# Patient Record
Sex: Male | Born: 1935 | Race: White | Hispanic: No | Marital: Married | Smoking: Former smoker
Health system: Southern US, Community
[De-identification: ages and names within clinical notes are randomized; demographics above are authoritative.]

## PROBLEM LIST (undated history)

## (undated) DIAGNOSIS — G894 Chronic pain syndrome: Secondary | ICD-10-CM

## (undated) DIAGNOSIS — K5790 Diverticulosis of intestine, part unspecified, without perforation or abscess without bleeding: Secondary | ICD-10-CM

## (undated) DIAGNOSIS — I679 Cerebrovascular disease, unspecified: Secondary | ICD-10-CM

## (undated) DIAGNOSIS — M47817 Spondylosis without myelopathy or radiculopathy, lumbosacral region: Secondary | ICD-10-CM

## (undated) DIAGNOSIS — N2889 Other specified disorders of kidney and ureter: Secondary | ICD-10-CM

## (undated) DIAGNOSIS — D649 Anemia, unspecified: Secondary | ICD-10-CM

## (undated) DIAGNOSIS — K219 Gastro-esophageal reflux disease without esophagitis: Secondary | ICD-10-CM

## (undated) DIAGNOSIS — N400 Enlarged prostate without lower urinary tract symptoms: Secondary | ICD-10-CM

## (undated) DIAGNOSIS — I739 Peripheral vascular disease, unspecified: Secondary | ICD-10-CM

## (undated) DIAGNOSIS — K449 Diaphragmatic hernia without obstruction or gangrene: Secondary | ICD-10-CM

## (undated) DIAGNOSIS — I8390 Asymptomatic varicose veins of unspecified lower extremity: Secondary | ICD-10-CM

## (undated) DIAGNOSIS — F329 Major depressive disorder, single episode, unspecified: Secondary | ICD-10-CM

## (undated) DIAGNOSIS — I639 Cerebral infarction, unspecified: Secondary | ICD-10-CM

## (undated) DIAGNOSIS — I10 Essential (primary) hypertension: Secondary | ICD-10-CM

## (undated) DIAGNOSIS — M545 Low back pain, unspecified: Secondary | ICD-10-CM

## (undated) DIAGNOSIS — I4510 Unspecified right bundle-branch block: Secondary | ICD-10-CM

## (undated) DIAGNOSIS — J449 Chronic obstructive pulmonary disease, unspecified: Secondary | ICD-10-CM

## (undated) DIAGNOSIS — F32A Depression, unspecified: Secondary | ICD-10-CM

## (undated) DIAGNOSIS — M1990 Unspecified osteoarthritis, unspecified site: Secondary | ICD-10-CM

## (undated) DIAGNOSIS — I4891 Unspecified atrial fibrillation: Secondary | ICD-10-CM

## (undated) DIAGNOSIS — N2581 Secondary hyperparathyroidism of renal origin: Secondary | ICD-10-CM

## (undated) DIAGNOSIS — E875 Hyperkalemia: Secondary | ICD-10-CM

## (undated) DIAGNOSIS — I5032 Chronic diastolic (congestive) heart failure: Secondary | ICD-10-CM

## (undated) DIAGNOSIS — E785 Hyperlipidemia, unspecified: Secondary | ICD-10-CM

## (undated) DIAGNOSIS — N259 Disorder resulting from impaired renal tubular function, unspecified: Secondary | ICD-10-CM

## (undated) DIAGNOSIS — N183 Chronic kidney disease, stage 3 (moderate): Secondary | ICD-10-CM

## (undated) HISTORY — DX: Asymptomatic varicose veins of unspecified lower extremity: I83.90

## (undated) HISTORY — DX: Chronic obstructive pulmonary disease, unspecified: J44.9

## (undated) HISTORY — DX: Low back pain, unspecified: M54.50

## (undated) HISTORY — DX: Low back pain: M54.5

## (undated) HISTORY — DX: Cerebrovascular disease, unspecified: I67.9

## (undated) HISTORY — DX: Unspecified osteoarthritis, unspecified site: M19.90

## (undated) HISTORY — DX: Peripheral vascular disease, unspecified: I73.9

## (undated) HISTORY — DX: Unspecified atrial fibrillation: I48.91

## (undated) HISTORY — DX: Diaphragmatic hernia without obstruction or gangrene: K44.9

## (undated) HISTORY — DX: Cerebral infarction, unspecified: I63.9

## (undated) HISTORY — DX: Spondylosis without myelopathy or radiculopathy, lumbosacral region: M47.817

## (undated) HISTORY — DX: Chronic pain syndrome: G89.4

## (undated) HISTORY — DX: Disorder resulting from impaired renal tubular function, unspecified: N25.9

## (undated) HISTORY — DX: Anemia, unspecified: D64.9

## (undated) HISTORY — DX: Essential (primary) hypertension: I10

## (undated) HISTORY — DX: Hyperkalemia: E87.5

## (undated) HISTORY — DX: Major depressive disorder, single episode, unspecified: F32.9

## (undated) HISTORY — DX: Hyperlipidemia, unspecified: E78.5

## (undated) HISTORY — DX: Depression, unspecified: F32.A

## (undated) HISTORY — DX: Gastro-esophageal reflux disease without esophagitis: K21.9

## (undated) HISTORY — DX: Secondary hyperparathyroidism of renal origin: N25.81

---

## 2001-12-05 ENCOUNTER — Encounter: Payer: Self-pay | Admitting: Gastroenterology

## 2002-08-12 ENCOUNTER — Encounter: Payer: Self-pay | Admitting: Endocrinology

## 2002-08-12 ENCOUNTER — Ambulatory Visit (HOSPITAL_COMMUNITY): Admission: RE | Admit: 2002-08-12 | Discharge: 2002-08-12 | Payer: Self-pay | Admitting: Endocrinology

## 2004-03-18 ENCOUNTER — Ambulatory Visit: Payer: Self-pay | Admitting: Endocrinology

## 2004-07-02 ENCOUNTER — Ambulatory Visit: Payer: Self-pay | Admitting: Internal Medicine

## 2004-07-19 ENCOUNTER — Ambulatory Visit: Payer: Self-pay | Admitting: Endocrinology

## 2004-07-23 ENCOUNTER — Ambulatory Visit: Payer: Self-pay | Admitting: Endocrinology

## 2004-08-09 ENCOUNTER — Ambulatory Visit: Payer: Self-pay | Admitting: Critical Care Medicine

## 2004-09-22 ENCOUNTER — Ambulatory Visit: Payer: Self-pay | Admitting: Critical Care Medicine

## 2004-09-22 LAB — PULMONARY FUNCTION TEST

## 2004-11-12 ENCOUNTER — Ambulatory Visit: Payer: Self-pay | Admitting: Endocrinology

## 2004-12-20 ENCOUNTER — Ambulatory Visit: Payer: Self-pay | Admitting: Endocrinology

## 2004-12-24 ENCOUNTER — Ambulatory Visit: Payer: Self-pay | Admitting: Endocrinology

## 2005-07-20 ENCOUNTER — Ambulatory Visit: Payer: Self-pay | Admitting: Endocrinology

## 2005-07-26 ENCOUNTER — Ambulatory Visit: Payer: Self-pay | Admitting: Endocrinology

## 2005-09-05 ENCOUNTER — Ambulatory Visit: Payer: Self-pay

## 2005-09-08 ENCOUNTER — Ambulatory Visit: Payer: Self-pay | Admitting: Cardiology

## 2005-09-18 ENCOUNTER — Inpatient Hospital Stay (HOSPITAL_COMMUNITY): Admission: EM | Admit: 2005-09-18 | Discharge: 2005-09-21 | Payer: Self-pay | Admitting: Emergency Medicine

## 2005-09-18 ENCOUNTER — Ambulatory Visit: Payer: Self-pay | Admitting: Internal Medicine

## 2005-09-19 ENCOUNTER — Encounter (INDEPENDENT_AMBULATORY_CARE_PROVIDER_SITE_OTHER): Payer: Self-pay | Admitting: Specialist

## 2005-09-19 ENCOUNTER — Encounter: Payer: Self-pay | Admitting: Internal Medicine

## 2005-09-22 ENCOUNTER — Ambulatory Visit: Payer: Self-pay | Admitting: Internal Medicine

## 2005-09-28 ENCOUNTER — Ambulatory Visit: Payer: Self-pay | Admitting: Endocrinology

## 2005-09-29 ENCOUNTER — Ambulatory Visit: Payer: Self-pay | Admitting: Cardiology

## 2005-10-10 ENCOUNTER — Ambulatory Visit: Payer: Self-pay | Admitting: Internal Medicine

## 2005-10-28 ENCOUNTER — Ambulatory Visit: Payer: Self-pay

## 2005-10-31 ENCOUNTER — Ambulatory Visit: Payer: Self-pay | Admitting: Endocrinology

## 2005-11-07 ENCOUNTER — Ambulatory Visit: Payer: Self-pay | Admitting: Cardiology

## 2005-11-14 ENCOUNTER — Ambulatory Visit: Payer: Self-pay | Admitting: Endocrinology

## 2005-12-02 ENCOUNTER — Ambulatory Visit: Payer: Self-pay | Admitting: Internal Medicine

## 2006-01-11 ENCOUNTER — Ambulatory Visit: Payer: Self-pay | Admitting: Endocrinology

## 2006-01-23 ENCOUNTER — Ambulatory Visit: Payer: Self-pay | Admitting: Endocrinology

## 2006-02-22 ENCOUNTER — Ambulatory Visit: Payer: Self-pay | Admitting: Endocrinology

## 2006-03-20 ENCOUNTER — Ambulatory Visit: Payer: Self-pay | Admitting: Internal Medicine

## 2006-03-27 ENCOUNTER — Ambulatory Visit: Payer: Self-pay | Admitting: Endocrinology

## 2006-05-08 ENCOUNTER — Ambulatory Visit: Payer: Self-pay | Admitting: Endocrinology

## 2006-07-31 ENCOUNTER — Ambulatory Visit: Payer: Self-pay | Admitting: Endocrinology

## 2006-07-31 LAB — CONVERTED CEMR LAB
Albumin: 4 g/dL (ref 3.5–5.2)
Alkaline Phosphatase: 71 units/L (ref 39–117)
BUN: 26 mg/dL — ABNORMAL HIGH (ref 6–23)
Basophils Absolute: 0 10*3/uL (ref 0.0–0.1)
Eosinophils Absolute: 0.4 10*3/uL (ref 0.0–0.6)
GFR calc non Af Amer: 57 mL/min
HCT: 36.2 % — ABNORMAL LOW (ref 39.0–52.0)
Iron: 59 ug/dL (ref 42–165)
MCHC: 35.2 g/dL (ref 30.0–36.0)
MCV: 95.9 fL (ref 78.0–100.0)
Monocytes Absolute: 0.7 10*3/uL (ref 0.2–0.7)
Monocytes Relative: 12.8 % — ABNORMAL HIGH (ref 3.0–11.0)
Neutrophils Relative %: 50.7 % (ref 43.0–77.0)
Potassium: 4.7 meq/L (ref 3.5–5.1)
RBC: 3.77 M/uL — ABNORMAL LOW (ref 4.22–5.81)
Sodium: 143 meq/L (ref 135–145)
TSH: 0.63 microintl units/mL (ref 0.35–5.50)
Vitamin B-12: 497 pg/mL (ref 211–911)

## 2006-08-10 ENCOUNTER — Ambulatory Visit: Payer: Self-pay

## 2006-08-15 ENCOUNTER — Encounter: Admission: RE | Admit: 2006-08-15 | Discharge: 2006-08-15 | Payer: Self-pay | Admitting: Orthopedic Surgery

## 2006-09-21 ENCOUNTER — Encounter: Admission: RE | Admit: 2006-09-21 | Discharge: 2006-09-21 | Payer: Self-pay | Admitting: Orthopedic Surgery

## 2006-10-16 ENCOUNTER — Ambulatory Visit: Payer: Self-pay | Admitting: Endocrinology

## 2006-11-03 ENCOUNTER — Ambulatory Visit: Payer: Self-pay | Admitting: Cardiology

## 2006-11-03 LAB — CONVERTED CEMR LAB
Basophils Absolute: 0 10*3/uL (ref 0.0–0.1)
Basophils Relative: 0.6 % (ref 0.0–1.0)
CO2: 27 meq/L (ref 19–32)
Chloride: 109 meq/L (ref 96–112)
Creatinine, Ser: 1.2 mg/dL (ref 0.4–1.5)
Eosinophils Relative: 9.9 % — ABNORMAL HIGH (ref 0.0–5.0)
Glucose, Bld: 101 mg/dL — ABNORMAL HIGH (ref 70–99)
HCT: 35.1 % — ABNORMAL LOW (ref 39.0–52.0)
Hemoglobin: 11.9 g/dL — ABNORMAL LOW (ref 13.0–17.0)
Neutrophils Relative %: 42.6 % — ABNORMAL LOW (ref 43.0–77.0)
RBC: 3.6 M/uL — ABNORMAL LOW (ref 4.22–5.81)
RDW: 12 % (ref 11.5–14.6)
Sodium: 140 meq/L (ref 135–145)
WBC: 5.1 10*3/uL (ref 4.5–10.5)

## 2006-11-20 DIAGNOSIS — I252 Old myocardial infarction: Secondary | ICD-10-CM

## 2006-11-20 DIAGNOSIS — I10 Essential (primary) hypertension: Secondary | ICD-10-CM | POA: Insufficient documentation

## 2006-11-20 DIAGNOSIS — I739 Peripheral vascular disease, unspecified: Secondary | ICD-10-CM

## 2006-11-20 DIAGNOSIS — J4489 Other specified chronic obstructive pulmonary disease: Secondary | ICD-10-CM | POA: Insufficient documentation

## 2006-11-20 DIAGNOSIS — J449 Chronic obstructive pulmonary disease, unspecified: Secondary | ICD-10-CM

## 2006-12-21 ENCOUNTER — Ambulatory Visit: Payer: Self-pay

## 2006-12-21 ENCOUNTER — Encounter: Payer: Self-pay | Admitting: Cardiology

## 2006-12-27 ENCOUNTER — Ambulatory Visit: Payer: Self-pay | Admitting: Cardiology

## 2006-12-28 ENCOUNTER — Ambulatory Visit: Payer: Self-pay | Admitting: Endocrinology

## 2006-12-29 ENCOUNTER — Ambulatory Visit: Payer: Self-pay | Admitting: Cardiology

## 2006-12-29 LAB — CONVERTED CEMR LAB
AST: 20 units/L (ref 0–37)
Albumin: 3.5 g/dL (ref 3.5–5.2)
BUN: 24 mg/dL — ABNORMAL HIGH (ref 6–23)
Chloride: 107 meq/L (ref 96–112)
Cholesterol: 185 mg/dL (ref 0–200)
Direct LDL: 78.6 mg/dL
GFR calc non Af Amer: 69 mL/min
Sodium: 139 meq/L (ref 135–145)
Total CHOL/HDL Ratio: 3.7
VLDL: 73 mg/dL — ABNORMAL HIGH (ref 0–40)

## 2007-02-05 ENCOUNTER — Ambulatory Visit: Payer: Self-pay | Admitting: Internal Medicine

## 2007-03-01 ENCOUNTER — Telehealth (INDEPENDENT_AMBULATORY_CARE_PROVIDER_SITE_OTHER): Payer: Self-pay | Admitting: *Deleted

## 2007-03-01 ENCOUNTER — Ambulatory Visit: Payer: Self-pay | Admitting: Endocrinology

## 2007-06-11 ENCOUNTER — Telehealth (INDEPENDENT_AMBULATORY_CARE_PROVIDER_SITE_OTHER): Payer: Self-pay | Admitting: *Deleted

## 2007-06-12 ENCOUNTER — Ambulatory Visit: Payer: Self-pay | Admitting: Cardiology

## 2007-06-14 ENCOUNTER — Ambulatory Visit: Payer: Self-pay | Admitting: Endocrinology

## 2007-06-14 ENCOUNTER — Ambulatory Visit: Payer: Self-pay | Admitting: Cardiology

## 2007-06-14 LAB — CONVERTED CEMR LAB
Bilirubin, Direct: 0.1 mg/dL (ref 0.0–0.3)
Cholesterol: 215 mg/dL (ref 0–200)
Direct LDL: 86.1 mg/dL
GFR calc Af Amer: 63 mL/min
GFR calc non Af Amer: 52 mL/min
Glucose, Bld: 102 mg/dL — ABNORMAL HIGH (ref 70–99)
Sodium: 141 meq/L (ref 135–145)
Total CHOL/HDL Ratio: 2.8
Triglycerides: 410 mg/dL (ref 0–149)

## 2007-06-25 ENCOUNTER — Ambulatory Visit: Payer: Self-pay | Admitting: Cardiovascular Disease

## 2007-07-06 ENCOUNTER — Telehealth: Payer: Self-pay | Admitting: Endocrinology

## 2007-08-13 ENCOUNTER — Ambulatory Visit: Payer: Self-pay | Admitting: Cardiology

## 2007-08-13 LAB — CONVERTED CEMR LAB
ALT: 11 units/L (ref 0–53)
AST: 17 units/L (ref 0–37)
Albumin: 3.4 g/dL — ABNORMAL LOW (ref 3.5–5.2)
HDL: 71.8 mg/dL (ref >39.0)
Triglycerides: 181 mg/dL — ABNORMAL HIGH (ref 0–149)

## 2007-08-20 ENCOUNTER — Ambulatory Visit: Payer: Self-pay

## 2007-09-26 ENCOUNTER — Ambulatory Visit: Payer: Self-pay | Admitting: Endocrinology

## 2007-09-26 DIAGNOSIS — R7309 Other abnormal glucose: Secondary | ICD-10-CM

## 2007-09-26 DIAGNOSIS — M25559 Pain in unspecified hip: Secondary | ICD-10-CM

## 2007-09-26 DIAGNOSIS — D649 Anemia, unspecified: Secondary | ICD-10-CM | POA: Insufficient documentation

## 2007-09-27 ENCOUNTER — Encounter (INDEPENDENT_AMBULATORY_CARE_PROVIDER_SITE_OTHER): Payer: Self-pay | Admitting: *Deleted

## 2007-10-12 ENCOUNTER — Encounter: Payer: Self-pay | Admitting: Endocrinology

## 2007-11-15 ENCOUNTER — Ambulatory Visit: Payer: Self-pay | Admitting: Cardiology

## 2007-11-15 LAB — CONVERTED CEMR LAB
Albumin: 3.7 g/dL (ref 3.5–5.2)
Alkaline Phosphatase: 68 units/L (ref 39–117)
Bilirubin, Direct: 0.1 mg/dL (ref 0.0–0.3)
HDL: 67.4 mg/dL (ref >39.0)
Total CHOL/HDL Ratio: 3.1
Total Protein: 6.9 g/dL (ref 6.0–8.3)
VLDL: 50 mg/dL — ABNORMAL HIGH (ref 0–40)

## 2007-11-22 ENCOUNTER — Ambulatory Visit: Payer: Self-pay | Admitting: Cardiovascular Disease

## 2008-01-08 HISTORY — PX: LUMBAR FUSION: SHX111

## 2008-01-18 ENCOUNTER — Ambulatory Visit: Payer: Self-pay | Admitting: Cardiology

## 2008-01-18 LAB — CONVERTED CEMR LAB
ALT: 15 units/L (ref 0–53)
AST: 20 units/L (ref 0–37)
Albumin: 3.9 g/dL (ref 3.5–5.2)
Cholesterol: 210 mg/dL (ref 0–200)
Direct LDL: 86.2 mg/dL
HDL: 72.7 mg/dL (ref >39.0)
Total Bilirubin: 0.6 mg/dL (ref 0.3–1.2)
Total CHOL/HDL Ratio: 2.9
Triglycerides: 371 mg/dL (ref 0–149)

## 2008-01-24 ENCOUNTER — Ambulatory Visit: Payer: Self-pay | Admitting: Cardiology

## 2008-02-07 DIAGNOSIS — I679 Cerebrovascular disease, unspecified: Secondary | ICD-10-CM

## 2008-02-07 HISTORY — DX: Cerebrovascular disease, unspecified: I67.9

## 2008-02-21 ENCOUNTER — Ambulatory Visit: Payer: Self-pay | Admitting: Internal Medicine

## 2008-02-21 ENCOUNTER — Ambulatory Visit: Payer: Self-pay | Admitting: Cardiology

## 2008-02-25 ENCOUNTER — Telehealth (INDEPENDENT_AMBULATORY_CARE_PROVIDER_SITE_OTHER): Payer: Self-pay | Admitting: *Deleted

## 2008-02-28 ENCOUNTER — Ambulatory Visit: Payer: Self-pay | Admitting: Endocrinology

## 2008-02-28 DIAGNOSIS — E7800 Pure hypercholesterolemia, unspecified: Secondary | ICD-10-CM | POA: Insufficient documentation

## 2008-02-28 DIAGNOSIS — M479 Spondylosis, unspecified: Secondary | ICD-10-CM | POA: Insufficient documentation

## 2008-03-06 ENCOUNTER — Ambulatory Visit: Payer: Self-pay | Admitting: Endocrinology

## 2008-03-08 LAB — CONVERTED CEMR LAB
Cholesterol: 163 mg/dL (ref 0–200)
VLDL: 35 mg/dL (ref 0–40)

## 2008-03-25 ENCOUNTER — Ambulatory Visit: Payer: Self-pay | Admitting: Cardiology

## 2008-03-25 LAB — CONVERTED CEMR LAB
BUN: 28 mg/dL — ABNORMAL HIGH (ref 6–23)
CO2: 23 meq/L (ref 19–32)
GFR calc Af Amer: 54 mL/min
Glucose, Bld: 92 mg/dL (ref 70–99)
Potassium: 4.7 meq/L (ref 3.5–5.1)
Sodium: 137 meq/L (ref 135–145)

## 2008-04-11 ENCOUNTER — Ambulatory Visit: Payer: Self-pay | Admitting: Internal Medicine

## 2008-04-11 DIAGNOSIS — N259 Disorder resulting from impaired renal tubular function, unspecified: Secondary | ICD-10-CM | POA: Insufficient documentation

## 2008-04-11 DIAGNOSIS — K219 Gastro-esophageal reflux disease without esophagitis: Secondary | ICD-10-CM

## 2008-04-14 ENCOUNTER — Telehealth (INDEPENDENT_AMBULATORY_CARE_PROVIDER_SITE_OTHER): Payer: Self-pay | Admitting: *Deleted

## 2008-04-14 LAB — CONVERTED CEMR LAB
Basophils Absolute: 0 10*3/uL (ref 0.0–0.1)
Basophils Relative: 0.9 % (ref 0.0–3.0)
CO2: 23 meq/L (ref 19–32)
Calcium: 9.6 mg/dL (ref 8.4–10.5)
Creatinine, Ser: 1.5 mg/dL (ref 0.4–1.5)
Eosinophils Absolute: 0.6 10*3/uL (ref 0.0–0.7)
GFR calc Af Amer: 58 mL/min
GFR calc non Af Amer: 48 mL/min
HCT: 32.6 % — ABNORMAL LOW (ref 39.0–52.0)
Hemoglobin: 11.5 g/dL — ABNORMAL LOW (ref 13.0–17.0)
Iron: 94 ug/dL (ref 42–165)
MCHC: 35.2 g/dL (ref 30.0–36.0)
MCV: 98.6 fL (ref 78.0–100.0)
Monocytes Absolute: 0.4 10*3/uL (ref 0.1–1.0)
Monocytes Relative: 10.4 % (ref 3.0–12.0)
Neutro Abs: 1.8 10*3/uL (ref 1.4–7.7)
Platelets: 153 10*3/uL (ref 150–400)
RBC: 3.31 M/uL — ABNORMAL LOW (ref 4.22–5.81)
RDW: 12.3 % (ref 11.5–14.6)
Sodium: 138 meq/L (ref 135–145)
Transferrin: 221.3 mg/dL (ref >=212.0)
Vitamin B-12: 464 pg/mL (ref 211–911)

## 2008-06-02 ENCOUNTER — Telehealth (INDEPENDENT_AMBULATORY_CARE_PROVIDER_SITE_OTHER): Payer: Self-pay | Admitting: *Deleted

## 2008-06-03 ENCOUNTER — Encounter: Admission: RE | Admit: 2008-06-03 | Discharge: 2008-06-03 | Payer: Self-pay | Admitting: Neurosurgery

## 2008-06-24 ENCOUNTER — Ambulatory Visit: Payer: Self-pay | Admitting: Cardiology

## 2008-06-26 ENCOUNTER — Ambulatory Visit: Payer: Self-pay | Admitting: Cardiology

## 2008-06-30 ENCOUNTER — Ambulatory Visit: Payer: Self-pay | Admitting: Endocrinology

## 2008-07-22 ENCOUNTER — Ambulatory Visit: Payer: Self-pay | Admitting: Pulmonary Disease

## 2008-07-22 ENCOUNTER — Ambulatory Visit: Payer: Self-pay | Admitting: Internal Medicine

## 2008-07-22 ENCOUNTER — Inpatient Hospital Stay (HOSPITAL_COMMUNITY): Admission: RE | Admit: 2008-07-22 | Discharge: 2008-08-08 | Payer: Self-pay | Admitting: Neurosurgery

## 2008-07-28 ENCOUNTER — Encounter: Payer: Self-pay | Admitting: Internal Medicine

## 2008-08-20 ENCOUNTER — Telehealth: Payer: Self-pay | Admitting: Internal Medicine

## 2008-08-21 ENCOUNTER — Encounter: Payer: Self-pay | Admitting: Physician Assistant

## 2008-08-21 ENCOUNTER — Ambulatory Visit: Payer: Self-pay | Admitting: Internal Medicine

## 2008-08-21 DIAGNOSIS — K560 Paralytic ileus: Secondary | ICD-10-CM

## 2008-08-21 DIAGNOSIS — R197 Diarrhea, unspecified: Secondary | ICD-10-CM | POA: Insufficient documentation

## 2008-08-21 DIAGNOSIS — I4891 Unspecified atrial fibrillation: Secondary | ICD-10-CM | POA: Insufficient documentation

## 2008-08-21 DIAGNOSIS — D638 Anemia in other chronic diseases classified elsewhere: Secondary | ICD-10-CM | POA: Insufficient documentation

## 2008-08-22 LAB — CONVERTED CEMR LAB
BUN: 10 mg/dL (ref 6–23)
CO2: 29 meq/L (ref 19–32)
Chloride: 106 meq/L (ref 96–112)
Magnesium: 1.8 mg/dL (ref 1.5–2.5)
Phosphorus: 2.6 mg/dL (ref 2.3–4.6)
Potassium: 3.5 meq/L (ref 3.5–5.1)

## 2008-08-25 ENCOUNTER — Telehealth: Payer: Self-pay | Admitting: Physician Assistant

## 2008-08-27 ENCOUNTER — Ambulatory Visit: Payer: Self-pay | Admitting: Internal Medicine

## 2008-08-28 ENCOUNTER — Ambulatory Visit: Payer: Self-pay | Admitting: Internal Medicine

## 2008-08-28 ENCOUNTER — Ambulatory Visit: Payer: Self-pay | Admitting: Endocrinology

## 2008-08-28 ENCOUNTER — Telehealth (INDEPENDENT_AMBULATORY_CARE_PROVIDER_SITE_OTHER): Payer: Self-pay

## 2008-08-28 ENCOUNTER — Encounter: Payer: Self-pay | Admitting: Internal Medicine

## 2008-08-28 DIAGNOSIS — R609 Edema, unspecified: Secondary | ICD-10-CM

## 2008-08-28 DIAGNOSIS — D509 Iron deficiency anemia, unspecified: Secondary | ICD-10-CM

## 2008-08-28 LAB — CONVERTED CEMR LAB
BUN: 12 mg/dL (ref 6–23)
CO2: 28 meq/L (ref 19–32)
Chloride: 106 meq/L (ref 96–112)
Eosinophils Relative: 5.7 % — ABNORMAL HIGH (ref 0.0–5.0)
HCT: 28.2 % — ABNORMAL LOW (ref 39.0–52.0)
Hemoglobin: 9.5 g/dL — ABNORMAL LOW (ref 13.0–17.0)
Lymphs Abs: 2.2 10*3/uL (ref 0.7–4.0)
MCV: 94.5 fL (ref 78.0–100.0)
Monocytes Relative: 9.4 % (ref 3.0–12.0)
Neutro Abs: 4.5 10*3/uL (ref 1.4–7.7)
Potassium: 3.6 meq/L (ref 3.5–5.1)
Pro B Natriuretic peptide (BNP): 229 pg/mL — ABNORMAL HIGH (ref 0.0–100.0)
TSH: 1.59 microintl units/mL (ref 0.35–5.50)
WBC: 8.1 10*3/uL (ref 4.5–10.5)

## 2008-08-28 LAB — HM SIGMOIDOSCOPY

## 2008-09-02 ENCOUNTER — Telehealth: Payer: Self-pay | Admitting: Cardiology

## 2008-09-03 DIAGNOSIS — M810 Age-related osteoporosis without current pathological fracture: Secondary | ICD-10-CM | POA: Insufficient documentation

## 2008-09-03 DIAGNOSIS — E785 Hyperlipidemia, unspecified: Secondary | ICD-10-CM

## 2008-09-03 DIAGNOSIS — K449 Diaphragmatic hernia without obstruction or gangrene: Secondary | ICD-10-CM | POA: Insufficient documentation

## 2008-09-03 DIAGNOSIS — I679 Cerebrovascular disease, unspecified: Secondary | ICD-10-CM

## 2008-09-05 ENCOUNTER — Ambulatory Visit: Payer: Self-pay | Admitting: Cardiology

## 2008-09-05 ENCOUNTER — Ambulatory Visit: Payer: Self-pay

## 2008-09-11 ENCOUNTER — Ambulatory Visit: Payer: Self-pay | Admitting: Endocrinology

## 2008-09-11 DIAGNOSIS — N2581 Secondary hyperparathyroidism of renal origin: Secondary | ICD-10-CM | POA: Insufficient documentation

## 2008-09-11 LAB — CONVERTED CEMR LAB
CO2: 28 meq/L (ref 19–32)
Calcium, Total (PTH): 9.1 mg/dL (ref 8.4–10.5)
Eosinophils Relative: 8.6 % — ABNORMAL HIGH (ref 0.0–5.0)
GFR calc non Af Amer: 51.71 mL/min (ref >60)
Glucose, Bld: 85 mg/dL (ref 70–99)
Iron: 42 ug/dL (ref 42–165)
Lymphocytes Relative: 37.8 % (ref 12.0–46.0)
Monocytes Absolute: 0.5 10*3/uL (ref 0.1–1.0)
Monocytes Relative: 9.2 % (ref 3.0–12.0)
Neutrophils Relative %: 43.7 % (ref 43.0–77.0)
Platelets: 250 10*3/uL (ref 150.0–400.0)
Potassium: 5.4 meq/L — ABNORMAL HIGH (ref 3.5–5.1)
Saturation Ratios: 20.5 % (ref 20.0–50.0)
Sodium: 138 meq/L (ref 135–145)
Transferrin: 146.5 mg/dL — ABNORMAL LOW (ref 212.0–360.0)
Vit D, 25-Hydroxy: 30 ng/mL (ref 30–89)
WBC: 5.5 10*3/uL (ref 4.5–10.5)

## 2008-09-18 ENCOUNTER — Ambulatory Visit: Payer: Self-pay | Admitting: Internal Medicine

## 2008-09-25 ENCOUNTER — Ambulatory Visit: Payer: Self-pay | Admitting: Endocrinology

## 2008-09-26 ENCOUNTER — Telehealth (INDEPENDENT_AMBULATORY_CARE_PROVIDER_SITE_OTHER): Payer: Self-pay | Admitting: *Deleted

## 2008-10-01 ENCOUNTER — Encounter: Payer: Self-pay | Admitting: Cardiology

## 2008-10-15 ENCOUNTER — Telehealth: Payer: Self-pay | Admitting: Endocrinology

## 2008-10-16 ENCOUNTER — Telehealth (INDEPENDENT_AMBULATORY_CARE_PROVIDER_SITE_OTHER): Payer: Self-pay | Admitting: *Deleted

## 2008-10-16 ENCOUNTER — Telehealth: Payer: Self-pay | Admitting: Endocrinology

## 2008-10-17 ENCOUNTER — Encounter (INDEPENDENT_AMBULATORY_CARE_PROVIDER_SITE_OTHER): Payer: Self-pay | Admitting: *Deleted

## 2008-10-21 ENCOUNTER — Telehealth: Payer: Self-pay | Admitting: Internal Medicine

## 2008-11-05 ENCOUNTER — Ambulatory Visit: Payer: Self-pay | Admitting: Endocrinology

## 2008-11-05 LAB — CONVERTED CEMR LAB
Basophils Relative: 0.7 % (ref 0.0–3.0)
Calcium: 9.1 mg/dL (ref 8.4–10.5)
Chloride: 111 meq/L (ref 96–112)
Creatinine, Ser: 1.7 mg/dL — ABNORMAL HIGH (ref 0.4–1.5)
Eosinophils Relative: 11.7 % — ABNORMAL HIGH (ref 0.0–5.0)
Iron: 65 ug/dL (ref 42–165)
Lymphocytes Relative: 45.5 % (ref 12.0–46.0)
MCV: 92.5 fL (ref 78.0–100.0)
Monocytes Relative: 11.2 % (ref 3.0–12.0)
Neutrophils Relative %: 30.9 % — ABNORMAL LOW (ref 43.0–77.0)
RBC: 3.31 M/uL — ABNORMAL LOW (ref 4.22–5.81)
Saturation Ratios: 24.6 % (ref 20.0–50.0)
Transferrin: 188.5 mg/dL — ABNORMAL LOW (ref 212.0–360.0)
WBC: 4.7 10*3/uL (ref 4.5–10.5)

## 2008-11-12 ENCOUNTER — Ambulatory Visit: Payer: Self-pay | Admitting: Endocrinology

## 2008-11-18 ENCOUNTER — Ambulatory Visit: Payer: Self-pay | Admitting: Endocrinology

## 2008-11-18 ENCOUNTER — Telehealth (INDEPENDENT_AMBULATORY_CARE_PROVIDER_SITE_OTHER): Payer: Self-pay | Admitting: *Deleted

## 2008-11-18 ENCOUNTER — Telehealth: Payer: Self-pay | Admitting: Internal Medicine

## 2008-11-28 ENCOUNTER — Telehealth (INDEPENDENT_AMBULATORY_CARE_PROVIDER_SITE_OTHER): Payer: Self-pay | Admitting: *Deleted

## 2008-12-01 ENCOUNTER — Encounter: Payer: Self-pay | Admitting: Cardiology

## 2008-12-08 ENCOUNTER — Telehealth (INDEPENDENT_AMBULATORY_CARE_PROVIDER_SITE_OTHER): Payer: Self-pay | Admitting: *Deleted

## 2008-12-08 ENCOUNTER — Encounter: Payer: Self-pay | Admitting: Endocrinology

## 2008-12-15 ENCOUNTER — Ambulatory Visit: Payer: Self-pay | Admitting: Internal Medicine

## 2008-12-23 ENCOUNTER — Encounter: Payer: Self-pay | Admitting: Endocrinology

## 2008-12-23 ENCOUNTER — Encounter: Payer: Self-pay | Admitting: Internal Medicine

## 2008-12-23 LAB — CBC & DIFF AND RETIC
Basophils Absolute: 0 10*3/uL (ref 0.0–0.1)
EOS%: 2.7 % (ref 0.0–7.0)
Eosinophils Absolute: 0.2 10*3/uL (ref 0.0–0.5)
HCT: 32 % — ABNORMAL LOW (ref 38.4–49.9)
HGB: 10.8 g/dL — ABNORMAL LOW (ref 13.0–17.1)
LYMPH%: 35.4 % (ref 14.0–49.0)
MCH: 30.9 pg (ref 27.2–33.4)
MCV: 91.4 fL (ref 79.3–98.0)
MONO%: 7.3 % (ref 0.0–14.0)
NEUT#: 3 10*3/uL (ref 1.5–6.5)
NEUT%: 54.4 % (ref 39.0–75.0)
Platelets: 159 10*3/uL (ref 140–400)
Retic Ct Abs: 47.6 10*3/uL (ref 24.10–77.50)

## 2008-12-25 LAB — COMPREHENSIVE METABOLIC PANEL
ALT: 10 U/L (ref 0–53)
AST: 16 U/L (ref 0–37)
Albumin: 4.1 g/dL (ref 3.5–5.2)
BUN: 35 mg/dL — ABNORMAL HIGH (ref 6–23)
CO2: 19 mEq/L (ref 19–32)
Calcium: 9.5 mg/dL (ref 8.4–10.5)
Chloride: 108 mEq/L (ref 96–112)
Creatinine, Ser: 1.73 mg/dL — ABNORMAL HIGH (ref 0.40–1.50)
Potassium: 4.9 mEq/L (ref 3.5–5.3)

## 2008-12-25 LAB — PROTEIN ELECTROPHORESIS, SERUM
Alpha-1-Globulin: 5.4 % — ABNORMAL HIGH (ref 2.9–4.9)
Alpha-2-Globulin: 13.5 % — ABNORMAL HIGH (ref 7.1–11.8)
Beta 2: 5.2 % (ref 3.2–6.5)
Gamma Globulin: 10.4 % — ABNORMAL LOW (ref 11.1–18.8)

## 2008-12-25 LAB — LACTATE DEHYDROGENASE: LDH: 124 U/L (ref 94–250)

## 2008-12-25 LAB — ERYTHROPOIETIN: Erythropoietin: 15.7 m[IU]/mL (ref 2.6–34.0)

## 2008-12-25 LAB — VITAMIN B12: Vitamin B-12: 425 pg/mL (ref 211–911)

## 2008-12-25 LAB — IRON AND TIBC
%SAT: 25 % (ref 20–55)
TIBC: 272 ug/dL (ref 215–435)

## 2008-12-25 LAB — FERRITIN: Ferritin: 378 ng/mL — ABNORMAL HIGH (ref 22–322)

## 2009-01-06 ENCOUNTER — Encounter: Payer: Self-pay | Admitting: Endocrinology

## 2009-01-06 LAB — CBC WITH DIFFERENTIAL/PLATELET
BASO%: 0.4 % (ref 0.0–2.0)
Basophils Absolute: 0 10*3/uL (ref 0.0–0.1)
EOS%: 7.5 % — ABNORMAL HIGH (ref 0.0–7.0)
HCT: 30.3 % — ABNORMAL LOW (ref 38.4–49.9)
HGB: 10.3 g/dL — ABNORMAL LOW (ref 13.0–17.1)
MCH: 32.4 pg (ref 27.2–33.4)
MCHC: 34 g/dL (ref 32.0–36.0)
MCV: 95.3 fL (ref 79.3–98.0)
MONO%: 9.4 % (ref 0.0–14.0)
NEUT%: 50.3 % (ref 39.0–75.0)

## 2009-01-22 ENCOUNTER — Ambulatory Visit: Payer: Self-pay | Admitting: Cardiology

## 2009-02-27 ENCOUNTER — Ambulatory Visit: Payer: Self-pay | Admitting: Internal Medicine

## 2009-03-03 ENCOUNTER — Encounter: Payer: Self-pay | Admitting: Internal Medicine

## 2009-03-03 LAB — CBC WITH DIFFERENTIAL/PLATELET
BASO%: 0.5 % (ref 0.0–2.0)
Basophils Absolute: 0 10*3/uL (ref 0.0–0.1)
Eosinophils Absolute: 0.5 10*3/uL (ref 0.0–0.5)
HCT: 32 % — ABNORMAL LOW (ref 38.4–49.9)
HGB: 10.9 g/dL — ABNORMAL LOW (ref 13.0–17.1)
LYMPH%: 34.7 % (ref 14.0–49.0)
MCHC: 34.1 g/dL (ref 32.0–36.0)
MONO#: 0.5 10*3/uL (ref 0.1–0.9)
NEUT%: 47.5 % (ref 39.0–75.0)
Platelets: 190 10*3/uL (ref 140–400)
WBC: 5.8 10*3/uL (ref 4.0–10.3)
lymph#: 2 10*3/uL (ref 0.9–3.3)

## 2009-03-09 ENCOUNTER — Encounter: Payer: Self-pay | Admitting: Cardiology

## 2009-03-10 ENCOUNTER — Ambulatory Visit: Payer: Self-pay

## 2009-03-10 ENCOUNTER — Ambulatory Visit: Payer: Self-pay | Admitting: Cardiology

## 2009-03-11 ENCOUNTER — Encounter: Payer: Self-pay | Admitting: Cardiology

## 2009-03-18 ENCOUNTER — Encounter: Payer: Self-pay | Admitting: Endocrinology

## 2009-04-28 ENCOUNTER — Encounter: Payer: Self-pay | Admitting: Cardiology

## 2009-06-04 ENCOUNTER — Ambulatory Visit: Payer: Self-pay | Admitting: Internal Medicine

## 2009-06-08 ENCOUNTER — Encounter: Payer: Self-pay | Admitting: Endocrinology

## 2009-06-08 LAB — CBC WITH DIFFERENTIAL/PLATELET
Eosinophils Absolute: 0 10*3/uL (ref 0.0–0.5)
HCT: 32 % — ABNORMAL LOW (ref 38.4–49.9)
LYMPH%: 13.9 % — ABNORMAL LOW (ref 14.0–49.0)
MONO#: 1 10*3/uL — ABNORMAL HIGH (ref 0.1–0.9)
NEUT#: 7.4 10*3/uL — ABNORMAL HIGH (ref 1.5–6.5)
NEUT%: 74.5 % (ref 39.0–75.0)
Platelets: 233 10*3/uL (ref 140–400)
WBC: 9.9 10*3/uL (ref 4.0–10.3)

## 2009-06-12 ENCOUNTER — Other Ambulatory Visit: Admission: RE | Admit: 2009-06-12 | Discharge: 2009-06-12 | Payer: Self-pay | Admitting: Internal Medicine

## 2009-06-12 LAB — CBC WITH DIFFERENTIAL/PLATELET
Basophils Absolute: 0 10*3/uL (ref 0.0–0.1)
Eosinophils Absolute: 0 10*3/uL (ref 0.0–0.5)
HGB: 11 g/dL — ABNORMAL LOW (ref 13.0–17.1)
LYMPH%: 24.4 % (ref 14.0–49.0)
MCV: 101.6 fL — ABNORMAL HIGH (ref 79.3–98.0)
MONO#: 0.3 10*3/uL (ref 0.1–0.9)
MONO%: 4.7 % (ref 0.0–14.0)
NEUT#: 4.8 10*3/uL (ref 1.5–6.5)
Platelets: 297 10*3/uL (ref 140–400)
RBC: 3.21 10*6/uL — ABNORMAL LOW (ref 4.20–5.82)
RDW: 13.9 % (ref 11.0–14.6)
WBC: 6.8 10*3/uL (ref 4.0–10.3)

## 2009-06-22 ENCOUNTER — Encounter: Payer: Self-pay | Admitting: Endocrinology

## 2009-06-22 ENCOUNTER — Telehealth: Payer: Self-pay | Admitting: Endocrinology

## 2009-06-23 ENCOUNTER — Ambulatory Visit: Payer: Self-pay | Admitting: Endocrinology

## 2009-06-23 DIAGNOSIS — L408 Other psoriasis: Secondary | ICD-10-CM

## 2009-06-23 DIAGNOSIS — E875 Hyperkalemia: Secondary | ICD-10-CM

## 2009-06-23 DIAGNOSIS — R630 Anorexia: Secondary | ICD-10-CM

## 2009-06-23 LAB — CONVERTED CEMR LAB
Amylase: 77 units/L (ref 27–131)
Bilirubin Urine: NEGATIVE
Bilirubin, Direct: 0.2 mg/dL (ref 0.0–0.3)
CO2: 23 meq/L (ref 19–32)
Calcium: 9.2 mg/dL (ref 8.4–10.5)
Creatinine, Ser: 2.5 mg/dL — ABNORMAL HIGH (ref 0.4–1.5)
Direct LDL: 57.1 mg/dL
GFR calc non Af Amer: 26.43 mL/min (ref >60)
Hgb A1c MFr Bld: 6.3 % (ref 4.6–6.5)
Ketones, ur: NEGATIVE mg/dL
Leukocytes, UA: NEGATIVE
Nitrite: NEGATIVE
PTH: 79.2 pg/mL — ABNORMAL HIGH (ref 14.0–72.0)
Sodium: 136 meq/L (ref 135–145)
Specific Gravity, Urine: >=1.03 (ref 1.000–1.030)
Total CHOL/HDL Ratio: 2
Total Protein: 6.6 g/dL (ref 6.0–8.3)
Urobilinogen, UA: 0.2 (ref 0.0–1.0)
VLDL: 40.8 mg/dL — ABNORMAL HIGH (ref 0.0–40.0)

## 2009-07-01 ENCOUNTER — Encounter: Payer: Self-pay | Admitting: Endocrinology

## 2009-07-02 ENCOUNTER — Ambulatory Visit: Payer: Self-pay | Admitting: Endocrinology

## 2009-07-02 ENCOUNTER — Telehealth (INDEPENDENT_AMBULATORY_CARE_PROVIDER_SITE_OTHER): Payer: Self-pay | Admitting: *Deleted

## 2009-07-02 DIAGNOSIS — E079 Disorder of thyroid, unspecified: Secondary | ICD-10-CM | POA: Insufficient documentation

## 2009-07-09 ENCOUNTER — Encounter: Admission: RE | Admit: 2009-07-09 | Discharge: 2009-07-09 | Payer: Self-pay | Admitting: Endocrinology

## 2009-07-23 ENCOUNTER — Encounter: Payer: Self-pay | Admitting: Cardiovascular Disease

## 2009-07-24 ENCOUNTER — Encounter: Payer: Self-pay | Admitting: Cardiovascular Disease

## 2009-07-24 ENCOUNTER — Ambulatory Visit: Payer: Self-pay

## 2009-07-27 ENCOUNTER — Telehealth: Payer: Self-pay | Admitting: Endocrinology

## 2009-07-28 ENCOUNTER — Encounter: Payer: Self-pay | Admitting: Endocrinology

## 2009-07-28 ENCOUNTER — Telehealth: Payer: Self-pay | Admitting: Cardiology

## 2009-07-28 LAB — CONVERTED CEMR LAB
BUN: 18 mg/dL
GFR calc Af Amer: 59 mL/min
GFR calc non Af Amer: 49 mL/min
Glucose, Bld: 90 mg/dL

## 2009-07-30 ENCOUNTER — Encounter: Payer: Self-pay | Admitting: Endocrinology

## 2009-07-31 ENCOUNTER — Encounter: Payer: Self-pay | Admitting: Cardiovascular Disease

## 2009-08-03 ENCOUNTER — Telehealth: Payer: Self-pay | Admitting: Endocrinology

## 2009-08-04 ENCOUNTER — Ambulatory Visit: Payer: Self-pay | Admitting: Endocrinology

## 2009-09-02 ENCOUNTER — Telehealth: Payer: Self-pay | Admitting: Endocrinology

## 2009-09-11 ENCOUNTER — Ambulatory Visit: Payer: Self-pay | Admitting: Internal Medicine

## 2009-09-14 LAB — CBC WITH DIFFERENTIAL/PLATELET
BASO%: 0.3 % (ref 0.0–2.0)
EOS%: 5.5 % (ref 0.0–7.0)
HCT: 33.9 % — ABNORMAL LOW (ref 38.4–49.9)
LYMPH%: 23.7 % (ref 14.0–49.0)
MCH: 34.5 pg — ABNORMAL HIGH (ref 27.2–33.4)
MCHC: 33.8 g/dL (ref 32.0–36.0)
MONO#: 0.7 10*3/uL (ref 0.1–0.9)
NEUT%: 59.4 % (ref 39.0–75.0)
Platelets: 213 10*3/uL (ref 140–400)
RBC: 3.33 10*6/uL — ABNORMAL LOW (ref 4.20–5.82)
WBC: 6.4 10*3/uL (ref 4.0–10.3)

## 2009-09-14 LAB — COMPREHENSIVE METABOLIC PANEL
ALT: 11 U/L (ref 0–53)
AST: 19 U/L (ref 0–37)
Alkaline Phosphatase: 85 U/L (ref 39–117)
Creatinine, Ser: 1.52 mg/dL — ABNORMAL HIGH (ref 0.40–1.50)
Sodium: 138 mEq/L (ref 135–145)
Total Bilirubin: 0.5 mg/dL (ref 0.3–1.2)
Total Protein: 6.4 g/dL (ref 6.0–8.3)

## 2009-09-14 LAB — IRON AND TIBC: %SAT: 28 % (ref 20–55)

## 2009-09-14 LAB — FERRITIN: Ferritin: 590 ng/mL — ABNORMAL HIGH (ref 22–322)

## 2009-09-21 ENCOUNTER — Encounter: Payer: Self-pay | Admitting: Endocrinology

## 2009-10-01 ENCOUNTER — Ambulatory Visit: Payer: Self-pay | Admitting: Endocrinology

## 2009-10-12 ENCOUNTER — Telehealth: Payer: Self-pay | Admitting: Endocrinology

## 2009-10-12 ENCOUNTER — Ambulatory Visit: Payer: Self-pay | Admitting: Endocrinology

## 2009-10-12 DIAGNOSIS — F329 Major depressive disorder, single episode, unspecified: Secondary | ICD-10-CM

## 2009-10-12 DIAGNOSIS — N320 Bladder-neck obstruction: Secondary | ICD-10-CM

## 2009-10-12 LAB — CONVERTED CEMR LAB
Leukocytes, UA: NEGATIVE
Nitrite: NEGATIVE
Specific Gravity, Urine: 1.025 (ref 1.000–1.030)
Urobilinogen, UA: 0.2 (ref 0.0–1.0)

## 2009-10-21 ENCOUNTER — Telehealth: Payer: Self-pay | Admitting: Internal Medicine

## 2009-11-02 ENCOUNTER — Ambulatory Visit: Payer: Self-pay | Admitting: Cardiology

## 2009-11-06 ENCOUNTER — Encounter: Payer: Self-pay | Admitting: Cardiology

## 2009-11-06 ENCOUNTER — Ambulatory Visit: Payer: Self-pay

## 2009-12-10 ENCOUNTER — Telehealth: Payer: Self-pay | Admitting: Internal Medicine

## 2010-01-18 ENCOUNTER — Ambulatory Visit: Payer: Self-pay | Admitting: Internal Medicine

## 2010-01-18 LAB — CONVERTED CEMR LAB
ALT: 13 units/L (ref 0–53)
AST: 23 units/L (ref 0–37)
Albumin: 3.8 g/dL (ref 3.5–5.2)
Basophils Absolute: 0 10*3/uL (ref 0.0–0.1)
Basophils Relative: 0.5 % (ref 0.0–3.0)
Calcium: 9.4 mg/dL (ref 8.4–10.5)
Chloride: 107 meq/L (ref 96–112)
Eosinophils Absolute: 0.5 10*3/uL (ref 0.0–0.7)
Lymphocytes Relative: 36.4 % (ref 12.0–46.0)
MCHC: 34.8 g/dL (ref 30.0–36.0)
Neutrophils Relative %: 40.8 % — ABNORMAL LOW (ref 43.0–77.0)
Potassium: 5.3 meq/L — ABNORMAL HIGH (ref 3.5–5.1)
RBC: 3.21 M/uL — ABNORMAL LOW (ref 4.22–5.81)
RDW: 13.8 % (ref 11.5–14.6)
Total Protein: 6.6 g/dL (ref 6.0–8.3)

## 2010-01-19 LAB — CONVERTED CEMR LAB
Saturation Ratios: 28 % (ref 20.0–50.0)
Transferrin: 219.3 mg/dL (ref 212.0–360.0)

## 2010-01-21 ENCOUNTER — Telehealth: Payer: Self-pay | Admitting: Internal Medicine

## 2010-02-01 ENCOUNTER — Telehealth: Payer: Self-pay | Admitting: Internal Medicine

## 2010-02-22 ENCOUNTER — Encounter: Payer: Self-pay | Admitting: Cardiology

## 2010-03-03 ENCOUNTER — Ambulatory Visit: Payer: Self-pay | Admitting: Internal Medicine

## 2010-03-03 DIAGNOSIS — R634 Abnormal weight loss: Secondary | ICD-10-CM | POA: Insufficient documentation

## 2010-03-11 ENCOUNTER — Telehealth: Payer: Self-pay | Admitting: Internal Medicine

## 2010-03-16 ENCOUNTER — Other Ambulatory Visit: Payer: Self-pay | Admitting: Internal Medicine

## 2010-03-16 LAB — IRON AND TIBC: TIBC: 258 ug/dL (ref 215–435)

## 2010-03-16 LAB — COMPREHENSIVE METABOLIC PANEL
AST: 17 U/L (ref 0–37)
BUN: 18 mg/dL (ref 6–23)
Calcium: 8.8 mg/dL (ref 8.4–10.5)
Chloride: 107 mEq/L (ref 96–112)
Creatinine, Ser: 1.46 mg/dL (ref 0.40–1.50)
Total Bilirubin: 0.3 mg/dL (ref 0.3–1.2)

## 2010-03-16 LAB — CBC WITH DIFFERENTIAL/PLATELET
BASO%: 0.3 % (ref 0.0–2.0)
Basophils Absolute: 0 10*3/uL (ref 0.0–0.1)
EOS%: 12.3 % — ABNORMAL HIGH (ref 0.0–7.0)
HCT: 29.4 % — ABNORMAL LOW (ref 38.4–49.9)
HGB: 10.2 g/dL — ABNORMAL LOW (ref 13.0–17.1)
LYMPH%: 37.3 % (ref 14.0–49.0)
MCH: 35.1 pg — ABNORMAL HIGH (ref 27.2–33.4)
MCHC: 34.5 g/dL (ref 32.0–36.0)
MCV: 101.6 fL — ABNORMAL HIGH (ref 79.3–98.0)
MONO%: 14.1 % — ABNORMAL HIGH (ref 0.0–14.0)
NEUT%: 36 % — ABNORMAL LOW (ref 39.0–75.0)
Platelets: 153 10*3/uL (ref 140–400)
lymph#: 1.4 10*3/uL (ref 0.9–3.3)

## 2010-03-23 ENCOUNTER — Encounter: Payer: Self-pay | Admitting: Endocrinology

## 2010-03-24 ENCOUNTER — Telehealth: Payer: Self-pay | Admitting: Endocrinology

## 2010-04-13 ENCOUNTER — Other Ambulatory Visit: Payer: Self-pay | Admitting: Internal Medicine

## 2010-04-13 LAB — CBC WITH DIFFERENTIAL/PLATELET
Basophils Absolute: 0 10*3/uL (ref 0.0–0.1)
Eosinophils Absolute: 0.7 10*3/uL — ABNORMAL HIGH (ref 0.0–0.5)
HCT: 32.8 % — ABNORMAL LOW (ref 38.4–49.9)
HGB: 10.8 g/dL — ABNORMAL LOW (ref 13.0–17.1)
LYMPH%: 35.8 % (ref 14.0–49.0)
MCV: 102.3 fL — ABNORMAL HIGH (ref 79.3–98.0)
MONO#: 0.8 10*3/uL (ref 0.1–0.9)
MONO%: 14.6 % — ABNORMAL HIGH (ref 0.0–14.0)
NEUT#: 1.9 10*3/uL (ref 1.5–6.5)
NEUT%: 36.3 % — ABNORMAL LOW (ref 39.0–75.0)
Platelets: 163 10*3/uL (ref 140–400)
RBC: 3.21 10*6/uL — ABNORMAL LOW (ref 4.20–5.82)
WBC: 5.3 10*3/uL (ref 4.0–10.3)

## 2010-04-21 ENCOUNTER — Encounter: Payer: Self-pay | Admitting: Cardiology

## 2010-04-29 ENCOUNTER — Ambulatory Visit: Payer: Self-pay | Admitting: Internal Medicine

## 2010-05-05 ENCOUNTER — Telehealth: Payer: Self-pay | Admitting: Internal Medicine

## 2010-05-05 LAB — CBC WITH DIFFERENTIAL/PLATELET
Eosinophils Absolute: 0.6 10*3/uL — ABNORMAL HIGH (ref 0.0–0.5)
HCT: 31.3 % — ABNORMAL LOW (ref 38.4–49.9)
LYMPH%: 31.5 % (ref 14.0–49.0)
MCV: 99.7 fL — ABNORMAL HIGH (ref 79.3–98.0)
MONO#: 1.4 10*3/uL — ABNORMAL HIGH (ref 0.1–0.9)
MONO%: 21.2 % — ABNORMAL HIGH (ref 0.0–14.0)
NEUT#: 2.5 10*3/uL (ref 1.5–6.5)
NEUT%: 37.7 % — ABNORMAL LOW (ref 39.0–75.0)
Platelets: 114 10*3/uL — ABNORMAL LOW (ref 140–400)
RBC: 3.14 10*6/uL — ABNORMAL LOW (ref 4.20–5.82)
WBC: 6.6 10*3/uL (ref 4.0–10.3)

## 2010-05-25 LAB — CBC WITH DIFFERENTIAL/PLATELET
BASO%: 0.2 % (ref 0.0–2.0)
Basophils Absolute: 0 10*3/uL (ref 0.0–0.1)
EOS%: 13.7 % — ABNORMAL HIGH (ref 0.0–7.0)
Eosinophils Absolute: 0.7 10*3/uL — ABNORMAL HIGH (ref 0.0–0.5)
HCT: 33.5 % — ABNORMAL LOW (ref 38.4–49.9)
HGB: 11.3 g/dL — ABNORMAL LOW (ref 13.0–17.1)
LYMPH%: 40.8 % (ref 14.0–49.0)
MCH: 33.9 pg — ABNORMAL HIGH (ref 27.2–33.4)
MCHC: 33.8 g/dL (ref 32.0–36.0)
MCV: 100.4 fL — ABNORMAL HIGH (ref 79.3–98.0)
MONO#: 0.8 10*3/uL (ref 0.1–0.9)
MONO%: 15.5 % — ABNORMAL HIGH (ref 0.0–14.0)
NEUT#: 1.5 10*3/uL (ref 1.5–6.5)
NEUT%: 29.8 % — ABNORMAL LOW (ref 39.0–75.0)
Platelets: 120 10*3/uL — ABNORMAL LOW (ref 140–400)
RBC: 3.34 10*6/uL — ABNORMAL LOW (ref 4.20–5.82)
RDW: 14.6 % (ref 11.0–14.6)
WBC: 4.9 10*3/uL (ref 4.0–10.3)
lymph#: 2 10*3/uL (ref 0.9–3.3)

## 2010-05-27 ENCOUNTER — Ambulatory Visit
Admission: RE | Admit: 2010-05-27 | Discharge: 2010-05-27 | Payer: Self-pay | Source: Home / Self Care | Attending: Endocrinology | Admitting: Endocrinology

## 2010-06-06 LAB — CONVERTED CEMR LAB
ALT: 14 units/L (ref 0–53)
AST: 22 units/L (ref 0–37)
BUN: 27 mg/dL — ABNORMAL HIGH (ref 6–23)
Bacteria, UA: NEGATIVE
Basophils Relative: 0.5 % (ref 0.0–1.0)
Bilirubin, Direct: 0 mg/dL (ref 0.0–0.3)
CO2: 23 meq/L (ref 19–32)
Calcium: 9.2 mg/dL (ref 8.4–10.5)
Chloride: 109 meq/L (ref 96–112)
Cholesterol: 191 mg/dL (ref 0–200)
Creatinine, Ser: 1.5 mg/dL (ref 0.4–1.5)
Direct LDL: 71.5 mg/dL
Direct LDL: 87.7 mg/dL
Glucose, Bld: 130 mg/dL — ABNORMAL HIGH (ref 70–99)
HCT: 33 % — ABNORMAL LOW (ref 39.0–52.0)
HDL: 72.6 mg/dL (ref >39.00)
Hemoglobin, Urine: NEGATIVE
Hemoglobin: 11 g/dL — ABNORMAL LOW (ref 13.0–17.0)
Hgb A1c MFr Bld: 5.7 % (ref 4.6–6.0)
MCHC: 33.3 g/dL (ref 30.0–36.0)
Monocytes Absolute: 0.5 10*3/uL (ref 0.1–1.0)
Monocytes Relative: 7.4 % (ref 3.0–12.0)
Neutro Abs: 4.5 10*3/uL (ref 1.4–7.7)
Nitrite: NEGATIVE
RDW: 12.3 % (ref 11.5–14.6)
Total Bilirubin: 0.6 mg/dL (ref 0.3–1.2)
Total CHOL/HDL Ratio: 2.9
Total CHOL/HDL Ratio: 3
Total Protein, Urine: 30 mg/dL — AB
Total Protein: 7.2 g/dL (ref 6.0–8.3)
Triglycerides: 316 mg/dL — ABNORMAL HIGH (ref 0.0–149.0)

## 2010-06-08 NOTE — Progress Notes (Signed)
Summary: renal results  Phone Note From Other Clinic Call back at [phone]    Caller: Debra/[facility] Summary of Call: request results of renal, please call back today, pt in the ofc Initial call taken by: Migdalia Dk,  July 28, 2009 4:06 PM  Follow-up for Phone Call        results not available yet, office closed at this time Deliah Goody, RN  July 28, 2009 5:52 PM  spoke with debra at [facility]. they need a prelimary on his renals. spoke with vascular lab and will get results faxed to [phone] Deliah Goody, RN  July 29, 2009 8:46 AM

## 2010-06-08 NOTE — Progress Notes (Signed)
Summary: RX alternative  Phone Note Call from Patient Call back at Home Phone [phone]   Summary of Call: Patient is requesting alternative for depression stating that current med is not working. Please advise. Initial call taken by: Lucious Groves,  July 27, 2009 10:52 AM  Follow-up for Phone Call        the dosage is very low.  i am happy to change, but you might find it best to increase the dosage of the citalopram Follow-up by: Minus Breeding MD,  July 27, 2009 12:09 PM  Additional Follow-up for Phone Call Additional follow up Details #1::        Patient prefers to change the medicine. Please advise. Additional Follow-up by: Lucious Groves,  July 27, 2009 12:29 PM    Additional Follow-up for Phone Call Additional follow up Details #2::    rx sent Follow-up by: Minus Breeding MD,  July 27, 2009 12:32 PM  Additional Follow-up for Phone Call Additional follow up Details #3:: Details for Additional Follow-up Action Taken: thanks. Additional Follow-up by: Lucious Groves,  July 27, 2009 12:32 PM  New/Updated Medications: PAROXETINE HCL 20 MG TABS (PAROXETINE HCL) 1 once daily Prescriptions: PAROXETINE HCL 20 MG TABS (PAROXETINE HCL) 1 once daily  #30 x 11   Entered and Authorized by:   Minus Breeding MD   Signed by:   Minus Breeding MD on 07/27/2009   Method used:   [facility] to        [facility]  [phone]* (retail)       [address] [place], [place]  [address]       Ph: [phone] or [phone]       Fax: [phone]   RxID:   [id number]

## 2010-06-08 NOTE — Assessment & Plan Note (Signed)
Summary: loss of appetite/sheri   History of Present Illness Visit Type: Follow-up Visit Primary GI MD: Stan Head MD [facility] Primary Provider: Romero Belling, MD Requesting Provider: n/a Chief Complaint: patient c/o loss of appetite x 1 + months. History of Present Illness:   77 yo Brandon [name] has noticed a change in appetite. This started months ago and is worsening. Main issue is anorexia though he does have some early satiety.  He has always had gagging with pills at times. Occasionally he will have that same feeling when he gets ready to eat.  Wife is asking if medication side effects may be playing a role.  Minimal weight loss. Forcing himself to eat.   GI Review of Systems    Reports loss of appetite.      Denies abdominal pain, acid reflux, belching, bloating, chest pain, dysphagia with liquids, dysphagia with solids, heartburn, nausea, vomiting, vomiting blood, weight loss, and  weight gain.        Denies anal fissure, black tarry stools, change in bowel habit, constipation, diarrhea, diverticulosis, fecal incontinence, heme positive stool, hemorrhoids, irritable bowel syndrome, jaundice, light color stool, liver problems, rectal bleeding, and  rectal pain. Clinical Reports Reviewed:  EGD:  12/02/2005:  Findings: Duodenitis Findings: Hiatal Hernia Location: [facility]   Comments: 1) MILD DUODENITIS 2) ESOPHAGITIS AND ULCER HEALED 3) SMALL HIATAL HERNIA  09/19/2005:  Findings: Esophagitis/GERD  Findings: Ulcer-Duodenal  Findings: Hiatal Hernia Location: [facility]  Comments: 1) Severe ulcerative esophagitis which could be due to reflux or Fosamax or both 2) Duodenal ulcers, ASA and/or Fosamax potential causes, H. pylori bx (CLO) taken from antrum 3) Small hiatal hernia  ***MICROSCOPIC EXAMINATION AND DIAGNOSIS***    ESOPHAGEAL BIOPSIES: ULCERATED ESOPHAGEAL MUCOSA. NO DYSPLASIA OR MALIGNANCY IDENTIFIED.  CLO TEST=NEGATIVE     Current  Medications (verified): 1)  Multivitamins   Tabs (Multiple Vitamin) .... Take 1 By Mouth Qd 2)  Adult Aspirin Low Strength 81 Mg  Tbdp (Aspirin) .... Take 1 By Mouth Qd 3)  Prilosec 20 Mg Cpdr (Omeprazole) .Marland Kitchen.. 1 Tab By Mouth Two Times A Day 4)  Calcitriol 0.25 Mcg Caps (Calcitriol) .Marland Kitchen.. 1 Tab Once Daily 5)  Crestor 20 Mg Tabs (Rosuvastatin Calcium) .... Take One Tablet By Mouth Daily. 6)  Losartan Potassium-Hctz 100-12.5 Mg Tabs (Losartan Potassium-Hctz) .Marland Kitchen.. 1 Once Daily 7)  Tamsulosin Hcl 0.4 Mg Caps (Tamsulosin Hcl) .Marland Kitchen.. 1 By Mouth Once Daily 8)  Amlodipine Besylate 2.5 Mg Tabs (Amlodipine Besylate) .Marland Kitchen.. 1 Once Daily 9)  Fluoxetine Hcl 10 Mg Caps (Fluoxetine Hcl) .Marland Kitchen.. 1 By Mouth Once Daily 10)  Ferrous Sulfate 325 (65 Fe) Mg Tbec (Ferrous Sulfate) .... Take 1 Tablet By Mouth Once A Day  Allergies (verified): No Known Drug Allergies  Past History:  Past Medical History: FHF/CVA LE Varicosities Hyperkalemia due to Ace Inhibitors SECONDARY HYPERPARATHYROIDISM (ICD-588.81) CEREBROVASCULAR DISEASE (ICD-437.9) DYSLIPIDEMIA (ICD-272.4) PERIPHERAL VASCULAR DISEASE (ICD-443.9) HYPERTENSION (ICD-401.9) ATRIAL FIBRILLATION (ICD-427.31); post operative ANEMIA OF CHRONIC DISEASE (ICD-285.29) GERD (ICD-530.81) RENAL INSUFFICIENCY (ICD-588.9) ARTHRITIS, SPINE (ICD-721.90) COPD (ICD-496) OSTEOPOROSIS (ICD-733.00) HIATAL HERNIA (ICD-553.3) DEPRESSION  Past Surgical History: EGD (12/02/2005) Stress Cardiolite (10/28/2005) EKG (07/04/2000) DEXA (11/2003) Back Surgery-Lumbar Fusion 9/09  In October 2009, carotid ultrasound, right internal carotid  artery 60-79%, left internal carotid artery 40-59%.   Family History: Reviewed history from 09/18/2008 and no changes required.  Mother died at 75 of unknown causes.  Father died at 74   of CVA.  He also had a history of coronary disease.  Sister  died at 6 in a drowning accident.  No FH of Colon Cancer:  Social  History: retired married Patient is a former smoker. -stopped 2003 Alcohol Use - no Daily Caffeine Use-2.5 cups daily Illicit Drug Use - no Patient does not get regular exercise.   Review of Systems       still struggling wth back pain and difficulty performing normal activities has been on fluoxetine without benefit to anhedonia, anorexia had been on venlaxafine but that was not helpful  Vital Signs:  Patient profile:   77 year old male Height:      70 inches Weight:      147.13 pounds BMI:     21.19 BSA:     1.83 Pulse rate:   64 / minute Pulse rhythm:   irregular BP sitting:   134 / 60  (right arm)  Vitals Entered By: Lamona Curl CMA Duncan Dull) (January 18, 2010 9:39 AM)  Physical Exam  General:  Well developed, well nourished, no acute distress. Lungs:  Clear throughout to auscultation. Heart:  Regular rate and rhythm; no murmurs, rubs,  or bruits. Abdomen:  Soft, nontender and nondistended. No masses, hepatosplenomegaly or hernias noted. Normal bowel sounds. Cervical Nodes:  No significant cervical or supraclavicular adenopathy.  Psych:  mildly flat affect appropriate not suicidal    Impression & Recommendations:  Problem # 1:  LOSS OF APPETITE (ICD-783.0) Assessment Comment Only  NEWto GI Seems to be due to depression no real weight loss here will change fluoxetine to Cymbalta as it may also help his back pain he had inquired about mirtazipine which may be good but if he can get back pain help with the Cymbalta it ay be best check labs  Orders: TLB-CBC Platelet - w/Differential (85025-CBCD) TLB-CMP (Comprehensive Metabolic Pnl) (80053-COMP) TLB-TSH (Thyroid Stimulating Hormone) (84443-TSH)  Problem # 2:  DEPRESSION (ICD-311) Assessment: Comment Only New issue to me  start Cymbalta at 30 mg daily rev 2 onths  Patient Instructions: 1)  Please pick up your medications at your pharmacy. 2)  Fluoxetine was stopped and Cymbalta started. 3)   Cymbalta dose can be increased so if you do not feel any better after 3 weeks on this dose call us and will increase the dose. 4)  Please go to the basement to have your lab tests drawn today. 5)  Please schedule a follow-up appointment in 6 to 8 weeks.  6)  Copy sent to : Romero Belling, MD 7)  The medication list was reviewed and reconciled.  All changed / newly prescribed medications were explained.  A complete medication list was provided to the patient / caregiver. Prescriptions: CYMBALTA 30 MG CPEP (DULOXETINE HCL) 1 by mouth once daily  #30 x 2   Entered and Authorized by:   Iva Boop MD, [facility]   Signed by:   Iva Boop MD, FACG on 01/18/2010   Method used:   [facility]        [facility]  [phone]* (retail)       [address] [place], [place]  [address]       Ph: [phone] or [phone]       Fax: [phone]   RxID:   [id number]

## 2010-06-08 NOTE — Progress Notes (Signed)
Summary: Paperchart  ---- Converted from flag ---- ---- 07/02/2009 10:10 AM, Minus Breeding MD wrote: paper chart please please load pneumovax ------------------------------  Phone Note Outgoing Call   Summary of Call: Paperchart ordered Initial call taken by: Josph Macho RMA,  July 02, 2009 10:14 AM      Immunization History:  Pneumovax Immunization History:    Pneumovax:  historical (05/09/2002)

## 2010-06-08 NOTE — Assessment & Plan Note (Signed)
Summary: F/U PER PT/#/CD   Vital Signs:  Patient profile:   77 year old male Height:      70 inches (177.80 cm) Weight:      148.25 pounds (67.39 kg) O2 Sat:      95 % on Room air Temp:     98.3 degrees F (36.83 degrees C) oral Pulse rate:   78 / minute BP sitting:   122 / 62  (right arm) Cuff size:   regular  Vitals Entered By: Josph Macho RMA (Oct 01, 2009 10:52 AM)  O2 Flow:  Room air CC: Follow-up visit/ CF   Referring Provider:  n/a Primary Provider:  Lorna Few  CC:  Follow-up visit/ CF.  History of Present Illness: pt takes hyzaar each am, as rx'ed.  he says bp at home is 150 systolic. he has depression despite paxil.  Current Medications (verified): 1)  Multivitamins   Tabs (Multiple Vitamin) .... Take 1 By Mouth Qd 2)  Adult Aspirin Low Strength 81 Mg  Tbdp (Aspirin) .... Take 1 By Mouth Qd 3)  Prilosec 20 Mg Cpdr (Omeprazole) .Marland Kitchen.. 1 Tab By Mouth Two Times A Day 4)  Calcitriol 0.25 Mcg Caps (Calcitriol) .Marland Kitchen.. 1 Qd 5)  Crestor 40 Mg Tabs (Rosuvastatin Calcium) .... 1/2 Tab Once Daily 6)  Integra F 125-1 Mg Caps (Fe Fum-Fepoly-Fa-Vit C-Vit B3) .Marland Kitchen.. 1 Tab By Mouth Once Daily 7)  Fish Oil .Marland Kitchen.. 1000 Mg Daily 8)  Paroxetine Hcl 20 Mg Tabs (Paroxetine Hcl) .Marland Kitchen.. 1 Once Daily 9)  Losartan Potassium-Hctz 100-12.5 Mg Tabs (Losartan Potassium-Hctz) .Marland Kitchen.. 1 Once Daily  Allergies (verified): No Known Drug Allergies  Past History:  Past Medical History: Last updated: 06/23/2009 FHF/CVA LE Varicosities Hyperkalemia due to Ace Inhibitors Aspiration Pneumonia 3/10    (08/27/2008) ENCOUNTER FOR LONG-TERM USE OF OTHER MEDICATIONS (ICD-V58.69) SECONDARY HYPERPARATHYROIDISM (ICD-588.81) CEREBROVASCULAR DISEASE (ICD-437.9) DYSLIPIDEMIA (ICD-272.4) PERIPHERAL VASCULAR DISEASE (ICD-443.9) MYOCARDIAL INFARCTION, HX OF (ICD-412) HYPERTENSION (ICD-401.9) EDEMA (ICD-782.3) ATRIAL FIBRILLATION (ICD-427.31) ANEMIA OF CHRONIC DISEASE (ICD-285.29) COLONIC ILEUS  (ICD-560.1) DIARRHEA-PRESUMED INFECTIOUS (ICD-009.3) ILEUS (ICD-560.1) DIARRHEA (ICD-787.91) SPECIAL SCREENING FOR MALIGNANT NEOPLASMS COLON (ICD-V76.51) GERD (ICD-530.81) RENAL INSUFFICIENCY (ICD-588.9) ARTHRITIS, SPINE (ICD-721.90) HYPERCHOLESTEROLEMIA (ICD-272.0) UNSPECIFIED ANEMIA (ICD-285.9) HIP PAIN, BILATERAL (ICD-719.45) HYPERGLYCEMIA (ICD-790.29) SPECIAL SCREENING MALIGNANT NEOPLASM OF PROSTATE (ICD-V76.44) ROUTINE GENERAL MEDICAL EXAM@HEALTH  CARE FACL (ICD-V70.0) COPD (ICD-496) OSTEOPOROSIS (ICD-733.00) HIATAL HERNIA (ICD-553.3) ANEMIA, IRON DEFICIENCY (ICD-280.9)  Review of Systems       he states weight gain despite poor appetite.  no insomnia  Physical Exam  General:  normal appearance.   Psych:  depressed affect.     Impression & Recommendations:  Problem # 1:  HYPERTENSION (ICD-401.9) with some situational component  Problem # 2:  depression needs increased rx  Medications Added to Medication List This Visit: 1)  Venlafaxine Hcl 37.5 Mg Tabs (Venlafaxine hcl) .... 2 tabs once daily  Other Orders: Est. Patient Level III (04540)  Patient Instructions: 1)  try taking losartan-hct at night. 2)  change paroxitene to venlafaxene 2x37.5 mg once daily.  for the first week, take only 1 pill once daily 3)  Please schedule a follow-up appointment in 4 months. Prescriptions: VENLAFAXINE HCL 37.5 MG TABS (VENLAFAXINE HCL) 2 tabs once daily  #60 x 11   Entered and Authorized by:   Minus Breeding MD   Signed by:   Minus Breeding MD on 10/01/2009   Method used:   [facility]        [facility]  [address]  [phone]* (retail)  [address] [place], [place]  [address]       Ph: [phone] or [phone]       Fax: [phone]   RxID:   [id number]

## 2010-06-08 NOTE — Consult Note (Signed)
Summary: [facility]   Imported By: Sherian Rein 07/16/2009 07:20:35  _____________________________________________________________________  External Attachment:    Type:   Image     Comment:   External Document

## 2010-06-08 NOTE — Assessment & Plan Note (Signed)
Summary: Brandon [name] was told to sched--stc   Vital Signs:  Patient profile:   77 year old male Height:      71 inches (180.34 cm) Weight:      144.13 pounds (65.51 kg) Temp:     96.7 degrees F (35.94 degrees C) oral Pulse rate:   78 / minute BP sitting:   102 / 50  (left arm) Cuff size:   regular  Vitals Entered By: Josph Macho RMA (June 23, 2009 9:36 AM) CC: Follow-up visit/ pt states he quit taking his BP meds yesterday (06/22/09) per MD/ CF   Referring Provider:  n/a Primary Provider:  Lorna Few  CC:  Follow-up visit/ pt states he quit taking his BP meds yesterday (06/22/09) per MD/ CF.  History of Present Illness: pt was advised to stop his bp meds yesterday, due to hypotension.  no dizziness since he stopped the meds. pt states 2 weeks of moderately poor appetite, but no associated pain in the abdomen. no dysphagia or odynophagia.  he says "i do not enjoy eating."   Current Medications (verified): 1)  Multivitamins   Tabs (Multiple Vitamin) .... Take 1 By Mouth Qd 2)  Adult Aspirin Low Strength 81 Mg  Tbdp (Aspirin) .... Take 1 By Mouth Qd 3)  Prilosec 20 Mg Cpdr (Omeprazole) .Marland Kitchen.. 1 Tab By Mouth Two Times A Day 4)  Calcitriol 0.25 Mcg Caps (Calcitriol) .Marland Kitchen.. 1 Qd 5)  Hyzaar 100-25 Mg Tabs (Losartan Potassium-Hctz) .Marland Kitchen.. 1 Qd 6)  Amlodipine Besylate 5 Mg Tabs (Amlodipine Besylate) .Marland Kitchen.. 1 Qd 7)  Crestor 40 Mg Tabs (Rosuvastatin Calcium) .Marland Kitchen.. 1 Qd 8)  Integra F 125-1 Mg Caps (Fe Fum-Fepoly-Fa-Vit C-Vit B3) .Marland Kitchen.. 1 Tab By Mouth Once Daily  Allergies (verified): No Known Drug Allergies  Past History:  Past Medical History: FHF/CVA LE Varicosities Hyperkalemia due to Ace Inhibitors Aspiration Pneumonia 3/10    (08/27/2008) ENCOUNTER FOR LONG-TERM USE OF OTHER MEDICATIONS (ICD-V58.69) SECONDARY HYPERPARATHYROIDISM (ICD-588.81) CEREBROVASCULAR DISEASE (ICD-437.9) DYSLIPIDEMIA (ICD-272.4) PERIPHERAL VASCULAR DISEASE (ICD-443.9) MYOCARDIAL INFARCTION, HX OF  (ICD-412) HYPERTENSION (ICD-401.9) EDEMA (ICD-782.3) ATRIAL FIBRILLATION (ICD-427.31) ANEMIA OF CHRONIC DISEASE (ICD-285.29) COLONIC ILEUS (ICD-560.1) DIARRHEA-PRESUMED INFECTIOUS (ICD-009.3) ILEUS (ICD-560.1) DIARRHEA (ICD-787.91) SPECIAL SCREENING FOR MALIGNANT NEOPLASMS COLON (ICD-V76.51) GERD (ICD-530.81) RENAL INSUFFICIENCY (ICD-588.9) ARTHRITIS, SPINE (ICD-721.90) HYPERCHOLESTEROLEMIA (ICD-272.0) UNSPECIFIED ANEMIA (ICD-285.9) HIP PAIN, BILATERAL (ICD-719.45) HYPERGLYCEMIA (ICD-790.29) SPECIAL SCREENING MALIGNANT NEOPLASM OF PROSTATE (ICD-V76.44) ROUTINE GENERAL MEDICAL EXAM@HEALTH  CARE FACL (ICD-V70.0) COPD (ICD-496) OSTEOPOROSIS (ICD-733.00) HIATAL HERNIA (ICD-553.3) ANEMIA, IRON DEFICIENCY (ICD-280.9)  Review of Systems  The patient denies syncope, hematochezia, hematuria, and depression.    Physical Exam  General:  elderly, frail, no distress  Abdomen:  abdomen is soft, nontender.  no hepatosplenomegaly.   not distended.  no hernia  Additional Exam:  BUN                  [H]  49 mg/dL                    1-61   Creatinine           [H]  2.5 mg/dL     Impression & Recommendations:  Problem # 1:  HYPERTENSION (ICD-401.9) overcontrolled.  Problem # 2:  poor appetite uncertain etiology.  ? related to #3  Problem # 3:  RENAL INSUFFICIENCY (ICD-588.9) Assessment: Deteriorated  Other Orders: T-Parathyroid Hormone, Intact w/ Calcium (09604-54098) TLB-Lipid Panel (80061-LIPID) TLB-BMP (Basic Metabolic Panel-BMET) (80048-METABOL) TLB-Hepatic/Liver Function Pnl (80076-HEPATIC) TLB-TSH (Thyroid Stimulating Hormone) (84443-TSH) TLB-Amylase (82150-AMYL) TLB-A1C / Hgb  A1C (Glycohemoglobin) (83036-A1C) TLB-Udip w/ Micro (81001-URINE) TLB-PSA (Prostate Specific Antigen) (84153-PSA) Nephrology Referral (Nephro) Est. Patient Level IV (16109)  Patient Instructions: 1)  stay-off the amlodipine 2)  in 2 days, resume the hyzaar, if bp is over 110/-. 3)  physical  next week. 4)  (update: i left message on phone-tree:  ref nephrol.  come in for physical soon, and we'll address thyroid then).

## 2010-06-08 NOTE — Assessment & Plan Note (Signed)
Summary: PROSTATE PROBLEM/ PROBLEMS URINATING/ PAIN /NWS   Vital Signs:  Patient profile:   77 year old male Height:      70 inches (177.80 cm) Weight:      148 pounds (67.27 kg) O2 Sat:      98 % on Room air Temp:     97.2 degrees F (36.22 degrees C) oral Pulse rate:   85 / minute BP sitting:   136 / 60  (right arm) Cuff size:   regular  Vitals Entered By: Orlan Leavens (October 12, 2009 8:02 AM)  O2 Flow:  Room air CC: Problem urinating Is Patient Diabetic? No Pain Assessment Patient in pain? no        Referring Provider:  n/a Primary Provider:  Lorna Few  CC:  Problem urinating.  History of Present Illness: pt states last week had 2 days of slight pain at the bladder area, and assoc urinary hesitancy.  it resolved without rx. he says effexor causes insufficient improvement in his depression. pt says his bp is consistently elevated at home.    Current Medications (verified): 1)  Multivitamins   Tabs (Multiple Vitamin) .... Take 1 By Mouth Qd 2)  Adult Aspirin Low Strength 81 Mg  Tbdp (Aspirin) .... Take 1 By Mouth Qd 3)  Prilosec 20 Mg Cpdr (Omeprazole) .Marland Kitchen.. 1 Tab By Mouth Two Times A Day 4)  Calcitriol 0.25 Mcg Caps (Calcitriol) .Marland Kitchen.. 1 Qd 5)  Crestor 40 Mg Tabs (Rosuvastatin Calcium) .... 1/2 Tab Once Daily 6)  Integra F 125-1 Mg Caps (Fe Fum-Fepoly-Fa-Vit C-Vit B3) .Marland Kitchen.. 1 Tab By Mouth Once Daily 7)  Fish Oil .Marland Kitchen.. 1000 Mg Daily 8)  Losartan Potassium-Hctz 100-12.5 Mg Tabs (Losartan Potassium-Hctz) .Marland Kitchen.. 1 Once Daily 9)  Venlafaxine Hcl 37.5 Mg Tabs (Venlafaxine Hcl) .... 2 Tabs Once Daily  Allergies (verified): No Known Drug Allergies  Past History:  Past Medical History: Last updated: 06/23/2009 FHF/CVA LE Varicosities Hyperkalemia due to Ace Inhibitors Aspiration Pneumonia 3/10    (08/27/2008) ENCOUNTER FOR LONG-TERM USE OF OTHER MEDICATIONS (ICD-V58.69) SECONDARY HYPERPARATHYROIDISM (ICD-588.81) CEREBROVASCULAR DISEASE (ICD-437.9) DYSLIPIDEMIA  (ICD-272.4) PERIPHERAL VASCULAR DISEASE (ICD-443.9) MYOCARDIAL INFARCTION, HX OF (ICD-412) HYPERTENSION (ICD-401.9) EDEMA (ICD-782.3) ATRIAL FIBRILLATION (ICD-427.31) ANEMIA OF CHRONIC DISEASE (ICD-285.29) COLONIC ILEUS (ICD-560.1) DIARRHEA-PRESUMED INFECTIOUS (ICD-009.3) ILEUS (ICD-560.1) DIARRHEA (ICD-787.91) SPECIAL SCREENING FOR MALIGNANT NEOPLASMS COLON (ICD-V76.51) GERD (ICD-530.81) RENAL INSUFFICIENCY (ICD-588.9) ARTHRITIS, SPINE (ICD-721.90) HYPERCHOLESTEROLEMIA (ICD-272.0) UNSPECIFIED ANEMIA (ICD-285.9) HIP PAIN, BILATERAL (ICD-719.45) HYPERGLYCEMIA (ICD-790.29) SPECIAL SCREENING MALIGNANT NEOPLASM OF PROSTATE (ICD-V76.44) ROUTINE GENERAL MEDICAL EXAM@HEALTH  CARE FACL (ICD-V70.0) COPD (ICD-496) OSTEOPOROSIS (ICD-733.00) HIATAL HERNIA (ICD-553.3) ANEMIA, IRON DEFICIENCY (ICD-280.9)  Review of Systems  The patient denies fever and hematuria.    Physical Exam  General:  normal appearance.   Abdomen:  no suprapubic tenderness. Extremities:  no edema   Impression & Recommendations:  Problem # 1:  BLADDER NECK OBSTRUCTION (ICD-596.0) needs increased rx  Problem # 2:  DEPRESSION (ICD-311) needs increased rx  Problem # 3:  HYPERTENSION (ICD-401.9) with situational component needs increased rx  Medications Added to Medication List This Visit: 1)  Calcitriol 0.25 Mcg Caps (Calcitriol) .Marland Kitchen.. 1 tab once daily 2)  Tamsulosin Hcl 0.4 Mg Caps (Tamsulosin hcl) .Marland Kitchen.. 1 at bedtime 3)  Venlafaxine Hcl 150 Mg Xr24h-cap (Venlafaxine hcl) .Marland Kitchen.. 1 once daily 4)  Amlodipine Besylate 2.5 Mg Tabs (Amlodipine besylate) .Marland Kitchen.. 1 once daily  Other Orders: TLB-Udip w/ Micro (81001-URINE) Est. Patient Level IV (21308)  Patient Instructions: 1)  add tamsulosin 0.4 mg once daily 2)  check urine test today.  please call [phone] to hear your test results. 3)  increase venlafaxine to 150 mg once daily 4)  return as scheduled. 5)  add amlodipine 2.5 mg once daily. 6)  (update: i left  message on phone-tree:  rx as we discussed) Prescriptions: AMLODIPINE BESYLATE 2.5 MG TABS (AMLODIPINE BESYLATE) 1 once daily  #30 x 11   Entered and Authorized by:   Minus Breeding MD   Signed by:   Minus Breeding MD on 10/12/2009   Method used:   [facility]        [facility]  [phone]* (retail)       [address] [place], [place]  [address]       Ph: [phone] or [phone]       Fax: [phone]   RxID:   [id number] VENLAFAXINE HCL 150 MG XR24H-CAP (VENLAFAXINE HCL) 1 once daily  #30 x 11   Entered and Authorized by:   Minus Breeding MD   Signed by:   Minus Breeding MD on 10/12/2009   Method used:   [facility] to        [facility]  [phone]* (retail)       [address] [place], [place]  [address]       Ph: [phone] or [phone]       Fax: [phone]   RxID:   [id number] TAMSULOSIN HCL 0.4 MG CAPS (TAMSULOSIN HCL) 1 at bedtime  #30 x 11   Entered and Authorized by:   Minus Breeding MD   Signed by:   Minus Breeding MD on 10/12/2009   Method used:   [facility] to        [facility]  [phone]* (retail)       [address] [place], [place]  [address]       Ph: [phone] or [phone]       Fax: [phone]   RxID:   [id number]

## 2010-06-08 NOTE — Letter (Signed)
[facility]   Imported By: Sherian Rein 06/25/2009 11:32:55  _____________________________________________________________________  External Attachment:    Type:   Image     Comment:   External Document

## 2010-06-08 NOTE — Progress Notes (Signed)
Summary: BP MED  Phone Note Call from Patient   Summary of Call: Pt called to tell the Dr that he resumed the Hyzaar 100-25. Says his bp continued to be elevated. Pt also is req generic alternative to crestor. Please advise.  Initial call taken by: Lamar Sprinkles, CMA,  August 03, 2009 1:26 PM  Follow-up for Phone Call        please come in for appt.  we'll check the bp, and discuss options for cholesterol. Follow-up by: Minus Breeding MD,  August 03, 2009 1:44 PM  Additional Follow-up for Phone Call Additional follow up Details #1::        Notified and patient coming in tomorrow. Additional Follow-up by: Lucious Groves,  August 03, 2009 3:38 PM

## 2010-06-08 NOTE — Letter (Signed)
[facility]   Imported By: Sherian Rein 10/14/2009 11:04:59  _____________________________________________________________________  External Attachment:    Type:   Image     Comment:   External Document

## 2010-06-08 NOTE — Letter (Signed)
[facility]   Imported By: Lennie Odor 07/08/2009 16:46:57  _____________________________________________________________________  External Attachment:    Type:   Image     Comment:   External Document

## 2010-06-08 NOTE — Letter (Signed)
Summary: [facility]  [facility]   Imported By: Lennie Odor 04/02/2010 14:45:26  _____________________________________________________________________  External Attachment:    Type:   Image     Comment:   External Document

## 2010-06-08 NOTE — Progress Notes (Signed)
Summary: Stomach problems  Phone Note Call from Patient Call back at Home Phone [phone]   Caller: spouse June Call For: Dr Leone Payor Summary of Call: Is having really bad abd pain and would like to see Dr before next available on 02-01-10 Initial call taken by: Leanor Kail [facility],  December 10, 2009 9:10 AM  Follow-up for Phone Call        wife states that patient has lack of appetite.  She says no abdominal pain at all.  Patient  has lack of appetite for several months.  Only other symptom is diarrhea that occurs about once every 2 weeks.  Patient  is rescheduled to 01/18/10 9:30 Follow-up by: Darcey Nora RN, CGRN,  December 10, 2009 9:36 AM

## 2010-06-08 NOTE — Progress Notes (Signed)
Summary: Rx?  Phone Note Call from Patient   Summary of Call: Pt called stating that Paroxetine is not helping with his symptoms. Pt is requesting increase dose or brand name product. please advise Initial call taken by: Margaret Pyle, CMA,  September 02, 2009 11:35 AM  Follow-up for Phone Call        ov 08/03/09 Follow-up by: Minus Breeding MD,  September 02, 2009 2:22 PM  Additional Follow-up for Phone Call Additional follow up Details #1::        pt informed and will decide if he wants to make ROV  Additional Follow-up by: Margaret Pyle, CMA,  September 02, 2009 2:37 PM

## 2010-06-08 NOTE — Assessment & Plan Note (Signed)
Summary: RTN OV--DISCUSS BP AND CHOLESTEROL./[place]   Vital Signs:  Patient profile:   77 year old male Height:      70 inches (177.80 cm) Weight:      146.50 pounds (66.59 kg) O2 Sat:      98 % on Room air Temp:     98.1 degrees F (36.72 degrees C) oral Pulse rate:   84 / minute BP sitting:   122 / 62  (right arm) Cuff size:   regular  Vitals Entered By: Josph Macho RMA (August 04, 2009 1:50 PM) Debby Freiberg Christopher,SMA  O2 Flow:  Room air CC: OV  to  discuss BP and Cholesterol./[place]   Referring Provider:  n/a Primary Provider:  Lorna Few  CC:  OV  to  discuss BP and Cholesterol./[place].  History of Present Illness: because of bp 150/, he increased hyzaar back to the higher dosage.   he takes crestor only 1/2 of 40 mg once daily.    -  Date:  07/28/2009    BG Random: 90    BUN: 18    Creatinine: 1.39    Sodium: 138    Potassium: 4.8    Chloride: 105    CO2 Total: 26    Calcium: 9.2    GFR(Non African American): 49    GFR(African American): 59  Current Medications (verified): 1)  Multivitamins   Tabs (Multiple Vitamin) .... Take 1 By Mouth Qd 2)  Adult Aspirin Low Strength 81 Mg  Tbdp (Aspirin) .... Take 1 By Mouth Qd 3)  Prilosec 20 Mg Cpdr (Omeprazole) .Marland Kitchen.. 1 Tab By Mouth Two Times A Day 4)  Calcitriol 0.25 Mcg Caps (Calcitriol) .Marland Kitchen.. 1 Qd 5)  Crestor 40 Mg Tabs (Rosuvastatin Calcium) .Marland Kitchen.. 1 Qd 6)  Integra F 125-1 Mg Caps (Fe Fum-Fepoly-Fa-Vit C-Vit B3) .Marland Kitchen.. 1 Tab By Mouth Once Daily 7)  Fish Oil .Marland Kitchen.. 1000 Mg Daily 8)  Losartan Potassium-Hctz 50-12.5 Mg Tabs (Losartan Potassium-Hctz) .Marland Kitchen.. 1 Once Daily 9)  Paroxetine Hcl 20 Mg Tabs (Paroxetine Hcl) .Marland Kitchen.. 1 Once Daily  Allergies (verified): No Known Drug Allergies  Past History:  Past Medical History: Last updated: 06/23/2009 FHF/CVA LE Varicosities Hyperkalemia due to Ace Inhibitors Aspiration Pneumonia 3/10    (08/27/2008) ENCOUNTER FOR LONG-TERM USE OF OTHER MEDICATIONS (ICD-V58.69) SECONDARY  HYPERPARATHYROIDISM (ICD-588.81) CEREBROVASCULAR DISEASE (ICD-437.9) DYSLIPIDEMIA (ICD-272.4) PERIPHERAL VASCULAR DISEASE (ICD-443.9) MYOCARDIAL INFARCTION, HX OF (ICD-412) HYPERTENSION (ICD-401.9) EDEMA (ICD-782.3) ATRIAL FIBRILLATION (ICD-427.31) ANEMIA OF CHRONIC DISEASE (ICD-285.29) COLONIC ILEUS (ICD-560.1) DIARRHEA-PRESUMED INFECTIOUS (ICD-009.3) ILEUS (ICD-560.1) DIARRHEA (ICD-787.91) SPECIAL SCREENING FOR MALIGNANT NEOPLASMS COLON (ICD-V76.51) GERD (ICD-530.81) RENAL INSUFFICIENCY (ICD-588.9) ARTHRITIS, SPINE (ICD-721.90) HYPERCHOLESTEROLEMIA (ICD-272.0) UNSPECIFIED ANEMIA (ICD-285.9) HIP PAIN, BILATERAL (ICD-719.45) HYPERGLYCEMIA (ICD-790.29) SPECIAL SCREENING MALIGNANT NEOPLASM OF PROSTATE (ICD-V76.44) ROUTINE GENERAL MEDICAL EXAM@HEALTH  CARE FACL (ICD-V70.0) COPD (ICD-496) OSTEOPOROSIS (ICD-733.00) HIATAL HERNIA (ICD-553.3) ANEMIA, IRON DEFICIENCY (ICD-280.9)  Review of Systems  The patient denies dyspnea on exertion.    Physical Exam  General:  normal appearance.   Msk:  gait is normal and steady Extremities:  no edema   Impression & Recommendations:  Problem # 1:  HYPERTENSION (ICD-401.9) overcontrolled  Problem # 2:  HYPERCHOLESTEROLEMIA (ICD-272.0) CHOL: 159 (06/23/2009)   LDL: 69 (03/06/2008)   HDL: 78.30 (06/23/2009)   TG: 204.0 (06/23/2009) well-controlled  Medications Added to Medication List This Visit: 1)  Crestor 40 Mg Tabs (Rosuvastatin calcium) .... 1/2 tab once daily 2)  Losartan Potassium-hctz 100-12.5 Mg Tabs (Losartan potassium-hctz) .Marland Kitchen.. 1 once daily  Other Orders: Est. Patient Level III (29562)  Patient  Instructions: 1)  take losartan-hct to 100/12.5, 1/day. 2)  there is no alternative to the crestor that is as effective.   3)  Please schedule a follow-up appointment in 3 months. Prescriptions: LOSARTAN POTASSIUM-HCTZ 100-12.5 MG TABS (LOSARTAN POTASSIUM-HCTZ) 1 once daily  #30 x 11   Entered and Authorized by:   Minus Breeding MD   Signed by:   Minus Breeding MD on 08/04/2009   Method used:   [facility]ron[facility] to        [facility]  [phone]* (retail)       [address] [place], [place]  [address]       Ph: [phone] or [phone]       Fax: [phone]   RxID:   [id number]

## 2010-06-08 NOTE — Assessment & Plan Note (Signed)
Summary: 8 WEEK FU /JMS   History of Present Illness Visit Type: Follow-up Visit Primary GI MD: Stan Head MD [facility] Primary Provider: Romero Belling, MD Requesting Provider: n/a Chief Complaint: follow-up loss of appetite  does c/o sleeping alot and fatigue History of Present Illness:   77 yo wm seen last month for weight loss, anorexia and was though to be depressed. Because of back pain, Cymbalta was tried but he did not tolerate it. Mirtazipine was atsrted. he has a significantly better appetite with gain in weight of 11# Still with anhedonia. Also still sleepy during the day. he thinks he is sleeping more with the mirtazipine but still takes about 3-4 hours to fall asleep after mirtazipine. Was given oxycodone for back pain but not sure it helps the pain. He had some incoherence possibly with oxycodone or a hydrocodone.    GI Review of Systems      Denies abdominal pain, acid reflux, belching, bloating, chest pain, dysphagia with liquids, dysphagia with solids, heartburn, loss of appetite, nausea, vomiting, vomiting blood, weight loss, and  weight gain.        Denies anal fissure, black tarry stools, change in bowel habit, constipation, diarrhea, diverticulosis, fecal incontinence, heme positive stool, hemorrhoids, irritable bowel syndrome, jaundice, light color stool, liver problems, rectal bleeding, and  rectal pain.    Current Medications (verified): 1)  Multivitamins   Tabs (Multiple Vitamin) .... Take 1 By Mouth Qd 2)  Adult Aspirin Low Strength 81 Mg  Tbdp (Aspirin) .... Take 1 By Mouth Qd 3)  Prilosec 20 Mg Cpdr (Omeprazole) .Marland Kitchen.. 1 Tab By Mouth Two Times A Day 4)  Calcitriol 0.25 Mcg Caps (Calcitriol) .Marland Kitchen.. 1 Tab Once Daily 5)  Crestor 20 Mg Tabs (Rosuvastatin Calcium) .... Take One Tablet By Mouth Daily. 6)  Losartan Potassium-Hctz 100-12.5 Mg Tabs (Losartan Potassium-Hctz) .Marland Kitchen.. 1 Once Daily 7)  Tamsulosin Hcl 0.4 Mg Caps (Tamsulosin Hcl) .Marland Kitchen.. 1 By Mouth Once Daily As  Needed 8)  Amlodipine Besylate 2.5 Mg Tabs (Amlodipine Besylate) .Marland Kitchen.. 1 Once Daily 9)  Mirtazapine 7.5 Mg Tabs (Mirtazapine) .Marland Kitchen.. 1 By Mouth At Bedtime  Allergies (verified): No Known Drug Allergies  Past History:  Past Medical History: FHF/CVA LE Varicosities Hyperkalemia due to Ace Inhibitors SECONDARY HYPERPARATHYROIDISM (ICD-588.81) CEREBROVASCULAR DISEASE (ICD-437.9) DYSLIPIDEMIA (ICD-272.4) PERIPHERAL VASCULAR DISEASE (ICD-443.9) HYPERTENSION (ICD-401.9) ATRIAL FIBRILLATION (ICD-427.31); post operative ANEMIA OF CHRONIC DISEASE (ICD-285.29) GERD (ICD-530.81) RENAL INSUFFICIENCY (ICD-588.9) ARTHRITIS, SPINE (ICD-721.90) DJD with Chronic low back pain COPD (ICD-496) OSTEOPOROSIS (ICD-733.00) HIATAL HERNIA (ICD-553.3) DEPRESSION  Past Surgical History: Reviewed history from 01/18/2010 and no changes required. EGD (12/02/2005) Stress Cardiolite (10/28/2005) EKG (07/04/2000) DEXA (11/2003) Back Surgery-Lumbar Fusion 9/09  In October 2009, carotid ultrasound, right internal carotid  artery 60-79%, left internal carotid artery 40-59%.   Family History: Reviewed history from 01/18/2010 and no changes required.  Mother died at 99 of unknown causes.  Father died at 15   of CVA.  He also had a history of coronary disease.  Sister died at 97 in a drowning accident.  No FH of Colon Cancer:  Social History: Reviewed history from 01/18/2010 and no changes required. retired married Patient is a former smoker. -stopped 2003 Alcohol Use - no Daily Caffeine Use-2.5 cups daily Illicit Drug Use - no Patient does not get regular exercise.   Vital Signs:  Patient profile:   77 year old male Height:      70 inches Weight:      158 pounds BMI:  22.75 Pulse rate:   80 / minute Pulse rhythm:   irregular BP sitting:   122 / 48  (right arm)  Vitals Entered By: Milford Cage NCMA (March 03, 2010 1:40 PM)  Physical Exam  General:  Well developed, well nourished, no acute  distress. Psych:  affect appropriate and more animated and energetic     Impression & Recommendations:  Problem # 1:  DEPRESSION (ICD-311) Assessment Improved he is responding to mirtazipine I told them he needs to wait another month and call me still with delay in initiating sleep he will take the mirtazipine earlier at night he may actually need a higher dose pending phone-call follow-up  wife seems to recognize improvement but had many concerns about some persisting but better prolems like fatigue/sleepiness and anhedonia chronic back pain is still part of the background and an issue  Problem # 2:  LOSS OF APPETITE (ICD-783.0) Assessment: Improved he is eating and gaining weight since mirtazipine started  Problem # 3:  WEIGHT LOSS (ICD-783.21) Assessment: Improved  Patient Instructions: 1)  Continue curent medications as you are but try taking the mirtazipine earlier as we discussed. 2)  Call Dr. Leone Payor back with a report on appetite, weight and sleepiness in about 1 month. 3)  Further GI follow-up will be recommended then. 4)  Schedule follow-up with Dr. Everardo All for geneal medical isues. 5)  ask Dr. Venetia Maxon about a pain specialist. 6)  Copy sent to : Romero Belling, MD 7)  The medication list was reviewed and reconciled.  All changed / newly prescribed medications were explained.  A complete medication list was provided to the patient / caregiver.   Influenza Vaccine    Vaccine Type: Fluvirin    Site: right deltoid    Mfr: Novartis    Dose: 0.5 ml    Route: IM    Given by: Harlow Mares CMA (AAMA)    Exp. Date: 10/2010    Lot #: [id number]    VIS given: 12/01/09 version given March 03, 2010.  Flu Vaccine Consent Questions    Do you have a history of severe allergic reactions to this vaccine? no    Any prior history of allergic reactions to egg and/or gelatin? no    Do you have a sensitivity to the preservative Thimersol? no    Do you have a past history of  Guillan-Barre Syndrome? no    Do you currently have an acute febrile illness? no    Have you ever had a severe reaction to latex? no    Vaccine information given and explained to patient? yes

## 2010-06-08 NOTE — Letter (Signed)
Summary: [facility] Office Note   [facility] Office Note   Imported By: Roderic Ovens 03/30/2010 14:21:49  _____________________________________________________________________  External Attachment:    Type:   Image     Comment:   External Document

## 2010-06-08 NOTE — Miscellaneous (Signed)
Summary: Orders Update  Clinical Lists Changes  Orders: Added new Test order of Renal Artery Duplex (Renal Artery Duplex) - Signed 

## 2010-06-08 NOTE — Progress Notes (Signed)
Summary: Rx change request  Phone Note Call from Patient Call back at Home Phone [phone]   Caller: Patient Call For: Minus Breeding MD Reason for Call: Talk to Doctor Summary of Call: Patient came into the office after just leaving Dr. Rebecca Eaton office. He wanted the patient to see if he could come off of Hyzaar and Norvasc. He's in the 80's and 90's for the systolic on his blood pressure. Initial call taken by: Irma Newness,  June 22, 2009 2:39 PM  Follow-up for Phone Call        pt advised to make appt with Dr. Everardo All as he has not been seen since July 2010. Follow-up by: Margaret Pyle, CMA,  June 22, 2009 3:01 PM  Additional Follow-up for Phone Call Additional follow up Details #1::        stop both meds ov tomorrow Additional Follow-up by: Minus Breeding MD,  June 22, 2009 3:18 PM    Additional Follow-up for Phone Call Additional follow up Details #2::    pt informed, stated that he will call back to schedule f/u appt. Follow-up by: Margaret Pyle, CMA,  June 22, 2009 3:47 PM

## 2010-06-08 NOTE — Progress Notes (Signed)
Summary: Rx refill/CPX  Phone Note Refill Request Message from:  Patient on March 24, 2010 2:57 PM  Refills Requested: Medication #1:  CRESTOR 20 MG TABS Take one tablet by mouth daily.   Dosage confirmed as above?Dosage Confirmed   Supply Requested: 3 months Pt was transferred to schedled CPX. Advised no further refills until appt.   Method Requested: Electronic Initial call taken by: Margaret Pyle, CMA,  March 24, 2010 2:57 PM    Prescriptions: CRESTOR 20 MG TABS (ROSUVASTATIN CALCIUM) Take one tablet by mouth daily.  #30 x 2   Entered by:   Margaret Pyle, CMA   Authorized by:   Minus Breeding MD   Signed by:   Margaret Pyle, CMA on 03/24/2010   Method used:   [facility] to        [facility]  [phone]* (retail)       [address] [place], [place]  [address]       Ph: [phone] or [phone]       Fax: [phone]   RxID:   [id number]

## 2010-06-08 NOTE — Assessment & Plan Note (Signed)
Summary: F11M/DM   Referring Provider:  n/a Primary Provider:  Lorna Few   History of Present Illness:  [name] is a pleasant gentleman who has a history of peripheral  vascular disease.  He has cerebrovascular disease.  Last carotid Dopplers were performed in Nov 2010. This revealed  60-79% right stenosis and 40-59% left stenosis. Left subclavian steal. Follow up was recommended in 6 months. He had a Myoview performed on October 28, 2005, that showed an ejection fraction of 62%.  There was diaphragmatic attenuation without ischemia or infarction.  He also had a  dobutamine echo performed on December 21, 2006.  It was felt to be a  normal study. Last echocardiogram in March of 2010 was difficult but LV function felt to be preserved. Renal Dopplers in March of 2011 showed no aneurysm and normal renal arteries.  Note he also has a history of postoperative atrial fibrillation. Isaw him in September of 2009. Since then the patient has dyspnea with more extreme activities but not with routine activities. It is relieved with rest. It is not associated with chest pain. There is no orthopnea, PND or pedal edema. There is no syncope or palpitations. There is no exertional chest pain. He also has bilateral lower extremity claudication after walking approximately 50 yards. This is unchanged by his report.   Review of Systems       no fevers or chills, productive cough, hemoptysis, dysphasia, odynophagia, melena, hematochezia, dysuria, hematuria, rash, seizure activity, orthopnea, PND, pedal edema. Remaining systems are negative.    Current Medications (verified): 1)  Multivitamins   Tabs (Multiple Vitamin) .... Take 1 By Mouth Qd 2)  Adult Aspirin Low Strength 81 Mg  Tbdp (Aspirin) .... Take 1 By Mouth Qd 3)  Prilosec 20 Mg Cpdr (Omeprazole) .Marland Kitchen.. 1 Tab By Mouth Two Times A Day 4)  Calcitriol 0.25 Mcg Caps (Calcitriol) .Marland Kitchen.. 1 Tab Once Daily 5)  Crestor 20 Mg Tabs (Rosuvastatin Calcium) .... Take One  Tablet By Mouth Daily. 6)  Fish Oil .Marland Kitchen.. 1000 Mg Daily 7)  Losartan Potassium-Hctz 100-12.5 Mg Tabs (Losartan Potassium-Hctz) .Marland Kitchen.. 1 Once Daily 8)  Tamsulosin Hcl 0.4 Mg Caps (Tamsulosin Hcl) .Marland Kitchen.. 1 By Mouth Once Daily 9)  Amlodipine Besylate 2.5 Mg Tabs (Amlodipine Besylate) .Marland Kitchen.. 1 Once Daily 10)  Fluoxetine Hcl 10 Mg Caps (Fluoxetine Hcl) .Marland Kitchen.. 1 By Mouth Once Daily  Allergies: No Known Drug Allergies  Past History:  Past Medical History: FHF/CVA LE Varicosities Hyperkalemia due to Ace Inhibitors SECONDARY HYPERPARATHYROIDISM (ICD-588.81) CEREBROVASCULAR DISEASE (ICD-437.9) DYSLIPIDEMIA (ICD-272.4) PERIPHERAL VASCULAR DISEASE (ICD-443.9) HYPERTENSION (ICD-401.9) ATRIAL FIBRILLATION (ICD-427.31); post operative ANEMIA OF CHRONIC DISEASE (ICD-285.29) GERD (ICD-530.81) RENAL INSUFFICIENCY (ICD-588.9) ARTHRITIS, SPINE (ICD-721.90) COPD (ICD-496) OSTEOPOROSIS (ICD-733.00) HIATAL HERNIA (ICD-553.3)  Social History: Reviewed history from 04/11/2008 and no changes required. retired married Patient is a former smoker.  Alcohol Use - no Daily Caffeine Use Illicit Drug Use - no Patient does not get regular exercise.   Review of Systems       no fevers or chills, productive cough, hemoptysis, dysphasia, odynophagia, melena, hematochezia, dysuria, hematuria, rash, seizure activity, orthopnea, PND, pedal edema. Remaining systems are negative.   Vital Signs:  Patient profile:   77 year old male Height:      70 inches Weight:      149 pounds BMI:     21.46 Pulse rate:   84 / minute Resp:     12 per minute BP sitting:   128 / 67  (left arm)  Vitals Entered By: Kem Parkinson (November 02, 2009 11:23 AM)  Physical Exam  General:  Well-developed well-nourished in no acute distress.  Skin is warm and dry.  HEENT is normal.  Neck is supple. No thyromegaly. bilateral bruits Chest is clear to auscultation with normal expansion.  Cardiovascular exam is regular rate and rhythm.    Abdominal exam nontender or distended. No masses palpated. Extremities show no edema. diminished pulse left upper extremity neuro grossly intact    EKG  Procedure date:  11/02/2009  Findings:      Sinus rhythm with occasional PACs and PVCs. Normal axis. RV conduction delay. No ST changes.  Impression & Recommendations:  Problem # 1:  CEREBROVASCULAR DISEASE (ICD-437.9) Continue aspirin and statin. Schedule followup carotids. Orders: Carotid Duplex (Carotid Duplex)  Problem # 2:  DYSLIPIDEMIA (ICD-272.4) Continue statin. Lipids and liver monitored by primary care. His updated medication list for this problem includes:    Crestor 20 Mg Tabs (Rosuvastatin calcium) .Marland Kitchen... Take one tablet by mouth daily.  Problem # 3:  PERIPHERAL VASCULAR DISEASE (ICD-443.9) Continue aspirin and statin. May need PV evaluation in the future if symptoms worsen.  Problem # 4:  HYPERTENSION (ICD-401.9) Continue present medications. Renal function and potassium monitored by primary care. His updated medication list for this problem includes:    Adult Aspirin Low Strength 81 Mg Tbdp (Aspirin) .Marland Kitchen... Take 1 by mouth qd    Losartan Potassium-hctz 100-12.5 Mg Tabs (Losartan potassium-hctz) .Marland Kitchen... 1 once daily    Amlodipine Besylate 2.5 Mg Tabs (Amlodipine besylate) .Marland Kitchen... 1 once daily  Problem # 5:  GERD (ICD-530.81)  His updated medication list for this problem includes:    Prilosec 20 Mg Cpdr (Omeprazole) .Marland Kitchen... 1 tab by mouth two times a day  Problem # 6:  COPD (ICD-496)  Patient Instructions: 1)  Your physician recommends that you schedule a follow-up appointment in: ONE YEAR 2)  Your physician has requested that you have a carotid duplex. This test is an ultrasound of the carotid arteries in your neck. It looks at blood flow through these arteries that supply the brain with blood. Allow one hour for this exam. There are no restrictions or special instructions.  Physical Exam  General:   Well-developed well-nourished in no acute distress.  Skin is warm and dry.  HEENT is normal.  Neck is supple. No thyromegaly. bilateral bruits Chest is clear to auscultation with normal expansion.  Cardiovascular exam is regular rate and rhythm.  Abdominal exam nontender or distended. No masses palpated. Extremities show no edema. diminished pulse left upper extremity neuro grossly intact    Impression & Recommendations:  Problem # 1:  CEREBROVASCULAR DISEASE (ICD-437.9) Continue aspirin and statin. Schedule followup carotids. Orders: Carotid Duplex (Carotid Duplex)  Problem # 2:  DYSLIPIDEMIA (ICD-272.4) Continue statin. Lipids and liver monitored by primary care. His updated medication list for this problem includes:    Crestor 20 Mg Tabs (Rosuvastatin calcium) .Marland Kitchen... Take one tablet by mouth daily.  Problem # 3:  PERIPHERAL VASCULAR DISEASE (ICD-443.9) Continue aspirin and statin. May need PV evaluation in the future if symptoms worsen.  Problem # 4:  HYPERTENSION (ICD-401.9) Continue present medications. Renal function and potassium monitored by primary care. His updated medication list for this problem includes:    Adult Aspirin Low Strength 81 Mg Tbdp (Aspirin) .Marland Kitchen... Take 1 by mouth qd    Losartan Potassium-hctz 100-12.5 Mg Tabs (Losartan potassium-hctz) .Marland Kitchen... 1 once daily    Amlodipine Besylate 2.5 Mg Tabs (Amlodipine besylate) .Marland KitchenMarland KitchenMarland KitchenMarland Kitchen  1 once daily  Problem # 5:  GERD (ICD-530.81)  His updated medication list for this problem includes:    Prilosec 20 Mg Cpdr (Omeprazole) .Marland Kitchen... 1 tab by mouth two times a day  Problem # 6:  COPD (ICD-496)

## 2010-06-08 NOTE — Progress Notes (Signed)
Summary: Triage  Phone Note Call from Patient Call back at Home Phone [phone]   Caller: spouse  June Call For: Dr. Leone Payor Summary of Call: pt. has been on CYMBALTA since Mon. and is acting like a "zombie", no energy and drowsy Initial call taken by: Karna Christmas,  January 21, 2010 10:58 AM  Follow-up for Phone Call        Pt. was seen 01-18-10, began Cymbalta that evening. Pt. c/o drowsiness throughout the morning, better in the afternoon.  I asked him to start taking it at bedtime and see how he does. I will call pt., if new orders, after MD reviews.  Follow-up by: Laureen Ochs LPN,  January 21, 2010 11:13 AM  Additional Follow-up for Phone Call Additional follow up Details #1::        I think taking at bedtime makes sense, sometimes it takes awhile to adjust so he will not necessarily stay this way.... We did start at low dose,  have him let us know if still having same problem by Monday. Additional Follow-up by: Iva Boop MD, Clementeen Graham,  January 21, 2010 1:38 PM    Additional Follow-up for Phone Call Additional follow up Details #2::    Above MD orders reviewed with patient. Pt. instructed to call back as needed.  Follow-up by: Laureen Ochs LPN,  January 21, 2010 1:49 PM

## 2010-06-08 NOTE — Assessment & Plan Note (Signed)
Summary: CPX-LB   Vital Signs:  Patient profile:   77 year old male Height:      71 inches (180.34 cm) Weight:      144.75 pounds (65.80 kg) O2 Sat:      99 % on Room air Temp:     96.6 degrees F (35.89 degrees C) oral Pulse rate:   90 / minute BP sitting:   108 / 66  (left arm) Cuff size:   regular  Vitals Entered By: Josph Macho RMA (July 02, 2009 9:34 AM)  O2 Flow:  Room air CC: Physical/ CF   Referring Provider:  n/a Primary Provider:  Lorna Few  CC:  Physical/ CF.  History of Present Illness: here for regular wellness examination.  He does not drink or smoke.  Current Medications (verified): 1)  Multivitamins   Tabs (Multiple Vitamin) .... Take 1 By Mouth Qd 2)  Adult Aspirin Low Strength 81 Mg  Tbdp (Aspirin) .... Take 1 By Mouth Qd 3)  Prilosec 20 Mg Cpdr (Omeprazole) .Marland Kitchen.. 1 Tab By Mouth Two Times A Day 4)  Calcitriol 0.25 Mcg Caps (Calcitriol) .Marland Kitchen.. 1 Qd 5)  Hyzaar 100-25 Mg Tabs (Losartan Potassium-Hctz) .Marland Kitchen.. 1 Qd 6)  Crestor 40 Mg Tabs (Rosuvastatin Calcium) .Marland Kitchen.. 1 Qd 7)  Integra F 125-1 Mg Caps (Fe Fum-Fepoly-Fa-Vit C-Vit B3) .Marland Kitchen.. 1 Tab By Mouth Once Daily  Allergies (verified): No Known Drug Allergies  Family History: Reviewed history from 09/18/2008 and no changes required.  Mother died at 80 of unknown causes.  Father died at 39   of CVA.  He also had a history of coronary disease.  Sister died at 16   in a drowning accident.  No FH of Colon Cancer:  Social History: Reviewed history from 04/11/2008 and no changes required. retired married Patient is a former smoker.  Alcohol Use - no Daily Caffeine Use Illicit Drug Use - no Patient does not get regular exercise.   Review of Systems  The patient denies fever, weight loss, weight gain, vision loss, decreased hearing, chest pain, dyspnea on exertion, prolonged cough, headaches, abdominal pain, melena, hematochezia, severe indigestion/heartburn, hematuria, and suspicious skin lesions.          he has slight depression--wife says is health-related.  Physical Exam  General:  elderly, frail, no distress  Head:  head: no deformity eyes: no periorbital swelling, no proptosis external nose and ears are normal mouth: no lesion seen Lungs:  Clear to auscultation bilaterally. Normal respiratory effort.  Heart:  irreg rhythm, normal rate, no murmur Abdomen:  abdomen is soft, nontender.  no hepatosplenomegaly.   not distended.  no hernia  Rectal:  normal external and internal exam.  heme neg  Prostate:  Normal size prostate without masses or tenderness.  Msk:  muscle bulk and strength are grossly normal.  no obvious joint swelling.  gait is steady with a cane  Pulses:  dorsalis pedis are absent bilat.  no carotid bruit  Extremities:  no deformity.  no ulcer on the feet.  feet are of normal color and temp.  no edema there is bilateral onychomycosis. there are bilateral varicosities Neurologic:  cn 2-12 grossly intact.   readily moves all 4's.   sensation is intact to touch on the feet  Skin:  normal texture and temp.  no rash.  not diaphoretic  Cervical Nodes:  No significant adenopathy.  Additional Exam:  SEPARATE EVALUATION FOLLOWS--EACH PROBLEM HERE IS NEW, NOT RESPONDING TO TREATMENT, OR POSES SIGNIFICANT RISK TO  THE PATIENT'S HEALTH: HISTORY OF THE PRESENT ILLNESS: pt states he has depression related to his health problems. abnormal tsh was recently noted.  pt states he feels well in general. pt says he takes the hyzaar only every few days, due to bp being low-normal.  dizziness is much better PAST MEDICAL HISTORY reviewed and up to date today REVIEW OF SYSTEMS: denies syncope and anxiety PHYSICAL EXAMINATION: see vs page i cannot appreciate a goiter he appears only slightly depressed LAB/XRAY RESULTS: FastTSH              [L]  0.15 uIU/mL   IMPRESSION: suppressed tsh, uncertain etiology depression htn is stil overcontrolled PLAN: see instruction  sheet    Impression & Recommendations:  Problem # 1:  ROUTINE GENERAL MEDICAL EXAM@HEALTH  CARE FACL (ICD-V70.0)  Medications Added to Medication List This Visit: 1)  Fish Oil  .Marland Kitchen.. 1000 mg daily 2)  Losartan Potassium-hctz 50-12.5 Mg Tabs (Losartan potassium-hctz) .Marland Kitchen.. 1 once daily 3)  Citalopram Hydrobromide 10 Mg Tabs (Citalopram hydrobromide) .Marland Kitchen.. 1 qd  Other Orders: Radiology Referral (Radiology) Est. Patient Level V ([address]) Est. Patient 65& > (60454)   Patient Instructions: 1)  reduce hyzaar to 50/12.5, 1/day. 2)  thyroid ultrasound. 3)  return 3 months. 4)  citalopram 10 mg once daily 5)  we discussed the recommendations of the preventive services task force Prescriptions: CITALOPRAM HYDROBROMIDE 10 MG TABS (CITALOPRAM HYDROBROMIDE) 1 qd  #30 x 11   Entered and Authorized by:   Minus Breeding MD   Signed by:   Minus Breeding MD on 07/02/2009   Method used:   Electronically to        [facility]  [phone]* (retail)       [address] [place], [place]  [address]       Ph: [phone] or [phone]       Fax: [phone]   RxID:   [id number] LOSARTAN POTASSIUM-HCTZ 50-12.5 MG TABS (LOSARTAN POTASSIUM-HCTZ) 1 once daily  #30 x 11   Entered and Authorized by:   Minus Breeding MD   Signed by:   Minus Breeding MD on 07/02/2009   Method used:   Electronically to        [facility]  [phone]* (retail)       [address] [place], [place]  [address]       Ph: [phone] or [phone]       Fax: [phone]   RxID:   [id number]   Appended Document: Orders Update    Clinical Lists Changes  Orders: Added new Service order of Prescription Created Electronically [id number]) - Signed

## 2010-06-08 NOTE — Consult Note (Signed)
Summary: [facility]  [facility]   Imported By: Sherian Rein 08/10/2009 09:50:02  _____________________________________________________________________  External Attachment:    Type:   Image     Comment:   External Document

## 2010-06-08 NOTE — Progress Notes (Signed)
Summary: Med change/SAE pt  Phone Note Call from Patient Call back at Home Phone [phone]   Caller: Patient Summary of Call: Pt called requesting to have Venlafaxine changed to Zoloft, Prozac or Remeron because "it is not helping and I want a better known drug". Pt is also requesting to take Tamulosin as needed instead of at bedtime because per pt, " It works to well and I have had a couple of accidents". Please advise, pt insists he is taking both medication as prescribed. Initial call taken by: Margaret Pyle, CMA,  October 21, 2009 11:18 AM  Follow-up for Phone Call        ok to take the tamsulosin in the AM or PM  ok to stop the venlafaxine;  will change to prozac 20 mg per day  please f/u Dr Everardo All as previously planned Follow-up by: Corwin Levins MD,  October 21, 2009 12:13 PM  Additional Follow-up for Phone Call Additional follow up Details #1::        pt informed Additional Follow-up by: Margaret Pyle, CMA,  October 21, 2009 3:34 PM    New/Updated Medications: TAMSULOSIN HCL 0.4 MG CAPS (TAMSULOSIN HCL) 1 by mouth once daily FLUOXETINE HCL 10 MG CAPS (FLUOXETINE HCL) 1 by mouth once daily Prescriptions: FLUOXETINE HCL 10 MG CAPS (FLUOXETINE HCL) 1 by mouth once daily  #30 x 11   Entered and Authorized by:   Corwin Levins MD   Signed by:   Corwin Levins MD on 10/21/2009   Method used:   Electronically to        [facility]  [phone]* (retail)       [address] [place], [place]  [address]       Ph: [phone] or [phone]       Fax: [phone]   RxID:   [id number]

## 2010-06-08 NOTE — Progress Notes (Signed)
Summary: Condition Update  Phone Note Call from Patient Call back at Home Phone [phone]   Caller: Patient Call For: Dr. Leone Payor Reason for Call: Talk to Nurse Summary of Call: ?'s regarding Cymbalta... not helping arthritis or depression... is making pt dizzy  Initial call taken by: Vallarie Mare,  February 01, 2010 10:25 AM  Follow-up for Phone Call        F/U from triage 01-21-10.  Taking Cymbalta at bedtime since 01-21-10, continues w/dizziness throughout the day.  Doesn't feel depression has improved at all.  States he still has arthritis pain,too.  DR.GESSNER PLEASE ADVISE  Follow-up by: Laureen Ochs LPN,  February 01, 2010 12:14 PM  Additional Follow-up for Phone Call Additional follow up Details #1::        depression and pain unlikely to be improved yet but sounds like he is not tolerating the drug stop Cymbalta start mirtazipine 7.5 mg at bedtime - will help him sleep and improve appetitie rx written start it tomorrow night have him call us back in 1-2 weeks with update Additional Follow-up by: Iva Boop MD, Clementeen Graham,  February 01, 2010 1:48 PM    Additional Follow-up for Phone Call Additional follow up Details #2::    Above MD orders reviewed with patient.   Follow-up by: Laureen Ochs LPN,  February 01, 2010 1:56 PM  New/Updated Medications: MIRTAZAPINE 7.5 MG TABS (MIRTAZAPINE) 1 by mouth at bedtime Prescriptions: MIRTAZAPINE 7.5 MG TABS (MIRTAZAPINE) 1 by mouth at bedtime  #30 x 2   Entered and Authorized by:   Iva Boop MD, [facility]   Signed by:   Iva Boop MD, FACG on 02/01/2010   Method used:   Electronically to        [facility]  [phone]* (retail)       [address] [place], [place]  [address]       Ph: [phone] or [phone]       Fax: [phone]   RxID:   [id number]

## 2010-06-08 NOTE — Progress Notes (Signed)
Summary: Triage  Phone Note Call from Patient Call back at Home Phone [phone]   Caller: Spouse June Call For: Dr. Leone Payor Reason for Call: Talk to Nurse Complaint: Urinary/GYN Problems Summary of Call: Wants to know if he can take Celebrex with his other meds.....Dr. Leone Payor also mentioned a program at [facility] for his back condition Initial call taken by: Karna Christmas,  March 11, 2010 10:29 AM  Follow-up for Phone Call        Dr Leone Payor please advise Darcey Nora RN, [facility]  March 11, 2010 10:46 AM  Additional Follow-up for Phone Call Additional follow up Details #1::        I t looks ok to me as far as meds but he has some kidney dysfunction so would not take unless oked by PCP I believe I had mentioned a referral to rehab - Dr. Riley Kill and [facility] Banker but he should discuss with back surgeon or PCP Additional Follow-up by: Iva Boop MD, Clementeen Graham,  March 11, 2010 1:17 PM    Additional Follow-up for Phone Call Additional follow up Details #2::    patient and wife advised of Dr Marvell Fuller recommendations Follow-up by: Darcey Nora RN, CGRN,  March 11, 2010 2:19 PM

## 2010-06-08 NOTE — Progress Notes (Signed)
Summary: Rx change req  Phone Note Call from Patient Call back at Home Phone [phone]   Caller: Patient Summary of Call: Pt called stating that Rx recieved today for Venlafaxine HCL ER is too expensive. Pt is requesting to have Rx changed back to Venlafaxine HCL. Initial call taken by: Margaret Pyle, CMA,  October 12, 2009 4:23 PM  Follow-up for Phone Call        i changed to 75 mg two times a day (not the -xr).  [facility] Follow-up by: Minus Breeding MD,  October 13, 2009 6:50 AM  Additional Follow-up for Phone Call Additional follow up Details #1::        pt informed Additional Follow-up by: Margaret Pyle, CMA,  October 13, 2009 9:01 AM    New/Updated Medications: VENLAFAXINE HCL 75 MG TABS (VENLAFAXINE HCL) 1 tab two times a day Prescriptions: VENLAFAXINE HCL 75 MG TABS (VENLAFAXINE HCL) 1 tab two times a day  #60 x 11   Entered and Authorized by:   Minus Breeding MD   Signed by:   Minus Breeding MD on 10/13/2009   Method used:   Electronically to        [facility]  [phone]* (retail)       [address] [place], [place]  [address]       Ph: [phone] or [phone]       Fax: [phone]   RxID:   [id number]

## 2010-06-10 NOTE — Assessment & Plan Note (Signed)
Summary: congestion/#/cd   Vital Signs:  Patient profile:   77 year old male Height:      70 inches (177.80 cm) Weight:      168.50 pounds (76.59 kg) BMI:     24.26 O2 Sat:      96 % on Room air Temp:     97.9 degrees F (36.61 degrees C) oral Pulse rate:   72 / minute BP sitting:   148 / 72  (right arm) Cuff size:   regular  Vitals Entered By: Brenton Grills CMA Duncan Dull) (May 27, 2010 10:57 AM)  O2 Flow:  Room air CC: Pt c/o wheezing and congestion x 3 weeks, SOB with exertion/aj Is Patient Diabetic? No   Referring Provider:  n/a Primary Provider:  Romero Belling, MD  CC:  Pt c/o wheezing and congestion x 3 weeks and SOB with exertion/aj.  History of Present Illness: 1 week of slight wheezing in the chest, and assoc congestion.  he has little or no cough. pt has many years of low-back pain.  he had surgery by dr Venetia Maxon.  he says vicodin "was like taking an aspirin."  he feels the pain causes depression.    Current Medications (verified): 1)  Multivitamins   Tabs (Multiple Vitamin) .... Take 1 By Mouth Qd 2)  Adult Aspirin Low Strength 81 Mg  Tbdp (Aspirin) .... Take 1 By Mouth Qd 3)  Prilosec 20 Mg Cpdr (Omeprazole) .Marland Kitchen.. 1 Tab By Mouth Two Times A Day 4)  Calcitriol 0.25 Mcg Caps (Calcitriol) .Marland Kitchen.. 1 Tab Once Daily 5)  Crestor 20 Mg Tabs (Rosuvastatin Calcium) .... Take One Tablet By Mouth Daily. 6)  Losartan Potassium-Hctz 100-12.5 Mg Tabs (Losartan Potassium-Hctz) .Marland Kitchen.. 1 Once Daily 7)  Tamsulosin Hcl 0.4 Mg Caps (Tamsulosin Hcl) .Marland Kitchen.. 1 By Mouth Once Daily As Needed 8)  Norvasc 10 Mg Tabs (Amlodipine Besylate) .... 2 Tablets Once Daily (20mg ) 9)  Mirtazapine 7.5 Mg Tabs (Mirtazapine) .Marland Kitchen.. 1 By Mouth At Bedtime 10)  Spiriva Handihaler 18 Mcg Caps (Tiotropium Bromide Monohydrate) .Marland Kitchen.. 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         no has sllight doe.  no sore throat or earache.    Physical Exam  General:  Well developed, well nourished, in no acute distress.  Head:  head: no deformity eyes: no periorbital swelling, no proptosis external nose and ears are normal mouth: no lesion seen Lungs:  Clear to auscultation bilaterally. Normal respiratory effort.   i do not appreciate wheezing   Impression & Recommendations:  Problem # 1:  COPD (ICD-496) mild exac due to #3  Problem # 2:  ARTHRITIS, SPINE (ICD-721.90) pain needs increased rx.  Problem # 3:  acute bronchitis new  Medications Added to Medication List This Visit: 1)  Norvasc 10 Mg Tabs (Amlodipine besylate) .... 2 tablets once daily (20mg ) 2)  Oxycodone Hcl 5 Mg Tabs (Oxycodone hcl) .Marland Kitchen.. 1-2 every 4 hrs as needed for pain  Other Orders: T-2 View CXR (71020TC) Est. Patient Level IV (78295)  Patient Instructions: 1)  check chest x-ray.  please call [phone] to hear your test results. 2)  take symbicort-160, 1 puff two times a day.  rinse mouth after using.   3)  also continue spireva 4)  physical is due in approx 6 weeks.   5)  oxycodone 5 mg.  1-2 every 4 hrs as needed for pain. Prescriptions: OXYCODONE HCL 5 MG TABS (OXYCODONE HCL) 1-2 every 4 hrs as needed for pain  #50 x 0   Entered and Authorized by:   Minus Breeding MD   Signed by:   Minus Breeding MD on 05/27/2010   Method used:   Print then Give to Patient   RxID:    [id number]    Orders Added: 1)  T-2 View CXR [71020TC] 2)  Est. Patient Level IV [62130]

## 2010-06-10 NOTE — Progress Notes (Signed)
Summary: Med refill req/SAE pt  Phone Note Call from Patient Call back at Home Phone [phone]   Caller: Spouse Summary of Call: Pt's spouse called requesting refill of Spiriva. Per med list medication was removed from list by Dr Ludwig Clarks office without a reason and per spouse pt has COPD. Okay to add med and refill?  Initial call taken by: Margaret Pyle, CMA,  May 05, 2010 4:49 PM  Follow-up for Phone Call        should  be ok - done per emr Follow-up by: Corwin Levins MD,  May 05, 2010 4:52 PM  Additional Follow-up for Phone Call Additional follow up Details #1::        Pt's spouse advised Additional Follow-up by: Margaret Pyle, CMA,  May 06, 2010 8:31 AM    New/Updated Medications: SPIRIVA HANDIHALER 18 MCG CAPS (TIOTROPIUM BROMIDE MONOHYDRATE) 1 puff once daily Prescriptions: SPIRIVA HANDIHALER 18 MCG CAPS (TIOTROPIUM BROMIDE MONOHYDRATE) 1 puff once daily  #30 x 11   Entered and Authorized by:   Corwin Levins MD   Signed by:   Corwin Levins MD on 05/05/2010   Method used:   [facility] to        [facility]  [phone]* (retail)       [address] [place], [place]  [address]       Ph: [phone] or [phone]       Fax: [phone]   RxID:   [id number]

## 2010-06-10 NOTE — Letter (Signed)
Summary: [facility]   [facility]   Imported By: Roderic Ovens 05/20/2010 16:32:11  _____________________________________________________________________  External Attachment:    Type:   Image     Comment:   External Document

## 2010-06-11 NOTE — Letter (Signed)
Summary: [facility] Note  [facility] Note   Imported By: Roderic Ovens 06/01/2009 13:49:54  _____________________________________________________________________  External Attachment:    Type:   Image     Comment:   External Document

## 2010-06-15 ENCOUNTER — Encounter (HOSPITAL_BASED_OUTPATIENT_CLINIC_OR_DEPARTMENT_OTHER): Payer: MEDICARE | Admitting: Internal Medicine

## 2010-06-15 ENCOUNTER — Other Ambulatory Visit: Payer: Self-pay | Admitting: Internal Medicine

## 2010-06-15 DIAGNOSIS — D638 Anemia in other chronic diseases classified elsewhere: Secondary | ICD-10-CM

## 2010-06-15 DIAGNOSIS — N189 Chronic kidney disease, unspecified: Secondary | ICD-10-CM

## 2010-06-15 LAB — CBC WITH DIFFERENTIAL/PLATELET
Basophils Absolute: 0 10*3/uL (ref 0.0–0.1)
Eosinophils Absolute: 0.4 10*3/uL (ref 0.0–0.5)
HCT: 31.4 % — ABNORMAL LOW (ref 38.4–49.9)
HGB: 10.7 g/dL — ABNORMAL LOW (ref 13.0–17.1)
LYMPH%: 25.9 % (ref 14.0–49.0)
MCV: 98.9 fL — ABNORMAL HIGH (ref 79.3–98.0)
MONO#: 0.9 10*3/uL (ref 0.1–0.9)
MONO%: 13.6 % (ref 0.0–14.0)
NEUT#: 3.7 10*3/uL (ref 1.5–6.5)
Platelets: 104 10*3/uL — ABNORMAL LOW (ref 140–400)
WBC: 6.9 10*3/uL (ref 4.0–10.3)

## 2010-06-22 ENCOUNTER — Telehealth: Payer: Self-pay | Admitting: Endocrinology

## 2010-06-23 ENCOUNTER — Encounter: Payer: Self-pay | Admitting: Endocrinology

## 2010-06-23 ENCOUNTER — Other Ambulatory Visit: Payer: Self-pay | Admitting: Internal Medicine

## 2010-06-23 ENCOUNTER — Encounter (HOSPITAL_BASED_OUTPATIENT_CLINIC_OR_DEPARTMENT_OTHER): Payer: MEDICARE | Admitting: Internal Medicine

## 2010-06-23 DIAGNOSIS — N189 Chronic kidney disease, unspecified: Secondary | ICD-10-CM

## 2010-06-23 DIAGNOSIS — D638 Anemia in other chronic diseases classified elsewhere: Secondary | ICD-10-CM

## 2010-06-23 DIAGNOSIS — D509 Iron deficiency anemia, unspecified: Secondary | ICD-10-CM

## 2010-06-23 DIAGNOSIS — I10 Essential (primary) hypertension: Secondary | ICD-10-CM

## 2010-06-23 LAB — CBC WITH DIFFERENTIAL/PLATELET
BASO%: 0.2 % (ref 0.0–2.0)
Eosinophils Absolute: 0.6 10*3/uL — ABNORMAL HIGH (ref 0.0–0.5)
HCT: 30.8 % — ABNORMAL LOW (ref 38.4–49.9)
LYMPH%: 25.8 % (ref 14.0–49.0)
MCHC: 34.2 g/dL (ref 32.0–36.0)
MCV: 99.4 fL — ABNORMAL HIGH (ref 79.3–98.0)
MONO#: 1 10*3/uL — ABNORMAL HIGH (ref 0.1–0.9)
MONO%: 16.7 % — ABNORMAL HIGH (ref 0.0–14.0)
NEUT%: 47 % (ref 39.0–75.0)
Platelets: 104 10*3/uL — ABNORMAL LOW (ref 140–400)
RBC: 3.1 10*6/uL — ABNORMAL LOW (ref 4.20–5.82)

## 2010-06-30 NOTE — Progress Notes (Signed)
Summary: Oxycodone  Phone Note Call from Patient Call back at Home Phone [phone]   Caller: Spouse Summary of Call: Pt's wife called for pt requesting refill of Oxycocone Initial call taken by: Brenton Grills CMA Duncan Dull),  June 22, 2010 4:43 PM  Follow-up for Phone Call        i printed Follow-up by: Minus Breeding MD,  June 23, 2010 7:55 AM  Additional Follow-up for Phone Call Additional follow up Details #1::        pt's spouse informed. Rx placed upfront in cabinet ready for pickup. Additional Follow-up by: Brenton Grills CMA Duncan Dull),  June 23, 2010 8:29 AM    Prescriptions: OXYCODONE HCL 5 MG TABS (OXYCODONE HCL) 1-2 every 4 hrs as needed for pain  #50 x 0   Entered and Authorized by:   Minus Breeding MD   Signed by:   Minus Breeding MD on 06/23/2010   Method used:   Print then Give to Patient   RxID:   [id number]

## 2010-07-12 ENCOUNTER — Encounter (INDEPENDENT_AMBULATORY_CARE_PROVIDER_SITE_OTHER): Payer: MEDICARE | Admitting: Endocrinology

## 2010-07-12 ENCOUNTER — Other Ambulatory Visit: Payer: Self-pay | Admitting: Endocrinology

## 2010-07-12 ENCOUNTER — Encounter: Payer: Self-pay | Admitting: Endocrinology

## 2010-07-12 ENCOUNTER — Other Ambulatory Visit: Payer: MEDICARE

## 2010-07-12 DIAGNOSIS — Z0000 Encounter for general adult medical examination without abnormal findings: Secondary | ICD-10-CM

## 2010-07-12 DIAGNOSIS — Z79899 Other long term (current) drug therapy: Secondary | ICD-10-CM

## 2010-07-12 DIAGNOSIS — D509 Iron deficiency anemia, unspecified: Secondary | ICD-10-CM

## 2010-07-12 DIAGNOSIS — Z125 Encounter for screening for malignant neoplasm of prostate: Secondary | ICD-10-CM

## 2010-07-12 DIAGNOSIS — E785 Hyperlipidemia, unspecified: Secondary | ICD-10-CM

## 2010-07-12 DIAGNOSIS — R609 Edema, unspecified: Secondary | ICD-10-CM

## 2010-07-12 DIAGNOSIS — R7309 Other abnormal glucose: Secondary | ICD-10-CM

## 2010-07-12 DIAGNOSIS — E875 Hyperkalemia: Secondary | ICD-10-CM

## 2010-07-12 LAB — HEPATIC FUNCTION PANEL
ALT: 14 U/L (ref 0–53)
Albumin: 3.9 g/dL (ref 3.5–5.2)
Alkaline Phosphatase: 77 U/L (ref 39–117)
Bilirubin, Direct: 0.2 mg/dL (ref 0.0–0.3)
Total Protein: 6.9 g/dL (ref 6.0–8.3)

## 2010-07-12 LAB — CBC WITH DIFFERENTIAL/PLATELET
Basophils Relative: 0.2 % (ref 0.0–3.0)
Eosinophils Absolute: 0.7 10*3/uL (ref 0.0–0.7)
Eosinophils Relative: 14.7 % — ABNORMAL HIGH (ref 0.0–5.0)
Hemoglobin: 12 g/dL — ABNORMAL LOW (ref 13.0–17.0)
Lymphocytes Relative: 34.5 % (ref 12.0–46.0)
MCHC: 34.7 g/dL (ref 30.0–36.0)
MCV: 98.3 fl (ref 78.0–100.0)
Monocytes Absolute: 0.8 10*3/uL (ref 0.1–1.0)
Neutro Abs: 1.7 10*3/uL (ref 1.4–7.7)
Neutrophils Relative %: 33.7 % — ABNORMAL LOW (ref 43.0–77.0)
RBC: 3.51 Mil/uL — ABNORMAL LOW (ref 4.22–5.81)
WBC: 5 10*3/uL (ref 4.5–10.5)

## 2010-07-12 LAB — LIPID PANEL
Total CHOL/HDL Ratio: 3
VLDL: 116.2 mg/dL — ABNORMAL HIGH (ref 0.0–40.0)

## 2010-07-12 LAB — CONVERTED CEMR LAB
Calcium, Total (PTH): 9.2 mg/dL (ref 8.4–10.5)
PTH: 149.3 pg/mL — ABNORMAL HIGH (ref 14.0–72.0)

## 2010-07-12 LAB — LDL CHOLESTEROL, DIRECT: Direct LDL: 86.4 mg/dL

## 2010-07-12 LAB — BASIC METABOLIC PANEL
CO2: 22 mEq/L (ref 19–32)
Chloride: 107 mEq/L (ref 96–112)
Creatinine, Ser: 1.9 mg/dL — ABNORMAL HIGH (ref 0.4–1.5)
Potassium: 4.8 mEq/L (ref 3.5–5.1)
Sodium: 138 mEq/L (ref 135–145)

## 2010-07-12 LAB — IBC PANEL
Iron: 122 ug/dL (ref 42–165)
Saturation Ratios: 37.9 % (ref 20.0–50.0)
Transferrin: 230.2 mg/dL (ref 212.0–360.0)

## 2010-07-12 LAB — PSA: PSA: 0.91 ng/mL (ref 0.10–4.00)

## 2010-07-12 LAB — URINALYSIS, ROUTINE W REFLEX MICROSCOPIC
Bilirubin Urine: NEGATIVE
Ketones, ur: NEGATIVE
Total Protein, Urine: 30
pH: 5.5 (ref 5.0–8.0)

## 2010-07-15 NOTE — Letter (Addendum)
Summary: [facility]  Si Gaul MD   Imported By: Lester Northmoor 07/06/2010 08:00:49  _____________________________________________________________________  External Attachment:    Type:   Image     Comment:   External Document

## 2010-07-26 ENCOUNTER — Encounter: Payer: Self-pay | Admitting: Endocrinology

## 2010-07-26 ENCOUNTER — Telehealth: Payer: Self-pay | Admitting: Endocrinology

## 2010-07-26 ENCOUNTER — Other Ambulatory Visit: Payer: Self-pay | Admitting: Endocrinology

## 2010-07-26 ENCOUNTER — Ambulatory Visit (INDEPENDENT_AMBULATORY_CARE_PROVIDER_SITE_OTHER): Payer: MEDICARE | Admitting: Endocrinology

## 2010-07-26 ENCOUNTER — Ambulatory Visit (INDEPENDENT_AMBULATORY_CARE_PROVIDER_SITE_OTHER)
Admission: RE | Admit: 2010-07-26 | Discharge: 2010-07-26 | Disposition: A | Discharge status: Home / Self Care | Payer: MEDICARE | Source: Ambulatory Visit | Attending: Endocrinology | Admitting: Endocrinology

## 2010-07-26 DIAGNOSIS — R05 Cough: Secondary | ICD-10-CM

## 2010-07-26 DIAGNOSIS — R059 Cough, unspecified: Secondary | ICD-10-CM | POA: Insufficient documentation

## 2010-07-26 DIAGNOSIS — J209 Acute bronchitis, unspecified: Secondary | ICD-10-CM

## 2010-07-27 NOTE — Assessment & Plan Note (Signed)
Summary: 6 wk yearly -- d/t--stc   Vital Signs:  Patient profile:   77 year old male Height:      70 inches (177.80 cm) Weight:      168.13 pounds (76.42 kg) BMI:     24.21 O2 Sat:      89 % on Room air Temp:     97.5 degrees F (36.39 degrees C) oral Pulse rate:   96 / minute Pulse rhythm:   irregular BP sitting:   106 / 68  (left arm) Cuff size:   regular  Vitals Entered By: Brenton Grills CMA Duncan Dull) (July 12, 2010 9:38 AM)  O2 Flow:  Room air CC: Yearly Medicare Wellness/aj Is Patient Diabetic? No   Referring Provider:  n/a Primary Provider:  Romero Belling, MD  CC:  Yearly Medicare Wellness/aj.  History of Present Illness: here for regular wellness examination.  He's feeling pretty well in general, except for chronic low-back pain.  procrit did not help anemia.     Current Medications (verified): 1)  Multivitamins   Tabs (Multiple Vitamin) .... Take 1 By Mouth Qd 2)  Adult Aspirin Low Strength 81 Mg  Tbdp (Aspirin) .... Take 1 By Mouth Qd 3)  Prilosec 20 Mg Cpdr (Omeprazole) .Marland Kitchen.. 1 Tab By Mouth Two Times A Day 4)  Calcitriol 0.25 Mcg Caps (Calcitriol) .Marland Kitchen.. 1 Tab Once Daily 5)  Crestor 20 Mg Tabs (Rosuvastatin Calcium) .... Take One Tablet By Mouth Daily. 6)  Losartan Potassium-Hctz 100-12.5 Mg Tabs (Losartan Potassium-Hctz) .Marland Kitchen.. 1 Once Daily 7)  Tamsulosin Hcl 0.4 Mg Caps (Tamsulosin Hcl) .Marland Kitchen.. 1 By Mouth Once Daily As Needed 8)  Norvasc 10 Mg Tabs (Amlodipine Besylate) .... 2 Tablets Once Daily (20mg ) 9)  Spiriva Handihaler 18 Mcg Caps (Tiotropium Bromide Monohydrate) .Marland Kitchen.. 1 Puff Once Daily 10)  Oxycodone Hcl 5 Mg Tabs (Oxycodone Hcl) .Marland Kitchen.. 1-2 Every 4 Hrs As Needed For Pain  Allergies (verified): No Known Drug Allergies  Past History:  Past Medical History: Last updated: 03/03/2010 FHF/CVA LE Varicosities Hyperkalemia due to Ace Inhibitors SECONDARY HYPERPARATHYROIDISM (ICD-588.81) CEREBROVASCULAR DISEASE (ICD-437.9) DYSLIPIDEMIA (ICD-272.4) PERIPHERAL  VASCULAR DISEASE (ICD-443.9) HYPERTENSION (ICD-401.9) ATRIAL FIBRILLATION (ICD-427.31); post operative ANEMIA OF CHRONIC DISEASE (ICD-285.29) GERD (ICD-530.81) RENAL INSUFFICIENCY (ICD-588.9) ARTHRITIS, SPINE (ICD-721.90) DJD with Chronic low back pain COPD (ICD-496) OSTEOPOROSIS (ICD-733.00) HIATAL HERNIA (ICD-553.3) DEPRESSION  Family History: Reviewed history from 01/18/2010 and no changes required.  Mother died at 57 of unknown causes.  Father died at 18   of CVA.  He also had a history of coronary disease.  Sister died at 21 in a drowning accident.  No FH of Colon Cancer:  Social History: Reviewed history from 01/18/2010 and no changes required. retired married Patient is a former smoker. -stopped 2003 Alcohol Use - no Daily Caffeine Use-2.5 cups daily Illicit Drug Use - no Patient does not get regular exercise.   Review of Systems  The patient denies fever, weight loss, weight gain, vision loss, decreased hearing, chest pain, syncope, dyspnea on exertion, prolonged cough, headaches, abdominal pain, melena, hematochezia, severe indigestion/heartburn, and suspicious skin lesions.         no change in chronic doe (he does not wish further eval).  he did not tolerate cymbalta or effexor. celexa did not help.  no insomnia.    Physical Exam  General:  Well developed, well nourished, in no acute distress.  Head:  head: no deformity eyes: no periorbital swelling, no proptosis external nose and ears are normal mouth: no lesion  seen Neck:  Supple without thyroid enlargement or tenderness.  Chest Wall:  increased a-p diameter Lungs:  Clear to auscultation bilaterally. Normal respiratory effort.  Heart:  irreg rhythm, normal rate, no murmur Abdomen:  abdomen is soft, nontender.  no hepatosplenomegaly.   not distended.  no hernia  Rectal:  normal external and internal exam.  heme neg  Prostate:  Normal size prostate without masses or tenderness.  Msk:  muscle bulk and  strength are grossly normal.  no obvious joint swelling.  gait is normal and steady  Pulses:  dorsalis pedis are absent bilat.  no carotid bruit  Extremities:  no deformity.  no ulcer on the feet.  feet are of normal color and temp.   trace right pedal edema and trace left pedal edema.  mycotic adn overgrown toenails.   there are bilat varicosities Neurologic:  cn 2-12 grossly intact.   readily moves all 4's.   sensation is intact to touch on the feet  Skin:  normal texture and temp.  no rash.  not diaphoretic.  there are psoriatic plaques on the knees and buttocks.    Cervical Nodes:  No significant adenopathy.  Psych:  Alert and cooperative; normal mood and affect; normal attention span and concentration.     Impression & Recommendations:  Problem # 1:  ROUTINE GENERAL MEDICAL EXAM@HEALTH  CARE FACL (ICD-V70.0)  Other Orders: T-Parathyroid Hormone, Intact w/ Calcium (47829-56213) TLB-Lipid Panel (80061-LIPID) TLB-BMP (Basic Metabolic Panel-BMET) (80048-METABOL) TLB-CBC Platelet - w/Differential (85025-CBCD) TLB-Hepatic/Liver Function Pnl (80076-HEPATIC) TLB-TSH (Thyroid Stimulating Hormone) (84443-TSH) TLB-A1C / Hgb A1C (Glycohemoglobin) (83036-A1C) TLB-Udip w/ Micro (81001-URINE) TLB-IBC Pnl (Iron/FE;Transferrin) (83550-IBC) TLB-PSA (Prostate Specific Antigen) (84153-PSA) Est. Patient 65& > (08657)  Patient Instructions: 1)  please consider these measures for your health:  minimize alcohol.  do not use tobacco products.  have a colonoscopy at least every 10 years from age 54.  keep firearms safely stored.  always use seat belts.  have working smoke alarms in your home.  see an eye doctor and dentist regularly.  never drive under the influence of alcohol or drugs (including prescription drugs).  those with fair skin should take precautions against the sun. 2)  please let me know what your wishes would be, if artificial life support measures should become necessary.  it is  critically important to prevent falling down (keep floor areas well-lit, dry, and free of loose objects) 3)  blood tests are being ordered for you today.  please call [phone] to hear your test results. 4)  Please schedule a follow-up appointment in 3 months. 5)  here is a refill of your pain medication.   Prescriptions: OXYCODONE HCL 5 MG TABS (OXYCODONE HCL) 1-2 every 4 hrs as needed for pain  #100 x 0   Entered and Authorized by:   Minus Breeding MD   Signed by:   Minus Breeding MD on 07/12/2010   Method used:   Print then Give to Patient   RxID:   [id number]    Orders Added: 1)  T-Parathyroid Hormone, Intact w/ Calcium [36644-03474] 2)  TLB-Lipid Panel [80061-LIPID] 3)  TLB-BMP (Basic Metabolic Panel-BMET) [80048-METABOL] 4)  TLB-CBC Platelet - w/Differential [85025-CBCD] 5)  TLB-Hepatic/Liver Function Pnl [80076-HEPATIC] 6)  TLB-TSH (Thyroid Stimulating Hormone) [84443-TSH] 7)  TLB-A1C / Hgb A1C (Glycohemoglobin) [83036-A1C] 8)  TLB-Udip w/ Micro [81001-URINE] 9)  TLB-IBC Pnl (Iron/FE;Transferrin) [83550-IBC] 10)  TLB-PSA (Prostate Specific Antigen) [84153-PSA] 11)  Est. Patient 65& > [25956]

## 2010-07-28 LAB — BONE MARROW EXAM

## 2010-08-02 ENCOUNTER — Telehealth: Payer: Self-pay | Admitting: Internal Medicine

## 2010-08-02 NOTE — Telephone Encounter (Signed)
Pt with hx of GERD, Anemia, Abd. Pain and Peptic Ulcer. Last FLEX SIG 08/08/08 showing Sigmoid Diverticulosis; last OV 03/03/10. Pt's wife called to report he is having pain in his back and his hip. Since pt has had an ulcer, 1) can he take [facility]?  2) if he can take it, will you prescribe it?   Thanks.

## 2010-08-02 NOTE — Telephone Encounter (Signed)
1) he may use Mobic if needed as long as he is on a PPI - last I knew was on Prilosec (omeprazole bod) 2) I will not be there prescriber Would need to see PCP or if another MD recommended needs to go to them

## 2010-08-02 NOTE — Telephone Encounter (Signed)
Notified pt that he may go on [facility] if he's on a PPI. ( Pt is on Prilosec). Dr Leone Payor prefers his PCP or another MD order the med. Pt stated understanding.

## 2010-08-05 NOTE — Progress Notes (Signed)
Summary: Edison Simon  Phone Note Call from Patient Call back at Home Phone [phone]   Caller: Patient Summary of Call: Pt called stating he has 1 week of chest congestion, cough, nasal drainage and mild ST. No fever or SOB. Pt is requesting ABX and says he recently has appt with [facility]. Please advise. Initial call taken by: Margaret Pyle, CMA,  July 26, 2010 9:26 AM  Follow-up for Phone Call        he had appt, but this problem was not addressed.  i can see as add-on today. Follow-up by: Minus Breeding MD,  July 26, 2010 9:39 AM  Additional Follow-up for Phone Call Additional follow up Details #1::        Spouse advised and will call back to schedule appt Additional Follow-up by: Margaret Pyle, CMA,  July 26, 2010 10:27 AM

## 2010-08-05 NOTE — Assessment & Plan Note (Signed)
Summary: CONGESTION /NWS   Vital Signs:  Patient profile:   77 year old male Height:      70 inches (177.80 cm) Weight:      165.25 pounds (75.11 kg) BMI:     23.80 O2 Sat:      90 % on Room air Temp:     98.8 degrees F (37.11 degrees C) oral Pulse rate:   87 / minute BP sitting:   110 / 60  (right arm) Cuff size:   regular  Vitals Entered By: Brenton Grills CMA Duncan Dull) (July 26, 2010 2:21 PM)  O2 Flow:  Room air CC: PT c/o Chest Congestion, cough, SOB/aj Is Patient Diabetic? No   Referring Provider:  n/a Primary Provider:  Romero Belling, MD  CC:  PT c/o Chest Congestion, cough, and SOB/aj.  History of Present Illness: pt states few weeks of prod cough in the chest, and assoc sore throat.  his chronic sob is worse recently.    Current Medications (verified): 1)  Multivitamins   Tabs (Multiple Vitamin) .... Take 1 By Mouth Qd 2)  Adult Aspirin Low Strength 81 Mg  Tbdp (Aspirin) .... Take 1 By Mouth Qd 3)  Prilosec 20 Mg Cpdr (Omeprazole) .Marland Kitchen.. 1 Tab By Mouth Two Times A Day 4)  Calcitriol 0.25 Mcg Caps (Calcitriol) .Marland Kitchen.. 1 Tab Once Daily 5)  Crestor 20 Mg Tabs (Rosuvastatin Calcium) .... Take One Tablet By Mouth Daily. 6)  Losartan Potassium-Hctz 100-12.5 Mg Tabs (Losartan Potassium-Hctz) .Marland Kitchen.. 1 Once Daily 7)  Tamsulosin Hcl 0.4 Mg Caps (Tamsulosin Hcl) .Marland Kitchen.. 1 By Mouth Once Daily As Needed 8)  Norvasc 10 Mg Tabs (Amlodipine Besylate) .... 2 Tablets Once Daily (20mg ) 9)  Spiriva Handihaler 18 Mcg Caps (Tiotropium Bromide Monohydrate) .Marland Kitchen.. 1 Puff Once Daily 10)  Oxycodone Hcl 5 Mg Tabs (Oxycodone Hcl) .Marland Kitchen.. 1-2 Every 4 Hrs As Needed For Pain  Allergies (verified): No Known Drug Allergies  Past History:  Past Medical History: Last updated: 03/03/2010 FHF/CVA LE Varicosities Hyperkalemia due to Ace Inhibitors SECONDARY HYPERPARATHYROIDISM (ICD-588.81) CEREBROVASCULAR DISEASE (ICD-437.9) DYSLIPIDEMIA (ICD-272.4) PERIPHERAL VASCULAR DISEASE (ICD-443.9) HYPERTENSION  (ICD-401.9) ATRIAL FIBRILLATION (ICD-427.31); post operative ANEMIA OF CHRONIC DISEASE (ICD-285.29) GERD (ICD-530.81) RENAL INSUFFICIENCY (ICD-588.9) ARTHRITIS, SPINE (ICD-721.90) DJD with Chronic low back pain COPD (ICD-496) OSTEOPOROSIS (ICD-733.00) HIATAL HERNIA (ICD-553.3) DEPRESSION  Review of Systems  The patient denies fever.    Physical Exam  General:  normal appearance.   Head:  head: no deformity eyes: no periorbital swelling, no proptosis external nose and ears are normal mouth: no lesion seen Lungs:  Clear to auscultation bilaterally. Normal respiratory effort.    Impression & Recommendations:  Problem # 1:  ACUTE BRONCHITIS (ICD-466.0) Assessment New  Medications Added to Medication List This Visit: 1)  Azithromycin 500 Mg Tabs (Azithromycin) .Marland Kitchen.. 1 tab once daily 2)  Symbicort 160-4.5 Mcg/act Aero (Budesonide-formoterol fumarate) .Marland Kitchen.. 1 puff two times a day  Other Orders: T-2 View CXR (71020TC) Est. Patient Level III (11914)  Patient Instructions: 1)  let me know if you want to see a lung specialist.  i think you should. 2)  recheck chest x-ray today.  3)  i sent a prescription for an antibiotic to your pharmacy 4)  you should use your "symbicort" inhaler, 1 puff two times a day.  rinse mouth after using.   5)  (update: i left message on phone-tree:  rx as we discussed) Prescriptions: AZITHROMYCIN 500 MG TABS (AZITHROMYCIN) 1 tab once daily  #6 x 0   Entered  and Authorized by:   Minus Breeding MD   Signed by:   Minus Breeding MD on 07/26/2010   Method used:   Electronically to        [facility]  [phone]* (retail)       [address] [place], [place]  [address]       Ph: [phone] or [phone]       Fax: [phone]   RxID:   [id number]    Orders Added: 1)  T-2 View CXR [71020TC] 2)  Est. Patient Level III [95188]

## 2010-08-06 ENCOUNTER — Telehealth: Payer: Self-pay

## 2010-08-06 NOTE — Telephone Encounter (Signed)
Pt called requesting Rx for Mobic for Arthritis inflammation. Please advise.

## 2010-08-09 ENCOUNTER — Other Ambulatory Visit: Payer: Self-pay | Admitting: Internal Medicine

## 2010-08-09 MED ORDER — MIRTAZAPINE 7.5 MG PO TABS: 7.5000 mg | ORAL_TABLET | Freq: Every day | ORAL | Status: DC

## 2010-08-09 NOTE — Telephone Encounter (Addendum)
Patient is requesting a refill on Mirtazipine 7.5 mg this was prescribed to him during a phone note on 02/01/10.  He says helped with his appetite and he is requesting a refill.  Dr Leone Payor please advise.  Refilled. See orders

## 2010-08-09 NOTE — Telephone Encounter (Signed)
Patient is advised that RX is at the pharmacy

## 2010-08-18 LAB — BASIC METABOLIC PANEL
CO2: 18 mEq/L — ABNORMAL LOW (ref 19–32)
Calcium: 8.7 mg/dL (ref 8.4–10.5)
Chloride: 124 mEq/L — ABNORMAL HIGH (ref 96–112)
GFR calc Af Amer: >60 mL/min (ref >60)
GFR calc Af Amer: >60 mL/min (ref >60)
GFR calc non Af Amer: >60 mL/min (ref >60)
Potassium: 3.4 mEq/L — ABNORMAL LOW (ref 3.5–5.1)
Sodium: 144 mEq/L (ref 135–145)
Sodium: 147 mEq/L — ABNORMAL HIGH (ref 135–145)

## 2010-08-18 LAB — CBC
Hemoglobin: 8.9 g/dL — ABNORMAL LOW (ref 13.0–17.0)
RBC: 2.64 MIL/uL — ABNORMAL LOW (ref 4.22–5.81)

## 2010-08-18 NOTE — Telephone Encounter (Signed)
Please abstract meds for review prior to rx this med - has the patient been on this med before? thanks

## 2010-08-19 ENCOUNTER — Encounter: Payer: Self-pay | Admitting: Internal Medicine

## 2010-08-19 LAB — CROSSMATCH
ABO/RH(D): A POS
Antibody Screen: NEGATIVE

## 2010-08-19 LAB — BASIC METABOLIC PANEL WITH GFR
BUN: 10 mg/dL (ref 6–23)
BUN: 10 mg/dL (ref 6–23)
BUN: 16 mg/dL (ref 6–23)
BUN: 32 mg/dL — ABNORMAL HIGH (ref 6–23)
BUN: 38 mg/dL — ABNORMAL HIGH (ref 6–23)
BUN: 41 mg/dL — ABNORMAL HIGH (ref 6–23)
BUN: 49 mg/dL — ABNORMAL HIGH (ref 6–23)
BUN: 51 mg/dL — ABNORMAL HIGH (ref 6–23)
BUN: 8 mg/dL (ref 6–23)
CO2: 14 meq/L — ABNORMAL LOW (ref 19–32)
CO2: 15 meq/L — ABNORMAL LOW (ref 19–32)
CO2: 16 meq/L — ABNORMAL LOW (ref 19–32)
CO2: 16 meq/L — ABNORMAL LOW (ref 19–32)
CO2: 16 meq/L — ABNORMAL LOW (ref 19–32)
CO2: 16 meq/L — ABNORMAL LOW (ref 19–32)
CO2: 16 meq/L — ABNORMAL LOW (ref 19–32)
CO2: 17 meq/L — ABNORMAL LOW (ref 19–32)
CO2: 17 meq/L — ABNORMAL LOW (ref 19–32)
Calcium: 8.1 mg/dL — ABNORMAL LOW (ref 8.4–10.5)
Calcium: 8.2 mg/dL — ABNORMAL LOW (ref 8.4–10.5)
Calcium: 8.4 mg/dL (ref 8.4–10.5)
Calcium: 8.4 mg/dL (ref 8.4–10.5)
Calcium: 8.5 mg/dL (ref 8.4–10.5)
Calcium: 8.5 mg/dL (ref 8.4–10.5)
Calcium: 8.5 mg/dL (ref 8.4–10.5)
Calcium: 8.8 mg/dL (ref 8.4–10.5)
Calcium: 8.8 mg/dL (ref 8.4–10.5)
Chloride: 112 meq/L (ref 96–112)
Chloride: 118 meq/L — ABNORMAL HIGH (ref 96–112)
Chloride: 119 meq/L — ABNORMAL HIGH (ref 96–112)
Chloride: 119 meq/L — ABNORMAL HIGH (ref 96–112)
Chloride: 122 meq/L — ABNORMAL HIGH (ref 96–112)
Chloride: 123 meq/L — ABNORMAL HIGH (ref 96–112)
Chloride: 123 meq/L — ABNORMAL HIGH (ref 96–112)
Chloride: 124 meq/L — ABNORMAL HIGH (ref 96–112)
Chloride: 124 meq/L — ABNORMAL HIGH (ref 96–112)
Creatinine, Ser: 1.09 mg/dL (ref 0.4–1.5)
Creatinine, Ser: 1.1 mg/dL (ref 0.4–1.5)
Creatinine, Ser: 1.17 mg/dL (ref 0.4–1.5)
Creatinine, Ser: 1.27 mg/dL (ref 0.4–1.5)
Creatinine, Ser: 1.47 mg/dL (ref 0.4–1.5)
Creatinine, Ser: 1.65 mg/dL — ABNORMAL HIGH (ref 0.4–1.5)
Creatinine, Ser: 1.66 mg/dL — ABNORMAL HIGH (ref 0.4–1.5)
Creatinine, Ser: 2.08 mg/dL — ABNORMAL HIGH (ref 0.4–1.5)
Creatinine, Ser: 2.46 mg/dL — ABNORMAL HIGH (ref 0.4–1.5)
GFR calc non Af Amer: 25 mL/min — ABNORMAL LOW
GFR calc non Af Amer: 31 mL/min — ABNORMAL LOW
GFR calc non Af Amer: 40 mL/min — ABNORMAL LOW
GFR calc non Af Amer: 40 mL/min — ABNORMAL LOW
GFR calc non Af Amer: 46 mL/min — ABNORMAL LOW
GFR calc non Af Amer: 55 mL/min — ABNORMAL LOW
GFR calc non Af Amer: 60 mL/min — ABNORMAL LOW
GFR calc non Af Amer: >60 mL/min
GFR calc non Af Amer: >60 mL/min
Glucose, Bld: 106 mg/dL — ABNORMAL HIGH (ref 70–99)
Glucose, Bld: 107 mg/dL — ABNORMAL HIGH (ref 70–99)
Glucose, Bld: 111 mg/dL — ABNORMAL HIGH (ref 70–99)
Glucose, Bld: 115 mg/dL — ABNORMAL HIGH (ref 70–99)
Glucose, Bld: 117 mg/dL — ABNORMAL HIGH (ref 70–99)
Glucose, Bld: 125 mg/dL — ABNORMAL HIGH (ref 70–99)
Glucose, Bld: 127 mg/dL — ABNORMAL HIGH (ref 70–99)
Glucose, Bld: 135 mg/dL — ABNORMAL HIGH (ref 70–99)
Glucose, Bld: 141 mg/dL — ABNORMAL HIGH (ref 70–99)
Potassium: 2.6 meq/L — CL (ref 3.5–5.1)
Potassium: 2.7 meq/L — CL (ref 3.5–5.1)
Potassium: 3 meq/L — ABNORMAL LOW (ref 3.5–5.1)
Potassium: 3.2 meq/L — ABNORMAL LOW (ref 3.5–5.1)
Potassium: 3.2 meq/L — ABNORMAL LOW (ref 3.5–5.1)
Potassium: 3.3 meq/L — ABNORMAL LOW (ref 3.5–5.1)
Potassium: 3.3 meq/L — ABNORMAL LOW (ref 3.5–5.1)
Potassium: 3.5 meq/L (ref 3.5–5.1)
Potassium: 3.7 meq/L (ref 3.5–5.1)
Sodium: 136 meq/L (ref 135–145)
Sodium: 138 meq/L (ref 135–145)
Sodium: 141 meq/L (ref 135–145)
Sodium: 142 meq/L (ref 135–145)
Sodium: 143 meq/L (ref 135–145)
Sodium: 145 meq/L (ref 135–145)
Sodium: 145 meq/L (ref 135–145)
Sodium: 145 meq/L (ref 135–145)
Sodium: 150 meq/L — ABNORMAL HIGH (ref 135–145)

## 2010-08-19 LAB — BASIC METABOLIC PANEL
BUN: 24 mg/dL — ABNORMAL HIGH (ref 6–23)
BUN: 8 mg/dL (ref 6–23)
BUN: 9 mg/dL (ref 6–23)
BUN: 31 mg/dL — ABNORMAL HIGH (ref 6–23)
CO2: 17 mEq/L — ABNORMAL LOW (ref 19–32)
CO2: 17 mEq/L — ABNORMAL LOW (ref 19–32)
CO2: 17 mEq/L — ABNORMAL LOW (ref 19–32)
CO2: 21 mEq/L (ref 19–32)
Calcium: 8.1 mg/dL — ABNORMAL LOW (ref 8.4–10.5)
Calcium: 8.2 mg/dL — ABNORMAL LOW (ref 8.4–10.5)
Calcium: 8.5 mg/dL (ref 8.4–10.5)
Calcium: 8.6 mg/dL (ref 8.4–10.5)
Calcium: 8.6 mg/dL (ref 8.4–10.5)
Calcium: 9.1 mg/dL (ref 8.4–10.5)
Chloride: 107 mEq/L (ref 96–112)
Chloride: 119 mEq/L — ABNORMAL HIGH (ref 96–112)
Chloride: 122 mEq/L — ABNORMAL HIGH (ref 96–112)
Chloride: 125 mEq/L — ABNORMAL HIGH (ref 96–112)
Chloride: 108 mEq/L (ref 96–112)
Creatinine, Ser: 1.07 mg/dL (ref 0.4–1.5)
Creatinine, Ser: 1.18 mg/dL (ref 0.4–1.5)
Creatinine, Ser: 1.25 mg/dL (ref 0.4–1.5)
Creatinine, Ser: 1.75 mg/dL — ABNORMAL HIGH (ref 0.4–1.5)
Creatinine, Ser: 1.83 mg/dL — ABNORMAL HIGH (ref 0.4–1.5)
GFR calc Af Amer: 33 mL/min — ABNORMAL LOW (ref >60)
GFR calc Af Amer: 37 mL/min — ABNORMAL LOW (ref >60)
GFR calc Af Amer: 46 mL/min — ABNORMAL LOW (ref >60)
GFR calc Af Amer: 48 mL/min — ABNORMAL LOW (ref >60)
GFR calc Af Amer: >60 mL/min (ref >60)
GFR calc non Af Amer: 27 mL/min — ABNORMAL LOW (ref >60)
GFR calc non Af Amer: 56 mL/min — ABNORMAL LOW (ref >60)
GFR calc non Af Amer: 59 mL/min — ABNORMAL LOW (ref >60)
GFR calc non Af Amer: >60 mL/min (ref >60)
Glucose, Bld: 112 mg/dL — ABNORMAL HIGH (ref 70–99)
Glucose, Bld: 117 mg/dL — ABNORMAL HIGH (ref 70–99)
Glucose, Bld: 99 mg/dL (ref 70–99)
Potassium: 2.7 mEq/L — CL (ref 3.5–5.1)
Potassium: 3.1 mEq/L — ABNORMAL LOW (ref 3.5–5.1)
Potassium: 3.2 mEq/L — ABNORMAL LOW (ref 3.5–5.1)
Potassium: 4.1 mEq/L (ref 3.5–5.1)
Potassium: 6.1 mEq/L — ABNORMAL HIGH (ref 3.5–5.1)
Sodium: 141 mEq/L (ref 135–145)
Sodium: 144 mEq/L (ref 135–145)
Sodium: 148 mEq/L — ABNORMAL HIGH (ref 135–145)

## 2010-08-19 LAB — CULTURE, RESPIRATORY W GRAM STAIN

## 2010-08-19 LAB — CBC
HCT: 25.6 % — ABNORMAL LOW (ref 39.0–52.0)
HCT: 26.8 % — ABNORMAL LOW (ref 39.0–52.0)
HCT: 26.9 % — ABNORMAL LOW (ref 39.0–52.0)
HCT: 30.3 % — ABNORMAL LOW (ref 39.0–52.0)
HCT: 31.1 % — ABNORMAL LOW (ref 39.0–52.0)
Hemoglobin: 8.8 g/dL — ABNORMAL LOW (ref 13.0–17.0)
Hemoglobin: 8.8 g/dL — ABNORMAL LOW (ref 13.0–17.0)
Hemoglobin: 9.2 g/dL — ABNORMAL LOW (ref 13.0–17.0)
Hemoglobin: 9.2 g/dL — ABNORMAL LOW (ref 13.0–17.0)
MCHC: 34.3 g/dL (ref 30.0–36.0)
MCHC: 34.3 g/dL (ref 30.0–36.0)
MCHC: 34.3 g/dL (ref 30.0–36.0)
MCHC: 34.4 g/dL (ref 30.0–36.0)
MCHC: 34.7 g/dL (ref 30.0–36.0)
MCV: 100.7 fL — ABNORMAL HIGH (ref 78.0–100.0)
MCV: 98.4 fL (ref 78.0–100.0)
MCV: 99.7 fL (ref 78.0–100.0)
MCV: 99.3 fL (ref 78.0–100.0)
Platelets: 206 10*3/uL (ref 150–400)
Platelets: 217 10*3/uL (ref 150–400)
Platelets: 222 K/uL (ref 150–400)
Platelets: 284 K/uL (ref 150–400)
Platelets: 189 10*3/uL (ref 150–400)
RBC: 2.56 MIL/uL — ABNORMAL LOW (ref 4.22–5.81)
RBC: 2.69 MIL/uL — ABNORMAL LOW (ref 4.22–5.81)
RBC: 2.7 MIL/uL — ABNORMAL LOW (ref 4.22–5.81)
RBC: 2.73 MIL/uL — ABNORMAL LOW (ref 4.22–5.81)
RBC: 3.01 MIL/uL — ABNORMAL LOW (ref 4.22–5.81)
RDW: 13.4 % (ref 11.5–15.5)
RDW: 13.5 % (ref 11.5–15.5)
RDW: 13.7 % (ref 11.5–15.5)
RDW: 14.4 % (ref 11.5–15.5)
WBC: 4.7 10*3/uL (ref 4.0–10.5)
WBC: 5.5 K/uL (ref 4.0–10.5)
WBC: 6 10*3/uL (ref 4.0–10.5)
WBC: 7 10*3/uL (ref 4.0–10.5)
WBC: 7.6 10*3/uL (ref 4.0–10.5)
WBC: 9 K/uL (ref 4.0–10.5)
WBC: 7.3 10*3/uL (ref 4.0–10.5)

## 2010-08-19 LAB — BLOOD GAS, ARTERIAL
Acid-base deficit: 10.7 mmol/L — ABNORMAL HIGH (ref 0.0–2.0)
Bicarbonate: 13.5 mEq/L — ABNORMAL LOW (ref 20.0–24.0)
FIO2: 0.5 %
O2 Saturation: 95.2 %
pCO2 arterial: 23.7 mmHg — ABNORMAL LOW (ref 35.0–45.0)
pO2, Arterial: 75.3 mmHg — ABNORMAL LOW (ref 80.0–100.0)

## 2010-08-19 LAB — RENAL FUNCTION PANEL
Albumin: 2.2 g/dL — ABNORMAL LOW (ref 3.5–5.2)
BUN: 7 mg/dL (ref 6–23)
CO2: 14 meq/L — ABNORMAL LOW (ref 19–32)
Calcium: 8.6 mg/dL (ref 8.4–10.5)
Chloride: 120 meq/L — ABNORMAL HIGH (ref 96–112)
Creatinine, Ser: 1.08 mg/dL (ref 0.4–1.5)
GFR calc non Af Amer: >60 mL/min
Glucose, Bld: 107 mg/dL — ABNORMAL HIGH (ref 70–99)
Phosphorus: 3 mg/dL (ref 2.3–4.6)
Potassium: 2.8 meq/L — ABNORMAL LOW (ref 3.5–5.1)
Sodium: 144 meq/L (ref 135–145)

## 2010-08-19 LAB — DIFFERENTIAL
Basophils Relative: 0 % (ref 0–1)
Eosinophils Absolute: 0 10*3/uL (ref 0.0–0.7)
Eosinophils Relative: 0 % (ref 0–5)
Lymphs Abs: 0.8 10*3/uL (ref 0.7–4.0)
Monocytes Absolute: 0.4 10*3/uL (ref 0.1–1.0)
Monocytes Relative: 6 % (ref 3–12)

## 2010-08-19 LAB — CLOSTRIDIUM DIFFICILE EIA: C difficile Toxins A+B, EIA: NEGATIVE

## 2010-08-19 LAB — MAGNESIUM
Magnesium: 1.3 mg/dL — ABNORMAL LOW (ref 1.5–2.5)
Magnesium: 1.8 mg/dL (ref 1.5–2.5)
Magnesium: 2.5 mg/dL (ref 1.5–2.5)
Magnesium: 2.5 mg/dL (ref 1.5–2.5)

## 2010-08-19 LAB — POCT I-STAT 4, (NA,K, GLUC, HGB,HCT)
HCT: 35 % — ABNORMAL LOW (ref 39.0–52.0)
Hemoglobin: 11.9 g/dL — ABNORMAL LOW (ref 13.0–17.0)
Potassium: 4.7 mEq/L (ref 3.5–5.1)
Sodium: 139 mEq/L (ref 135–145)

## 2010-08-19 LAB — CARDIAC PANEL(CRET KIN+CKTOT+MB+TROPI)
CK, MB: 5.2 ng/mL — ABNORMAL HIGH (ref 0.3–4.0)
CK, MB: 7.2 ng/mL — ABNORMAL HIGH (ref 0.3–4.0)
Relative Index: 3.6 — ABNORMAL HIGH (ref 0.0–2.5)
Relative Index: 4.1 — ABNORMAL HIGH (ref 0.0–2.5)
Total CK: 201 U/L (ref 7–232)

## 2010-08-19 LAB — PTH, INTACT AND CALCIUM
Calcium, Total (PTH): 8.4 mg/dL (ref 8.4–10.5)
PTH: 35.3 pg/mL (ref 14.0–72.0)

## 2010-08-19 LAB — BRAIN NATRIURETIC PEPTIDE: Pro B Natriuretic peptide (BNP): 313 pg/mL — ABNORMAL HIGH (ref 0.0–100.0)

## 2010-08-19 LAB — T4: T4, Total: 6.4 ug/dL (ref 5.0–12.5)

## 2010-08-19 LAB — TYPE AND SCREEN
ABO/RH(D): A POS
Antibody Screen: NEGATIVE

## 2010-08-19 LAB — SODIUM, URINE, RANDOM: Sodium, Ur: 17 mEq/L

## 2010-08-19 LAB — GLUCOSE, CAPILLARY

## 2010-08-19 LAB — T4, FREE: Free T4: 1.15 ng/dL (ref 0.89–1.80)

## 2010-08-19 LAB — T3: T3, Total: 49.2 ng/dL — ABNORMAL LOW (ref 80.0–204.0)

## 2010-08-19 LAB — OSMOLALITY, URINE: Osmolality, Ur: 505 mosm/kg (ref 390–1090)

## 2010-08-19 LAB — NA AND K (SODIUM & POTASSIUM), RAND UR
Potassium Urine: 21 meq/L
Sodium, Ur: 109 meq/L

## 2010-08-19 LAB — TSH: TSH: 0.149 u[IU]/mL — ABNORMAL LOW (ref 0.350–4.500)

## 2010-08-19 MED ORDER — MELOXICAM 7.5 MG PO TABS: 7.5000 mg | ORAL_TABLET | Freq: Every day | ORAL | Status: DC

## 2010-08-19 NOTE — Telephone Encounter (Signed)
Pt's spouse advised per VAL's instructions and states that pt has ROV already scheduled with [facility]E.

## 2010-08-19 NOTE — Telephone Encounter (Signed)
Thanks - noted mild renal insuff and anemia - but will try low dose generic mobic daily for 1 mo - erx done, no refills - please plan ROV to discuss with [facility]E if further tx with this med appropriate (if effective)- thanks

## 2010-08-19 NOTE — Telephone Encounter (Signed)
Chart abstracted (under your name, I will find out how to change to SAE), pt has never been prescribed Mobic per EMR.

## 2010-08-23 ENCOUNTER — Other Ambulatory Visit: Payer: Self-pay | Admitting: Internal Medicine

## 2010-08-24 ENCOUNTER — Other Ambulatory Visit: Payer: Self-pay | Admitting: Endocrinology

## 2010-08-24 MED ORDER — LOSARTAN POTASSIUM-HCTZ 100-12.5 MG PO TABS: 1.0000 | ORAL_TABLET | Freq: Every day | ORAL | Status: DC

## 2010-08-24 NOTE — Telephone Encounter (Signed)
R'cd fax from [facility] for pt's Losartan-HCTZ refill.  Last filled-07/23/2010  Last OV-07/26/2010

## 2010-09-14 ENCOUNTER — Telehealth: Payer: Self-pay

## 2010-09-14 DIAGNOSIS — J4489 Other specified chronic obstructive pulmonary disease: Secondary | ICD-10-CM

## 2010-09-14 DIAGNOSIS — J449 Chronic obstructive pulmonary disease, unspecified: Secondary | ICD-10-CM

## 2010-09-14 NOTE — Telephone Encounter (Signed)
Pt called requesting referral to Pulmonary per last OV 07/26/2010

## 2010-09-14 NOTE — Telephone Encounter (Signed)
Pt's spouse advised and will expect a call from [facility] with appt info 

## 2010-09-14 NOTE — Telephone Encounter (Signed)
Done

## 2010-09-21 NOTE — Consult Note (Signed)
NAME:  Brandon [name], Brandon [name] NO.:  [id number]   MEDICAL RECORD NO.:  [id number]          PATIENT TYPE:  INP   LOCATION:  3102                         FACILITY:  [facility]   PHYSICIAN:  Pricilla Riffle, MD, FACCDATE OF BIRTH:  04-May-1926   DATE OF CONSULTATION:  DATE OF DISCHARGE:                                 CONSULTATION   PRIMARY CARDIOLOGIST:  Noralyn Pick. Eden Emms, MD, [facility]   PRIMARY CARE Irina Okelly:  Cleophas Dunker. Everardo All, MD   REQUESTING PHYSICIAN:  Oley Balm. Sung Amabile, MD   Ted McalpineDanae Orleans. Venetia Maxon, MD   PATIENT PROFILE:  An 77 year old married Caucasian male without prior  cardiac history with negative Myoview in 2008 who we were asked to  evaluate postoperatively secondary to AFib with RVR.   PROBLEM LIST:  1. Atrial fibrillation with rapid ventricular response.  2. History of chest pain      a.     October 14, 2005, negative Myoview, ejection fraction 62%.      b.     December 21, 2006, normal dobutamine echo.  3. Hypertension.  4. Hyperlipidemia.  5. Peripheral arterial disease.      a.     Chronic intermittent claudication.      b.     In October 2009, carotid ultrasound, right internal carotid       artery 60-79%, left internal carotid artery 40-59%.  6. Chronic kidney disease with baseline creatinine of 1.6.  7. Chronic obstructive pulmonary disease.  8. Remote tobacco abuse, 55-pack-year history, quitting in 2003.  9. History of reflux esophagitis and gastrointestinal bleed in May      2007.  10.Hiatal hernia.  11.Peptic ulcer disease and history of gastric ulcer.  12.Osteoarthritis/osteoporosis.  13.Lumbar disk disease, status post L3-4 decompression and diskectomy      as well as bilateral L4-L5 laminectomy and foraminotomies with      microdissection on July 22, 2008.  14.Postoperative ileus.  15.Postoperative aspiration pneumonia.   HISTORY OF PRESENT ILLNESS:  An 77 year old married Caucasian male with  the above problem list.  He recently saw Dr. Eden Emms  for preop surgical  evaluation and clearance and was felt be lower risk for MI.  He  underwent surgery on July 22, 2008, and postoperative course has since  been complicated by emesis and subsequent diagnosis of ileus on July 25, 2008.  He was also noted to have productive cough with hypoxia and  confusion and subsequently diagnosed with aspiration pneumonia and  placed on Avelox and now Zosyn.  He has also had acute on chronic renal  failure bumping his creatinine to as high as 2.5.  He has also had  progressive anemia and metabolic abnormalities including hypernatremia,  hyperchloremia, and hypokalemia.  Yesterday, he was found to be  progressively more hypoxic and confused and also in AFib/flutter with  rates as high as 140s.  He was asymptomatic with the exception of  dyspnea.  He was moved to the neuro ICU, and Pulmonology was consulted.  He continues to have atrial fibrillation with rates into the 140s,  although at this point  he appears to be asymptomatic, denying chest pain  or dyspnea.  He does have significant productive cough.  He has had 2  sets of cardiac markers and those are negative.   ALLERGIES:  No known drug allergies.   CURRENT MEDICATIONS:  1. Albuterol 2.5 mg q.6 hours.  2. Dulcolax 10 mg PR daily.  3. Heparin 5000 subcu q.8 hours.  4. Atrovent 0.5 mg q.6 hours.  5. Protonix 40 mg IV daily.  6. Zosyn 2.25 g IV q.6 hours.  7. Thiamine 100 mg daily.   FAMILY HISTORY:  Mother died at 52 of unknown causes.  Father died at 12  of CVA.  He also had a history of coronary disease.  Sister died at 61  in a drowning accident.   SOCIAL HISTORY:  He lives in [place] with his wife.  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  He denies alcohol or drugs.   REVIEW OF SYSTEMS:  Positive for dyspnea, cough, wheezing.  He denies  any low back pain.  Although he has abdominal distention, he denies any  pain.  He has had 3  bowel movements in the past 2 days.  He had emesis  with nausea earlier this admission, although denies this now.  He is a  full code.  Otherwise, all systems reviewed negative.   PHYSICAL EXAMINATION:  VITAL SIGNS:  Temperature 97.8, heart rate 133,  respirations 25, blood pressure 119/58, pulse ox 98% on 4 L.  Intake  2925, output 1553, and this is from yesterday.  GENERAL:  Pleasant white male in no acute distress, awake, alert, and  oriented x3.  He does have a productive cough, although talking.  HEENT:  Normal with the exception of poor dentition.  NEUROLOGIC:  Grossly intact, nonfocal.  SKIN:  Warm and dry without lesions or masses.  He has a dressing on the  lower back that is dry and intact.  PSYCHIATRIC:  Normal affect.  MUSCULOSKELETAL:  Grossly normal without deformity or effusions.  NECK:  No bruits or JVD.  LUNGS:  Respirations are regular and unlabored with diminished breath  sounds at the right base and bilateral rhonchi and expiratory wheezing  throughout.  CARDIAC:  Irregularly irregular.  Tachycardic.  S1 and S2.  No S3, S4,  or murmurs.  ABDOMEN:  Firm and distended, although nontender.  He has hypoactive  bowel sounds x4.  EXTREMITIES:  Warm, dry, pink.  No clubbing, cyanosis, or edema.  Dorsalis pedis, posterior tibial pulses 1+ and equal bilaterally.   Chest x-ray shows bibasilar opacities, atelectasis, or pneumonia.  EKG  shows AFib/flutter with a left axis deviation.  He has anterolateral  upsloping ST depression with a rate 137.  Hemoglobin 8.8, hematocrit  25.4, WBC 4.7, platelets 217.  Sodium 150, potassium 3.0, chloride 124,  CO2 16, BUN 51, creatinine 2.08, glucose 115.  BNP 313, CK 126, MB 5.2,  troponin I of 0.03.   ASSESSMENT AND PLAN:  1. Atrial fibrillation/flutter with rapid ventricular response, is      asymptomatic.  This is likely responsive to clinical      condition/metabolic abnormalities.  We will add diltiazem bolus      followed by  infusion.  We discussed this case with Dr. Venetia Maxon, and      we will avoid anticoagulation at this time in the setting of recent      surgery.  Check TSH, magnesium, and echo.  Correct  potassium which      is currently ongoing.  2. Aspiration pneumonia on antibiotics per Pulmonology.  3. Status post lumbar surgery.  He denies any pain.  Per Neurosurgery.  4. Anemia.  His H and H have been trending down.  Continue transfusion      for further decrease.  5. Ileus, resolving.  6. Metabolic abnormalities including hypernatremia and hypokalemia.      We will check a magnesium.  He is receiving potassium through his      IV at this time.  He has been taken off saline.      Nicolasa Ducking, ANP      Pricilla Riffle, MD, [facility]  Electronically Signed    CB/MEDQ  D:  07/28/2008  T:  07/29/2008  Job:  [id number]

## 2010-09-21 NOTE — Assessment & Plan Note (Signed)
[facility]                            CARDIOLOGY OFFICE NOTE   Brandon [name], Brandon [name]                      MRN:          [id number]  DATE:03/25/2008                            DOB:          [birth year]    Brandon [name] is a pleasant 77 year old gentleman who has a history of  peripheral vascular disease.  The previous dobutamine echocardiogram in  August 2008, was normal.  He also has a cerebrovascular disease.  He had  carotid Dopplers performed on February 21, 2008, that showed 60-79% right  and 40-59% left.  There was a left subclavian artery stenosis as well.  Since I last saw him he denies any dyspnea, chest pain, palpitations, or  syncope.  He does have pain in his buttocks and legs after walking  approximately 100 yards.  This is unchanged from previous.  Note, he  does not have this with routine activities.   MEDICATIONS:  1. Multivitamin daily.  2. Spiriva.  3. Aspirin 81 mg p.o. daily.  4. Benicar HCT 40/25 daily.  5. Crestor 30 mg p.o. daily  6. Norvasc 5 mg p.o. daily.  7. Vitamin D.  8. Prilosec.   PHYSICAL EXAMINATION:  VITAL SIGNS:  Blood pressure of 134/60 and his  pulse is 60.  HEENT:  Normal.  NECK:  Supple.  CHEST:  Clear.  CARDIOVASCULAR:  Regular rate and rhythm.  ABDOMEN:  No tenderness.  EXTREMITIES:  No edema.   His electrocardiogram shows a sinus rhythm at a rate of 58.  There is a  first-degree AV block.  A prior posterior infarct cannot be excluded.   DIAGNOSES:  1. Peripheral vascular disease - Brandon [name] continues to have      claudication, but it is unchanged from previous.  He can walk      approximately 100 yards.  He preferred to continue with medical      therapy.  We will continue his aspirin, angiotensin receptor      blocker, and statin.  I have also encouraged him to walk.  If it      worsens in the future, then he will refer to Dr. Excell Seltzer or to Dr.      Clifton James.  2. Cerebrovascular disease - he will  need follow up carotid Dopplers      in April.  We will continue on his aspirin and statin.  3. History of mild renal insufficiency - we will check a BMET next      time he has blood drawn.  4. History of gastrointestinal bleed.  5. Chronic obstructive pulmonary disease.  6. Hypertension - his blood pressure is adequately controlled.  7. Hyperlipidemia - we will continue on his statin.  8. History of osteoporosis.   I will see him back in 1 year.     Madolyn Frieze Jens Som, MD, [facility]  Electronically Signed    BSC/MedQ  DD: 03/25/2008  DT: 03/25/2008  Job #: [id number]

## 2010-09-21 NOTE — Assessment & Plan Note (Signed)
[facility]                            CARDIOLOGY OFFICE NOTE   LANCER, THURNER                      MRN:          [id number]  DATE:06/24/2008                            DOB:          14-May-1925    [name] is an 77 year old gentleman who has a history of peripheral  vascular disease.  He has cerebrovascular disease and his last carotid  Dopplers were performed on February 21, 2008, and there was 60-79% right  and 40-59% left.  There was also left subclavian artery stenosis.  Note,  he also has had previous stress test.  He had a Myoview performed on  October 28, 2005, that showed an ejection fraction of 62%.  There was  diaphragmatic attenuation without ischemia or infarction.  He also had a  dobutamine echo performed on December 21, 2006.  It was felt to be a  normal study.  Since I last saw him, he is planning on having back  surgery.  He apparently is going to have disk fusion.  We were asked to  evaluate preoperatively.  Note, he denies any dyspnea on exertion,  orthopnea, PND, pedal edema, palpitations, presyncope, syncope, or  exertional chest pain.  He continues to have claudication.  His legs  began to ache after approximately 50 hours of walking.  This is  unchanged since I saw him on March 25, 2008.  It is relieved promptly  with rest.  The pain does not radiate.   CURRENT MEDICATIONS:  1. Multivitamin daily.  2. Spiriva.  3. Aspirin 81 mg p.o. daily.  4. Benicar HCT 40/25 daily.  5. Crestor 30 mg p.o. daily.  6. Norvasc 5 mg p.o. daily.  7. Vitamin D.  8. Prilosec.   PHYSICAL EXAMINATION:  VITAL SIGNS:  Blood pressure of 150/78 and his  pulse is 68.  He weighs 157 pounds.  HEENT:  Normal.  NECK:  Supple.  CHEST:  Clear.  CARDIOVASCULAR:  Regular rate and rhythm.  ABDOMEN:  No tenderness.  EXTREMITIES:  No edema.   His electrocardiogram shows a sinus rhythm with PACs.  There is an  incomplete right bundle-branch block.   There are no ST changes noted.   DIAGNOSES:  1. Preoperative evaluation prior to back surgery - [name] has had      no chest pain or increased shortness of breath.  He did have a      dobutamine echocardiogram in August 2009 that was normal.  He also      had Myoview in June 2007 that showed no ischemia.  I do not think      we need to pursue further testing prior to his surgery.  We would      recommend continuing his present medications postoperatively.  2. Hypertension - the patient's blood pressure is elevated today.      However, he states that he recently decreased his Norvasc to 5 mg      p.o. daily from 10.  I will therefore resume 10 mg and we will      track this and adjust as indicated.  3. Cerebrovascular disease - he needs followup carotid Dopplers in      April 2010.  He will continue on his aspirin and statin.  4. History of mild renal insufficiency.  5. History of gastrointestinal bleed.  6. Chronic obstructive pulmonary disease.  7. Hyperlipidemia - he will continue on his statin.  8. History of osteoporosis.   I will see him back in approximately 6 months.  I will be happy to see  while he is in the hospital.     Brandon [name]. Brandon Som, MD, [facility]  Electronically Signed    BSC/MedQ  DD: 06/24/2008  DT: 06/24/2008  Job #: [id number]

## 2010-09-21 NOTE — Assessment & Plan Note (Signed)
[facility]                            CARDIOLOGY OFFICE NOTE   Brandon [name], Brandon [name]                      MRN:          [id number]  DATE:11/03/2006                            DOB:          [birth year]    HISTORY OF PRESENT ILLNESS:  [name] is a 77 year old gentleman with  longstanding atherosclerotic peripheral arterial disease in the setting  of hypertension and fairly recently diagnosed diabetes mellitus.  He has  longstanding lower extremity vascular disease, which dates back more  than a decade.  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  Most recent check showed ABIs of 0.72 on the right and 0.56  on the left.   He has previously had a large GI bleed.  However, he says his stools  have been brown of late.  He has not had any abdominal discomfort.  He  has had no chest discomfort, palpitations, syncope, or presyncope.  However, he has been troubled by moderate exertional dyspnea with  activity such as working in his garden.   CURRENT MEDICATIONS:  1. Multivitamin.  2. Spiriva.  3. Aspirin 81 mg daily.  4. Prilosec 40 mg daily.  5. Norvasc 5 mg daily.  6. Micardis 80/25 one daily.  7. Zocor 80 mg daily.   His Norvasc was recently increased to 10 mg, but he has not yet gone to  this dose.   PHYSICAL EXAMINATION:  He is generally well appearing, in no distress.  Heart rate 64.  Blood pressure 138/62.  Weight 165 pounds.  He has no jugular venous distention, thyromegaly, or lymphadenopathy.  He has a nondisplaced point of maximal cardiac impulse.  There is a  regular rate and rhythm without murmur, rub, or gallop.  LUNGS:  Clear to auscultation.  Respiratory effort is normal.  ABDOMEN:  Soft.  Non-distended.  Non-tender.  There is no  hepatosplenomegaly.  Bowel sounds are normal.  EXTREMITIES:  Warm without clubbing, cyanosis, edema, or  ulceration.  Carotid pulses are 2+ bilaterally without bruit.  Femoral pulses are not  palpable on either side.  DP and PT are not palpable on either side.   IMPRESSION/RECOMMENDATIONS:  1. Exertional dyspnea:  Given his severe peripheral disease, this      certainly might represent an anginal equivalent.  He had a normal      EKG Cardiolite in June 2007.  We will check a stress echo to allow      assessment of both ejection fraction and allow an alternative      modality for ruling out his coronary disease.  Given his severe      peripheral disease, I think it is important that we not miss      balanced ischemia that we might on a Cardiolite.  2. Prior gastrointestinal bleed:  Check CBC given his dyspnea.  3. Lower extremity vascular disease:  Minimally symptomatic.  Continue      with exercise therapy.  4. Renal insufficiency:  Check BMET.  5. Chronic obstructive pulmonary disease:  This is probably the  cause      of his dyspnea.  6. Osteoporosis.  7. Psoriasis.  8. Disposition:  Patient will follow up with Dr. Jens Som in      approximately 4 weeks' time.     Salvadore Farber, MD  Electronically Signed    WED/MedQ  DD: 11/03/2006  DT: 11/03/2006  Job #: [id number]

## 2010-09-21 NOTE — Assessment & Plan Note (Signed)
[facility]                                 ON-CALL NOTE   EVER, GUSTAFSON                      MRN:          [id number]  DATE:08/12/2007                            DOB:          1925-05-25    August 12, 2007 at 2:55 p.m. Phone [phone].   REGULAR DOCTORS:  Dr. Everardo All and Dr. Milinda Antis.   ON-CALL CHIEF COMPLAINT:  Low blood pressure. The patient says that he  is on 2 blood pressure medications, Benicar 40 and Norvasc 10, both of  which he takes in the mornings. He has been taking them regularly and  usually he does not have a blood pressure of systolic number below 140  or 962. Right now he says his blood pressure is down to 100 systolic and  80s diastolic. He says he feels a little weak and this is very unusual  for him. Otherwise, he feels okay.  He denies feeling ill, nausea,  vomiting, fever, chest pain, shortness of breath, or any other symptoms.  He says he did have a very mild case of diarrhea at the end of last week  but that went away. I told him to watch his blood pressure closely  today. If his blood pressure goes down further or he develops any  dizziness or increased weakness to go to the emergency room. I told him  if he develops chest pain, shortness of breath, or any other symptoms  also to go to the emergency room. Otherwise, he will hold his medicine  tomorrow morning, check his blood pressure and call Dr. Garyson Hugh office  for followup.     Marne A. Tower, MD  Electronically Signed    MAT/MedQ  DD: 08/12/2007  DT: 08/12/2007  Job #: [id number]

## 2010-09-21 NOTE — Op Note (Signed)
NAME:  Brandon [name], Brandon [name] NO.:  [id number]   MEDICAL RECORD NO.:  [id number]          PATIENT TYPE:  INP   LOCATION:  2899                         FACILITY:  [facility]   PHYSICIAN:  Danae Orleans. Venetia Maxon, M.D.  DATE OF BIRTH:  Jun 21, 1925   DATE OF PROCEDURE:  07/22/2008  DATE OF DISCHARGE:                               OPERATIVE REPORT   PREOPERATIVE DIAGNOSES:  Scoliosis, spondylosis, stenosis, and lumbar  radiculopathy L3-L4 and L4-5 levels.   POSTOPERATIVE DIAGNOSES:  Scoliosis, spondylosis, stenosis, and lumbar  radiculopathy L3-L4 and L4-5 levels.   PROCEDURES:  1. Anterolateral L3-4 decompression with diskectomy with PEEK cage      with Osteocel and lateral plating from L3-L4 level using neuro      monitoring.  2. Bilateral L4-5 laminar foraminotomies with microdissection.   SURGEON:  Danae Orleans. Venetia Maxon, MD   ASSISTANT:  1. Hewitt Shorts, MD  2. Georgiann Cocker, RN   ANESTHESIA:  General endotracheal anesthesia.   ESTIMATED BLOOD LOSS:  Minimal.   COMPLICATIONS:  None.   DISPOSITION:  Recovery.   INDICATIONS:  Brandon [name], an 77 year old man with severe lumbar  scoliosis and spondylosis with marked spinal stenosis particularly with  severe right L3 nerve root compression due to scoliotic deformity in the  lateral portion of the interspace and with bilateral L4-5 foraminal and  lateral recess stenosis.   PROCEDURE:  It was elected to take him to the surgery for decompression  and fusion with posterior laminar foraminotomies at the L4-5 level with  fusion at L3-4 and laminar foraminotomies at L4-5 level.   PROCEDURE:  Brandon [name] was brought to the operating room.  Following  satisfactory and uncomplicated induction of general endotracheal  anesthesia, and placement of intravenous lines, the patient was placed  on lateral position with left side elevated and using C-arm fluoroscopy  in AP and lateral projections, the L3-4 interspace was identified.  The  axillary roll was placed in the soft tissue and bony prominences were  padded appropriately.  The patient was taped into position with a tape  across his hip and also across his chest, so that the L3-4 level was  centered both on AP and lateral projections.  Subsequently, area of  planned incision was marked based on lateral fluoroscopy and the  posterior incision was also marked.  The skin was then prepped and  draped in the usual sterile fashion.  The initial incision was made  along the posterior aspect of the L3-4 incision one fingerbreadth  posteriorly and using blunt dissection, the retroperitoneal space was  identified.  The transverse process was identified and the psoas was  identified.  Then using the finger after making incision overlying the  L3-4 interspace, the incisions were connected and then using the base of  minimal access retractor system, blunt dissector was placed overlying  the psoas and with neuro monitoring, appropriate twitch was encountered.  The sequential dilators were then used, 90-mm distractor blades were  placed and the table mount distractor was hooked to the distractor.  After confirming correct positioning on AP and lateral fluoroscopy with  a K-wire within the interspace, the neuro monitoring was performed to  confirm there was no evidence of neural stimulation along the posterior  aspect of the interspace and then following clear neuro monitoring, the  ischium was placed laterally, base of osteophyte was then removed after  the distractor was opened and its position was confirmed.  Thorough  diskectomy was then performed and using Cobb curettes, the disk space  was cleared of disk material and the lateral annulus on the right side  was cut using the Cobb to open the interspace.  Sequential dilators were  then placed and the thorough diskectomy was performed using a variety of  curettes and pituitary rongeurs and Kerrison rongeurs.  After trial  sizing  with 10 mm trial, it appeared that the interspace was well opened  up and scoliotic deformity was significantly reduced.  A 55 mm x 10 mm  PEEK cage was then selected, packed with 5 mL of Osteocel, inserted in  the interspace and countersunk appropriately.  Final radiographs  demonstrated well-positioned interbody graft at the L3-4 interspace in  the correct orientation.  A 10-mm lateral plate was then placed using  initial guide and with 55-mm screws, one at L3, one at L4, the screws  were bicortical and engaged appropriately with good trajectories.  A 10-  mm plate was then affixed to the heads of the screws and locked down  with the torque wrench and nuts.  The self-retaining tractor was then  removed.  The incisions were closed with interrupted Vicryl sutures and  dressed with Dermabond.  The patient was then turned to a prone position  on Wilson frame.  His low back was then prepped and draped in the usual  sterile fashion and an incision was made overlying L4-5 interspace,  carried to the lumbodorsal fascia, which was incised bilaterally  sharply.  Subperiosteal dissection was performed.  Initial radiograph  demonstrated marker at L3-4 and dissection was carried to the L4-5  level.  Bilateral hemilaminotomies of L4 were then performed with high-  speed drill and the bone as well as ligamentum flavum were detached and  removed in piecemeal fashion bilaterally.  The microscope was brought  into field and using microdissection technique, the lateral recesses  were also decompressed and using [facility], it was felt that the  neural elements were decompressed and that there did not appear to be  residual lateral recess or ligamentous lateral recess or bony stenosis.  Dr. Newell Coral confirmed that the decompression was adequate.  He also  removed bone and ligament from within the spinal canal under microscopic  visualization.  The wound was then irrigated.  Soft tissues were   inspected and found to be in good repair.  The self-retaining retractor  was removed.  The lumbodorsal fascia was closed with 0 Vicryl sutures,  subcutaneous tissues were approximated with 2-0 Vicryl interrupted  inverted sutures.  Skin edges were approximated with interrupted 3-0  Vicryl subcuticular stitch.  The wound was dressed with Dermabond.  The  patient was extubated in the operating room, taken to the recovery in  stable satisfactory condition having tolerated the operation well.  Counts were correct at the end of the case.      Danae Orleans. Venetia Maxon, M.D.  Electronically Signed     JDS/MEDQ  D:  07/22/2008  T:  07/23/2008  Job:  [id number]

## 2010-09-21 NOTE — Assessment & Plan Note (Signed)
[facility]                         GASTROENTEROLOGY OFFICE NOTE   ROYCE, STEGMAN                      MRN:          [id number]  DATE:02/05/2007                            DOB:          [birth year]    CHIEF COMPLAINT:  Followup on reflux, needs refill.   Mr. Jeffries has a history of:  1. Severe reflux esophagitis.  2. Hiatal hernia.  3. Gastric ulcer.  4. Osteoarthritis, he has been taking Darvocet intermittently as      prescribed by an orthopedist after we stopped his      antiinflammatories in the past.   He is having no problems with these issues at this time other than he  needs some Darvocet refilled and his Prilosec refilled because  restriction of NSAIDS. No dysphagia. No weight loss. He is having no  chest pain or cardiac issues though he does have severe pan-vascular  disease. His medications are listed and reviewed in the chart and do  include an aspirin a day. He has not been placed back on Fosamax though  that is not contraindicated at this point in my mind given his PPI  therapy. He does quite well on b.i.d. Prilosec 20 mg. He takes it before  breakfast and supper.   Note, he had been issued a recall by his original colonoscopist, Dr.  Arlyce Dice for a family history of colon cancer, he relates to me that upon  further review, his mother did not have any colon cancer. His last  colonoscopy in 2003 showed no polyps, it did show diverticulosis.   VITAL SIGNS: Show height 5 feet 11 inches, weight 164 pounds, pulse 64,  blood pressure 142/60.   ASSESSMENT:  As per problems listed above, there are 4 listed there.   1. He is doing well overall. We will continue his Prilosec 20 mg      b.i.d. We will prescribe Darvocet #60 to be used intermittently for      joint pain with a few refills. He can get further refills through      primary care or me in follow up if needed.  2. Regarding colon cancer screening. We now clarify that he does  not      have a family history of colon cancer, he had a colonoscopy 5 years      ago, even if he did have this family history in an elderly relative      given his comorbidities, one could make the argument for holding      off on further screening. He does understand the risk of missing a      colon cancer, but he declines to have a colonoscopy even though we      discussed it.   I would consider restarting Fosamax or something similar if desired, I  do not think it is contraindicated at this point but close follow up  will be needed.   I will see him back as needed.     Iva Boop, MD,FACG  Electronically Signed    CEG/MedQ  DD: 02/05/2007  DT: 02/05/2007  Job #:  [id number]   cc:   Gregary Signs A. Everardo All, MD

## 2010-09-21 NOTE — Discharge Summary (Signed)
NAME:  Brandon [name], Brandon [name] NO.:  [id number]   MEDICAL RECORD NO.:  [id number]          PATIENT TYPE:  INP   LOCATION:  3039                         FACILITY:  [facility]   PHYSICIAN:  Raenette Rover. Felicity Coyer, MDDATE OF BIRTH:  09-06-1925   DATE OF ADMISSION:  07/22/2008  DATE OF DISCHARGE:                               DISCHARGE SUMMARY   DISCHARGE DIAGNOSES:  1. Status post back surgery with L3-4 fusion and decompression of L4-5      on July 22, 2008, now for skilled nursing facility following      prolonged hospitalization, outpatient followup with Dr. Venetia Maxon at      [facility]  2. Aspiration pneumonia, status post 10-day antibiotic treatment.  3. Ileus with nausea, vomiting, now resolving diarrhea.  No evidence      of infection, upper gastrointestinal symptoms improved.  See      details below.  4. Postoperative atrial fibrillation with rapid ventricular response      in the setting of acute hypoxic insufficiency due to pneumonia, now      normal sinus.  Continue amiodarone x6 weeks, not a candidate for      anticoagulation.  5. Hypokalemia, exacerbated by gastrointestinal loss, replacement      ongoing, see details below.  6. Hypomagnesium related to gastrointestinal loss, status post      replacement normalized, discharge magnesium 18.  7. Acute renal insufficiency during this hospitalization, status post      renal evaluation with med adjustment.  Discharge creatinine of 1.2,      note previous baseline 1.6.  8. Mild metabolic acidosis likely related to acute tubular necrosis      with ongoing gastrointestinal loss, on bicarb at this time, pending      further followup.  9. Confusion with altered mental status related to sundowning during      acute illness, now resolved and at baseline.  10.Hypertension, med adjustment due to atrial fibrillation during this      hospitalization, see details below.  11.History of chronic obstructive pulmonary disease, no  acute      exacerbation.  Continue p.r.n. nebs and outpatient followup.  12.Anemia, multifactorial related to chronic disease, discharge      hemoglobin 9.0.  13.History of chronic kidney disease, stage III, see details above,      baseline creatinine 1.6.  14.Dyslipidemia.  Continue home Crestor.  15.Diarrhea, see problem above regarding ileus, Clostridium difficile      negative x3.  Normal TSH, nontoxic, suitable for Imodium p.r.n.   DISCHARGE MEDICATIONS:  1. Toprol-XL 50 mg once daily.  2. Amiodarone 200 mg once daily through Friday, Sep 12, 2008, then      stop, per direction of Cardiology.  3. Sodium bicarb 650 mg p.o. b.i.d. while acidotic.  4. Xopenex 0.63 mg nebulizer q.4 h. p.r.n.  5. Benicar 40 mg once daily (note previously on Benicar HCTZ 40/25      prior to admission, now off HCTZ component).  6. Crestor 30 mg nightly.  7. Omeprazole 20 mg p.o. b.i.d.  8. Aspirin 81 mg daily.  9.  Multivitamin once daily.  10.Vitamin D3 1000 units daily.  11.Spiriva 1 inhalation daily as needed.  12.Darvocet 100 mg p.o. q.4 h. p.r.n. pain.  13.Note, the patient is no longer taking his Norvasc 10 mg daily as      prior to admission.  14.The patient will also be taking Imodium 2 mg p.o. b.i.d., to hold      if no bowel movement in greater than 48 hours.  15.He is also prescribed Gas-X 80 mg p.o. t.i.d. p.r.n. gas and      distention.   DISPOSITION:  The patient is to be discharged to [facility], pending reevaluation of electrolytes and clinical  status on the morning of August 08, 2008.  At the time of this dictation,  he is hemodynamically stable, asymptomatic except for ongoing loose  stool, which has improved in the last 24 hours with med adjustment,  anticipated stable for discharge on August 08, 2008.   HOSPITAL COURSE:  1. Back surgery with anterolateral L3-4 fusion and decompression L4-5.      The patient is a pleasant 77 year old gentleman with chronic  stable      medical issues prior to admission, who was admitted by Dr. Venetia Maxon to      undergo a back surgery as described in the postop setting.  He had      multiple complications related to an ileus, nausea, vomiting,      aspiration pneumonia, and rapid AFib, also exacerbated by acute      renal insufficiency and progressive debilitation.  At this time,      there are no further surgical issues and he is felt stable for      discharge to Rehab with outpatient followup at Dr. Venetia Maxon with      mobilization out of bed and continue back precautions.  2. Aspiration pneumonia.  Again, several days postop, the patient      developed an ileus with abdominal distention and nausea and      vomiting, he appears to aspirate as he developed an acute      infiltrate in the left lower lobe on chest x-ray associated with      hypoxia and confusion.  He was begun on antibiotics, but due to      progressive respiratory distress, transferred to the ICU for      further evaluation and management by Pulmonary Critical Care      Service.  His antibiotics were broadened and he was treated for 10      days without further infectious complications with aggressive      pulmonary toilet, nebs, and O2, which was returned to his baseline      and is currently 98% on room air.  Chest x-ray done on the day of      dictation showed marked improvement in his airspace disease with      improved aeration and he is felt stable from a pulmonary standpoint      to continue p.r.n. treatment of his underlying COPD as prior to      admission.  3. Postop AFib with RVR.  During the complicating features of his      aspiration pneumonia, the patient did develop AFib with RVR while      in the ICU.  He was seen in consultation by Cardiology.  His      cardiac enzymes were negative for acute events, and his 2-D echo      showed  generally normal LV ejection fraction.  He was begun on      diltiazem drip, transition to p.o. and then  converted to beta-      blocker, also with addition of amiodarone and eventual conversion      to normal sinus.  He had been treated temporarily with digoxin as      well, but this is no longer needed at this time.  Cardiology has      recommended a 6-week total treatment of amiodarone, which will be      completed after his dose on Friday, Sep 12, 2008, and he is not felt      to be an anticoagulation candidate nor need for long-term      anticoagulation in the setting of postop AFib.  He is safe to      continue his aspirin therapy as he is more than 2 weeks out from      his back surgery and these orders have been completed as noted      above.  Outpatient followup with [facility], Dr. Johney Frame in      6 weeks.  Appointment to be scheduled by family following      discharge.  4. Acute renal insufficiency postop.  The patient has a baseline      creatinine of approximately 1.6, which was exacerbated into the 2      range during his acute illness.  He was seen in consultation by      Renal because of associated metabolic acidosis, but this was also      felt to be in part related to his ongoing diarrhea with GI losses.      He was placed on bicarb with medication adjustment.  With careful      support, his creatinine has improved to better than baseline at 1.2      at the time of this dictation.  He is felt to resume his home      Benicar without the HCTZ component to avoid further exacerbation of      his electrolyte complications with continued outpatient followup      with his primary MD as needed.  He will be continued on bicarb      until the time of which his diarrhea has resolved with eventual      decrease titration and hopeful elimination of this medication in      the near future.  5. Hypokalemia.  This has been complicated by diarrhea with GI losses      and renal issues as described.  He has been aggressively replaced      with his potassium with improvement during this  hospitalization.      He was at one time felt also to be hypomagnesemic, but after      replacement, this level has normalized to 1.8 on the day of      discharge, mag oxide has been discontinued to avoid further      exacerbation of his diarrhea, which seems to have improved and      further electrolyte needs will be addressed on an ongoing basis by      the skilled nursing facility providers as well as his primary MD      following discharge.  6. Diarrhea.  In the postop days of his ileus with nausea and vomiting      decreased, the patient did develop diarrhea.  He has been tested  for C. diff, which has been negative x3 and he is nontoxic and      afebrile.  It is felt that this is related to his resolving ileus.      There are no other complicating features and he has been placed on      scheduled Imodium with p.r.n. Gas-X as described above.  If there      is improvement in his diarrhea symptoms and stabilization of his      electrolytes, he is felt stable for discharge to [facility].  Continue his rehab efforts at this time with      outpatient followup with his primary MD, would avoid medications      such as Protonix or mag oxide that may exacerbate diarrhea.  7. Other medical issues.  The patient's other chronic medical issues      are as listed above and had been generally stable.  His      antihypertensive medications have been altered due to the      occurrence of AFib during this hospitalization and then      hypokalemia, but he is felt to be stable and well enough for      discharge to skilled nursing facility on August 08, 2008,      anticipated.  Addendum to follow if there are further complications      or issues to arise prior to discharge.   Greater than 38 minutes spent on day of discharge.  Dictation, planning,  coordination, and chart review, and family education.  Please see chart  for further details.      Valerie A.  Felicity Coyer, MD  Electronically Signed     VAL/MEDQ  D:  08/07/2008  T:  08/07/2008  Job:  [id number]

## 2010-09-21 NOTE — Assessment & Plan Note (Signed)
[facility]                            CARDIOLOGY OFFICE NOTE   [name], [name]                      MRN:          [id number]  DATE:12/27/2006                            DOB:          Feb 20, 1926    [name] is a pleasant 77 year old gentleman who has previously been  followed by Dr. Samule Ohm for peripheral vascular disease.  He recently was  complaining of dyspnea.  Note he did have previous Myoviews, most  recently in June 2007.  This was normal with an ejection fraction of 62%  (there was diaphragmatic attenuation).  When Dr. Samule Ohm saw the patient  he scheduled him to have a dobutamine echocardiogram.  This was normal  and performed on December 21, 2006.  Note the patient also has a history  of peripheral vascular disease.  His most recent ABIs were performed on  September 05, 2005.  He has severe aortoiliac arterial occlusive disease  bilaterally.  There was significant plaque throughout the lower  extremities without significant infrainguinal segmental stenosis.  The  ABIs were 0.72 on the right and 0.56 on the left.  This has been treated  medically.  He also has cerebrovascular disease with the most recent  carotid Dopplers being performed on August 10, 2006, and he had a 60-70%  right and a 40-59% left internal carotid artery.  Since he was last seen  he does have some dyspnea on exertion which is unchanged.  There is no  orthopnea, PND or pedal edema.  He has not had chest pain.  Note he does  have claudication at times.  He states that at times he can walk  significant ways and other times he can only walk 100 yards.  However,  this is unchanged for the past 5-10 years.  Also note he does not have  left upper extremity pain.   His medications include:  1. Multivitamin one p.o. daily.  2. Spiriva one p.o. daily.  3. Aspirin 81 mg p.o. daily.  4. Prilosec.  5. Micardis 80/25 daily.  6. Zocor 80 mg p.o. daily.  7. Norvasc 10 mg p.o. daily.  8. Vitamin D.   PHYSICAL EXAMINATION TODAY:  Shows a blood pressure of 144/70, his pulse  is 58.  He weighs 162 pounds.  HEENT:  Normal.  NECK:  Supple with a normal upstroke bilaterally.  There are bilateral  carotid bruits.  CHEST:  Clear.  CARDIOVASCULAR EXAM:  Reveals a regular rate and rhythm.  His heart  sounds are distant.  ABDOMINAL EXAM:  Shows no tenderness to palpation.  His femoral pulses  are not palpable.  EXTREMITIES:  Show no edema and his distal pulses are not palpable.   DIAGNOSES:  1. Exertional dyspnea - he feels this is most likely related to mild      COPD.  Note his dobutamine echocardiogram was unremarkable.  We      will continue with medical therapy.  2. Peripheral vascular disease - he does have claudication but this      appears to be stable.  We will continue with medical therapy and  he      will continue exercise, which we stressed today.  He may need      interventions in the future.  3. Cerebrovascular disease - he will need followup carotid Dopplers in      April 2009.  4. History of gastrointestinal bleed.  5. History of mild renal insufficiency.  6. Chronic obstructive pulmonary disease.  7. Hypertension - his blood pressure is well controlled on his present      medications.  8. Hyperlipidemia - we will check lipids and liver today to follow his      lipids and we will adjust as indicated with a goal LDL of less than      70.  We will also check a BMET today given his history of mild      renal insufficiency.  9. History of osteoporosis.   We will see him back in 6 months.     Madolyn Frieze Jens Som, MD, [facility]  Electronically Signed    BSC/MedQ  DD: 12/27/2006  DT: 12/28/2006  Job #: [id number]

## 2010-09-21 NOTE — Assessment & Plan Note (Signed)
[facility]                            CARDIOLOGY OFFICE NOTE   NIVEK, POWLEY                      MRN:          [id number]  DATE:06/12/2007                            DOB:          09/13/1925    Mr. Malkin is a very pleasant 77 year old gentleman who has a history of  peripheral vascular disease.  Note, his most recent stress test was a  dobutamine echocardiogram performed on December 21, 2006.  This was normal  with an normal study.  He also has a history of peripheral vascular  disease with ABIs of 0.72 on the right and 0.56 on the left, it has been  managed medically.  He also has cerebrovascular disease.  Since I last  saw him, he denies any increased dyspnea on exertion, orthopnea, PND,  pedal edema, palpitation, presyncope, syncope or chest pain.  He  develops claudication after walking approximately 100 yards, which is  unchanged and does not appear to be significantly limiting.   MEDICATIONS:  1. Multivitamin daily.  2. Spiriva one p.o. daily.  3. Aspirin 81 mg p.o. daily.  4. Micardis 80/25 daily.  5. Zocor 80 mg p.o. daily.  6. Norvasc 10 mg p.o. daily.  7. Vitamin D.  8. Prilosec 20 mg p.o. b.i.d.   PHYSICAL EXAMINATION:  VITAL SIGNS:  Blood pressure 130/60.  His pulse  of 75.  Weight is 164 pounds.  HEENT:  Normal.  NECK:  Supple.  CHEST:  Clear.  CARDIOVASCULAR:  Regular rate and rhythm.  ABDOMEN:  No tenderness.  EXTREMITIES:  No edema.   His electrocardiogram shows sinus rhythm at a rate of 78.  There are no  ST changes noted.   DIAGNOSES:  1. Peripheral vascular disease - Mr. Lupercio does have some      claudication but it appears to be stable.  We will continue with      medical therapy including his aspirin, ARB and Statin.  We have      emphasized that he needs to continue to exercise and he is planning      on enrolling in an exercise program.  2. Cerebrovascular disease - he needs follow-up carotid Dopplers  in      April and we will arrange that.  3. History of mild renal insufficiency - we will check a BMET.  4. History of gastrointestinal bleed.  5. Chronic obstructive pulmonary disease.  6. Hypertension - his blood pressure is adequately controlled.  7. Hyperlipidemia - we will continue his Statin and we will check      lipids and liver and adjust as indicated.  8. History of osteoporosis.   He will see [place] back in six months.     Madolyn Frieze Jens Som, MD, [facility]  Electronically Signed    BSC/MedQ  DD: 06/12/2007  DT: 06/13/2007  Job #: [id number]

## 2010-09-21 NOTE — Assessment & Plan Note (Signed)
[facility]                               LIPID CLINIC NOTE   Brandon, [name]                      MRN:          [id number]  DATE:11/22/2007                            DOB:          08/13/1925    The patient was seen in Lipid Clinic for further evaluation and  medication titration associated with hyperlipidemia in the setting of  documented coronary disease.  The patient has been feeling and doing  well overall.  He is almost out of his 20 mg Crestor daily.  He did  discontinue his fish oil because the gas which it formed was too large  in late May.  He stayed off his soft drinks for approximately 6 months  though these have crept back into his diet.  He continues to follow a  relatively low-fat, low-cholesterol diet, with a low-salt intake.  He  has monitored his portion size and has had improvement overall in his  diet.  He does continue to include snacks of peanut butter crackers and  V8 juice and he does state that he has replaced his whole wheat bread  which he substituted back approximately 6 months ago with [place] bread  and now low-fat milk.  The patient has exercise history including  walking several times each week for approximately 30 minutes.  Mr.  [name] has past medical history pertinent for hypertension, peripheral  arterial disease, and diabetes.   CURRENT MEDICATIONS:  1. Multivitamin daily.  2. Spiriva 1 inhalation daily.  3. Aspirin 81 mg daily.  4. Norvasc 10 mg daily.  5. Vitamin D 1000 international units daily.  6. Prilosec 20 mg twice daily.  7. Crestor 20 mg daily.  8. Benicar HCT 40/25 mg 1 tablet daily.  9. Norvasc 5 mg daily.   The patient takes Darvocet only as needed.   REVIEW OF SYSTEMS:  As stated in HPI, otherwise, negative.   PHYSICAL EXAMINATION:  Weight today is 157-1/4 pounds down from 160  pounds at our last visit.  Blood pressure is 110/54, heart rate is 60.   Labs on November 15, 2007, revealed a  total cholesterol of 212, triglycerides  248, HDL 67.4, LDL 99.2.  LFTs are within normal limits.   ASSESSMENT:  The patient has had significant loss of control in his  labs.  This is somewhat concerning and we have arranged the following  plan.  We will follow up accordingly.   PLAN:  1. Medication therapy.  The patient will reintroduce fish oil.  He      will open up the capsule and keep in the refrigerator and let me      know about tolerability of this plan.  2. Diet therapy.  The patient will switch back to whole wheat bread.      He will replace peanut butter with a serving of walnuts and almonds      as a snack.  He will be careful with his calorie count, and if he      finds that this is intake of too much calorie, we will reduce in  another area.  3. He will increase back towards his exercise each day as he has a new      puppy, he will increase his walking activity.  He will call with      questions or problems in the meantime.  We will follow up with him      on January 24, 2008, at 2:30 in the evening.  He will have lipid,      liver, as well as a glucose obtained on that day to determine if      his blood sugar control is part of our problem in maintaining      appropriate triglyceride values.  The patient will call with      questions or problems in the meantime.  Samples have been given.      Brandon [name], PharmD, BCPS, CPP  Electronically Signed      Noralyn Pick. Eden Emms, MD, [facility]  Electronically Signed   MP/MedQ  DD: 11/22/2007  DT: 11/23/2007  Job #: [id number]   cc:   Rollene Rotunda, MD, [facility]

## 2010-09-21 NOTE — Assessment & Plan Note (Signed)
[facility]                               LIPID CLINIC NOTE   DEMETRIES, COIA                      MRN:          [id number]  DATE:08/20/2007                            DOB:          [birth year]    [name] is a pleasant gentleman seen back in Lipid Clinic for further  evaluation, medication titration associated with his hyperlipidemia in  the setting of documented coronary disease.  The patient states since  his last visit he has been extremely motivated, he discontinued his use  of sodas and is eating much a more balanced and cholesterol and fat-free  diet.  He has been taking Crestor 20 mg daily, he has been tolerating  this without issue.  He has avoided tobacco and alcohol.  He states that  he has lost 6 pounds and he is exercising regularly 3-4 times each week  for 30 minutes or more.   PAST MEDICAL HISTORY:  Pertinent for documented coronary disease.   He has meds that include:  1. Multivitamin daily.  2. Spiriva daily.  3. Aspirin 81 mg daily.  4. Micardis has been discontinued.  5. Norvasc 10 mg daily.  6. Vitamin D daily.  7. Prilosec 20 mg twice daily.  8. Crestor 20 mg daily.  9. Benicar HCTZ 40/25, one tablet daily.   PHYSICAL EXAM:  Weight today is 158 pounds, blood pressure is 155/70,  heart rate is 64.   LABS:  On August 13, 2007, reveal total cholesterol 169, triglyceride 181,  HDL 71.8, LDL 61, LFTs are within normal limits.   ASSESSMENT:  The patient is feeling well and doing well overall, he is  in much better spirits and feels better overall.  He will continue with  his current regimen and work to continue his physical activity.  He will  continue on the same diet and will follow up with questions or problems  in the meantime.  Will see him back in 3 months with laboratories prior  to that visit.  Lyna Poser, PharmD Resident, participated in the  visit.      Shelby Dubin, PharmD, BCPS, CPP  Electronically  Signed      Rollene Rotunda, MD, [facility]  Electronically Signed   MP/MedQ  DD: 08/23/2007  DT: 08/23/2007  Job #: [id number]

## 2010-09-21 NOTE — Assessment & Plan Note (Signed)
[facility]                               LIPID CLINIC NOTE   JAHFARI, AMBERS                      MRN:          [id number]  DATE:06/25/2007                            DOB:          1925/06/30    Mr. Stormont is well know to me in the lipid clinic as his wife is a  patient in the anticoagulation clinic.  Mr. Samek has been feeling and  doing well overall.  He has had labs obtained most recently on June 14, 2007 that have revealed normal liver function tests, a borderline  blood glucose of 102, total cholesterol 215, triglycerides 410, HDL  75.6, and LDL cholesterol of 86.1.  The patient has been exercising and  is working to maintain a low-fat, low-cholesterol diet he and Ms. Bezek  have been following most recently.   PAST MEDICAL HISTORY:  is pertinent for coronary disease.   CURRENT MEDICATIONS:  Include:  1. Multivitamin daily.  2. Spiriva daily.  3. Aspirin 81 mg daily.  4. Micardis 80/25 one tablet daily.  5. Zocor 80 mg daily.  6. Norvasc 10 mg daily.  7. Vitamin D daily.  8. Prilosec 20 mg twice daily.   REVIEW OF SYSTEMS:  As stated in HPI and otherwise negative.   PHYSICAL EXAMINATION:  Weight today in the office is 160 pounds.  Blood  pressure is 120/74, heart rate is 72.   ALLERGIES:  None.   LABORATORY DATA:  As noted above.   ASSESSMENT:  The patient has need for further lipid and triglyceride  lowering.  At this point, I am inclined to change his Zocor 80 to  Crestor 20 mg daily.  I have given him samples.  If this works, then we  will expect to see further triglyceride lowering in one dose instead of  adding  further medications.  The patient will in the meantime work to have  tighten up his diet and exercise activity.  He will follow up with me in  6 weeks.      Shelby Dubin, PharmD, BCPS, CPP  Electronically Signed      Rollene Rotunda, MD, [facility]  Electronically Signed   MP/MedQ  DD: 08/07/2007  DT:  08/08/2007  Job #: [id number]   cc:   Madolyn Frieze. Jens Som, MD, [facility]

## 2010-09-22 ENCOUNTER — Encounter: Payer: Self-pay | Admitting: Pulmonary Disease

## 2010-09-24 ENCOUNTER — Other Ambulatory Visit (INDEPENDENT_AMBULATORY_CARE_PROVIDER_SITE_OTHER): Payer: Medicare Other

## 2010-09-24 ENCOUNTER — Encounter: Payer: Self-pay | Admitting: Endocrinology

## 2010-09-24 ENCOUNTER — Ambulatory Visit (INDEPENDENT_AMBULATORY_CARE_PROVIDER_SITE_OTHER): Payer: Medicare Other | Admitting: Endocrinology

## 2010-09-24 DIAGNOSIS — N259 Disorder resulting from impaired renal tubular function, unspecified: Secondary | ICD-10-CM

## 2010-09-24 DIAGNOSIS — D649 Anemia, unspecified: Secondary | ICD-10-CM

## 2010-09-24 LAB — BASIC METABOLIC PANEL
BUN: 28 mg/dL — ABNORMAL HIGH (ref 6–23)
CO2: 23 mEq/L (ref 19–32)
GFR: 34.29 mL/min — ABNORMAL LOW (ref >60.00)
Glucose, Bld: 106 mg/dL — ABNORMAL HIGH (ref 70–99)
Potassium: 5.1 mEq/L (ref 3.5–5.1)
Sodium: 139 mEq/L (ref 135–145)

## 2010-09-24 LAB — CBC WITH DIFFERENTIAL/PLATELET
Basophils Absolute: 0 10*3/uL (ref 0.0–0.1)
Eosinophils Absolute: 0.8 10*3/uL — ABNORMAL HIGH (ref 0.0–0.7)
HCT: 27.8 % — ABNORMAL LOW (ref 39.0–52.0)
Hemoglobin: 9.6 g/dL — ABNORMAL LOW (ref 13.0–17.0)
Lymphs Abs: 1.6 10*3/uL (ref 0.7–4.0)
MCHC: 34.6 g/dL (ref 30.0–36.0)
Monocytes Absolute: 1.7 10*3/uL — ABNORMAL HIGH (ref 0.1–1.0)
Monocytes Relative: 15.7 % — ABNORMAL HIGH (ref 3.0–12.0)
Neutro Abs: 6.6 10*3/uL (ref 1.4–7.7)
Platelets: 137 10*3/uL — ABNORMAL LOW (ref 150.0–400.0)
RDW: 13.2 % (ref 11.5–14.6)

## 2010-09-24 MED ORDER — ALPRAZOLAM 0.25 MG PO TABS: 0.2500 mg | ORAL_TABLET | Freq: Every evening | ORAL | Status: DC | PRN

## 2010-09-24 NOTE — Progress Notes (Signed)
Subjective:    Patient ID: Brandon [name], male    DOB: 07-Feb-1926, 77 y.o.   MRN: [id number]  HPI The state of at least three ongoing medical problems is addressed today: Pt continues to have poor appetite and weight loss.  Low-back pain: he does not take oxycodone, because his pain is less, and he does not like the way it makes him feel.  Depression: pt says this is mild to moderate.  He says remeron has not helped.   Anemia: he stopped the procrit due to cost. Past Medical History  Diagnosis Date  . COPD (chronic obstructive pulmonary disease)   . Osteoporosis   . GERD (gastroesophageal reflux disease)   . Hypertension   . Anemia   . Arthritis   . Depression   . Cerebrovascular disease, unspecified 02/2008    Carotid U/S- right internal carotid 60-79%, left internal carotid artery 40-59%  . Secondary hyperparathyroidism (of renal origin)   . Other and unspecified hyperlipidemia   . Peripheral vascular disease, unspecified   . Atrial fibrillation   . Unspecified disorder resulting from impaired renal function   . Hiatal hernia   . Hyperkalemia     Due to ACE Inhibitors  . Varicosities     LE  . CVA (cerebral infarction)     Past Surgical History  Procedure Date  . Lumbar fusion 01/2008    History   Social History  . Marital Status: Married    Spouse Name: N/A    Number of Children: N/A  . Years of Education: N/A   Occupational History  . Not on file.   Social History Main Topics  . Smoking status: Former Smoker    Types: Cigarettes    Quit date: 05/09/2001  . Smokeless tobacco: Not on file  . Alcohol Use: No  . Drug Use: No  . Sexually Active:    Other Topics Concern  . Not on file   Social History Narrative  . No narrative on file    Current Outpatient Prescriptions on File Prior to Visit  Medication Sig Dispense Refill  . amLODipine (NORVASC) 10 MG tablet Take 10 mg by mouth daily.        Marland Kitchen aspirin 81 MG tablet Take 81 mg by mouth daily.         . budesonide-formoterol (SYMBICORT) 160-4.5 MCG/ACT inhaler Inhale 2 puffs into the lungs 2 (two) times daily.        . calcitRIOL (ROCALTROL) 0.25 MCG capsule Take 0.25 mcg by mouth daily.        Marland Kitchen losartan-hydrochlorothiazide (HYZAAR) 100-12.5 MG per tablet Take 1 tablet by mouth daily.  30 tablet  3  . mirtazapine (REMERON) 7.5 MG tablet TAKE 1 TABLET AT BEDTIME  30 tablet  2  . Multiple Vitamin (MULTIVITAMIN) tablet Take 1 tablet by mouth daily.        Marland Kitchen omeprazole (PRILOSEC) 20 MG capsule TAKE 1 CAPSULE TWICE A DAY .. (TAKE 30 MINUTES BEFORE MEALS)  60 capsule  10  . OxyCODONE HCl, Abuse Deter, 5 MG TABS Take by mouth. 1-2 tablets every 4 hours as needed for pain       . rosuvastatin (CRESTOR) 20 MG tablet Take 20 mg by mouth daily.        . Tamsulosin HCl (FLOMAX) 0.4 MG CAPS Take 0.4 mg by mouth daily.        Marland Kitchen tiotropium (SPIRIVA) 18 MCG inhalation capsule Place 18 mcg into inhaler and inhale daily.  No Known Allergies  Family History  Problem Relation Age of Onset  . Cancer Neg Hx     Colon Cancer  . Stroke Father   . Coronary artery disease Father     BP 142/62  Pulse 80  Temp(Src) 97.2 F (36.2 C) (Oral)  Ht 5\' 10"  (1.778 m)  Wt 162 lb 12.8 oz (73.846 kg)  BMI 23.36 kg/m2  SpO2 97%   Review of Systems Denies insomnia and anxiety.      Objective:   Physical Exam GENERAL: no distress ABDOMEN: abdomen is soft, nontender.  no hepatosplenomegaly.   not distended.  no hernia Psych:  depressed affect    Lab Results  Component Value Date   WBC 10.9* 09/24/2010   HGB 9.6* 09/24/2010   HCT 27.8* 09/24/2010   MCV 98.0 09/24/2010   PLT 137.0* 09/24/2010   Creat is also noted   Assessment & Plan:  Weight loss, persistent, but may be due to pain and depression Anemia, worse Depression, needs increased rx

## 2010-09-24 NOTE — Consult Note (Signed)
NAME:  Brandon [name], Brandon [name] NO.:  [id number]   MEDICAL RECORD NO.:  [id number]          PATIENT TYPE:  INP   LOCATION:  4706                         FACILITY:  [facility]   PHYSICIAN:  Iva Boop, M.D. LHCDATE OF BIRTH:  1926/03/25   DATE OF CONSULTATION:  09/19/2005  DATE OF DISCHARGE:                                   CONSULTATION   CONSULTING PHYSICIAN:  Iva Boop, M.D. [facility].   REQUESTING PHYSICIAN:  Arvilla Meres, M.D. [facility], who is covering for  Salvadore Farber, M.D. [facility].   REASON FOR CONSULTATION:  Coffee ground emesis and chest pain.   ASSESSMENT:  1.  Coffee ground emesis and chest pain.  He has ruled out for myocardial      infarction.  It could be reflux related or Fosamax injury.  2.  There is a mild anemia as well.   RECOMMENDATIONS:  Plan to proceed with upper GI endoscopy today to evaluate  these signs and symptoms.  Risks, benefits, indications are explained.  He  understands and agrees to proceed.   HISTORY:  A 77 year old white man that was admitted with chest pain.  He had  been developed some increased fatigue and decreased exercise tolerance over  the past three months.  Yesterday while driving, he had mid sternal chest  pressure lasting 20 to 30 minutes.  No sweats, shortness of breath, or  palpitations.  He was admitted to the hospital, heparinized, and given four  81 mg aspirin.  He was started to be ruled out for MI.  Subsequently, he  developed coffee ground emesis and his heparin was discontinued.  Heartburn  proceeded the emesis.  He had some increased abdominal girth but stable  weight.  No dysphagia, no bowel habit changes.  He has had a screening  colonoscopy in the past number of years by Dr. Melvia Heaps that was  negative for polyps.   DRUG ALLERGIES:  None known.   MEDICATIONS:  Lisinopril, HCTZ, multivitamin, aspirin 325 mg daily, Spiriva  daily, Fosamax weekly, Crestor all prior to admission.   Here in the  hospital:  1.  Protonix 40 mg IV every 12 hours.  2.  Nitroglycerin drip which was discontinued.   PAST MEDICAL HISTORY:  1.  Claudication and peripheral vascular disease, being followed and      evaluated by Dr. Samule Ohm.  2.  Type 2 diabetes mellitus versus glucose intolerance.  3.  COPD/emphysema.  4.  Dyslipidemia.  5.  Hypertension.  6.  Degenerative joint disease in the hips, followed by Dr. Charlann Boxer.  7.  Osteoporosis (He is on Fosamax).  8.  Psoriasis.  9.  Mild renal insufficiency.   SOCIAL HISTORY:  He lives with his wife.  He quit smoking a number of years  ago.  Alcohol, stopped in 1981.  He is a retired Art gallery manager.   FAMILY HISTORY:  No colon cancer.   REVIEW OF SYSTEMS:  Some irregular heart beat problems.  He has had hip  pain.  He has received two steroid injections for his hip pain.  He has  psoriasis problems.  He has intermittent claudication issues.  All other  systems are negative.  He is due to have a stress sometime in the near  future.   PHYSICAL EXAMINATION:  GENERAL:  Reveals a pleasant elderly white man in no  acute distress.  VITAL SIGNS:  Temperature 97.5, blood pressure 140/46, pulse 72,  respirations 17.  EYES:  Anicteric.  ENT:  Normal mouth, posterior pharynx.  NECK:  Supple.  No thyromegaly.  CHEST:  Clear.  HEART:  S1 S2.  There are some premature beats.  No murmurs.  ABDOMEN:  Soft.  There is a mild ventral hernia in the midline upper abdomen  that is soft and reducible.  There is no organomegaly or mass.  RECTAL:  Deferred.  EXTREMITIES:  No edema.  NEUROLOGIC/PSYCHIATRIC:  He is alert and oriented x3.  SKIN:  No acute rash.   LABORATORY:  Hemoglobin 11.7 and 12.6, hematocrit 34 and 35, white count  12.3, platelets 207.  Coags:  His PT was normal.  He was ruled out for MI  with enzymes.  LFTs normal.  B-MET shows a potassium of 5.1 today.  BUN 45, creatinine 1.4, glucose 114.  EKG demonstrates what I think are  premature beats versus some  atrial fibrillation which is transient.  Initial  EKG showed normal sinus rhythm with occasional premature atrial complexes.  I really think that is what the second EKG is as well.      Iva Boop, M.D. [facility]  Electronically Signed     CEG/MEDQ  D:  09/20/2005  T:  09/20/2005  Job:  [id number]   cc:   Salvadore Farber, M.D. [facility]  [address]  [address]  [place]  [place] [address]   Cleophas Dunker. Everardo All, M.D. [facility]  [address]  [place]  [place] [address]   Madlyn Frankel. Charlann Boxer, M.D.  Fax: [phone]

## 2010-09-24 NOTE — Patient Instructions (Addendum)
Here is a prescription for "alprazolam," to take as needed for depression.  You may want to start with 1/2 pill at a time, as the side-effect is drowsiness. blood tests are being ordered for you today.  please call [phone] to hear your test results.  You will be prompted to enter the 9-digit "MRN" number that appears at the top left of this page, followed by #.  Then you will hear the message. Please make a follow-up appointment in 6 weeks (update: i left message on phone-tree:  We'll continue to follow your creat (pt has seen nephrol in the past).  You should consider resumption of the procrit.   Stop mobic)

## 2010-09-24 NOTE — Discharge Summary (Signed)
NAME:  Brandon [name], Brandon [name] NO.:  [id number]   MEDICAL RECORD NO.:  [id number]          PATIENT TYPE:  INP   LOCATION:  4706                         FACILITY:  [facility]   PHYSICIAN:  Jennye Moccasin, P.A. LHCDATE OF BIRTH:  1925/12/30   DATE OF ADMISSION:  09/18/2005  DATE OF DISCHARGE:  09/21/2005                                 DISCHARGE SUMMARY   ADMITTING DIAGNOSIS:  1.  Chest pressure, rule out unstable angina.  2.  History of hypertension.  Minoxidil was discontinued, in the past      because of low blood pressures.  3.  Hyperlipidemia treated with Crestor.  4.  Intermittent claudication with peripheral artery disease.  ABI of 0.72      and 0.56 and stable.  5.  Chronic mild renal insufficiency, baseline creatinine 1.4-1.6.  6.  Chronic obstructive pulmonary disease with FEV-1 of 1.8 liters followed      by Dr. Delford Field.  He quit a 55-pack-year tobacco history in 2003.  7.  Osteoporosis for which he uses Fosamax.  8.  Musculoskeletal pain for which she uses Naprosyn.  9.  Status post prior screening colonoscopy.  10. Psoriasis.   DISCHARGE DIAGNOSES:  1.  Coffee-grounds emesis with findings of severe esophagitis as well as      duodenal ulcer on upper endoscopy.  2.  Slight a decrease of TSH, question sick euthyroid.  Will follow up with      Dr. Everardo All on May 23.  Esophagitis probably secondary to a combination      of problems including:  Fungal elements, reflux, Fosamax, as well as      nonsteroidal anti-inflammatory use.  3.  Anemia..  Certainly an element of acute blood loss anemia is responsible      for this.  He did not require transfusion with blood products.  4.  Noncardiac chest pain secondary to severe esophagitis.  5.  Osteoporosis.  The Fosamax will be on hold for the next several weeks.  6.  Degenerative joint disease. We plan to discontinue Naprosyn.  7.  Glucose intolerance with blood sugars reading as high as 116 during this      admission.  8.   Premature atrial contractions.  Not felt to have atrial fibrillation      suggested by the automated reading of the EKG.  9.  Fatigue and exercise tolerance.  Prior echocardiogram is normal.  A      stress Myoview scheduled, but has yet to be performed.  No prior cardiac      catheterization.   CONSULTANTS:  Iva Boop, M.D.; patient ultimately transferred to Dr.  Marvell Fuller service.   PROCEDURES:  Upper endoscopy by Dr. Leone Payor on April 14.  Findings included  severe grade D esophagitis; also found was a 10-mm, clean-based, duodenal  ulcer as well as a 1 cm ulcer and 2 small ulcers at the duodenal apex.  Biopsy of the esophagus showed ulcerated esophageal mucosa; and some  structures present which were suspicious for fungal spores.  No dysplasia or  malignancy identified.  No intestinal metaplasia present.  CLO  testing for  Helicobacter pylori was negative.   BRIEF HISTORY:  Mr. Mehlhoff is a pleasant 77 year old white gentleman who has  had fatigue and decreasing exercise tolerance for the past 3 months.  He  developed about 30 minutes of mid sternal chest pressure while driving, and  proceeded to the emergency room.  He was given aspirin and started on  heparin.  Enzymes came back any he ruled out for MI.  However, he developed  coffee-ground emesis, after which the heparin was discontinued.  He does say  that heartburn proceeded the emesis.  He had never had any prior GI  problems.  The patient does use Fosamax as well as Naprosyn in addition to a  daily full-strength aspirin.   LABORATORIES:  A CLO test negative.  Hemoglobin 11.9, hematocrit 35 at  admission.  The hemoglobin was 11.3 at discharge.  Platelets ranging 171,000  up to 207,000.  PT 13.3, INR 1.0.  PTT 26.  D-dimer 0.53.  Sodium initially  134, corrected to 138; potassium initially 5.7, this  normalized to 5;  BUN  initially 43, and dropped to 26; creatinine initially 1.6, dropped to 1.6;  albumin 3.2.  Total  bilirubin, alkaline phosphatase, AST, ALT--all within  normal limits.  Glycosylated hemoglobin 5.6.  CK, CK/MB 40/2.6, 43/2.5.  Troponin I 0.02, 0.02.  Cholesterol total 148, triglycerides 149, HDL  cholesterol 77.  The cholesterol total and HDL ratio 1.9.  The LDL  cholesterol 30, LDL cholesterol 41.  TSH 0.218. Chest x-ray showed no active  cardiopulmonary disease.  It did show emphysema and some multilevel  degenerative disk disease of the thoracic spine.   HOSPITAL COURSE ON A PROBLEM BY PROBLEM BASIS:  Problem #1:  CHEST PAIN.  The patient ruled out for MI.  His EKGs did show some premature atrial  contractions.  Chest pain resolved within a few hours.  He was started on a  proton pump inhibitor.  Because of his fatigue and decreasing exercise  tolerance, the plan from Dr. Prescott Gum point of view was  to have him  follow up with Dr. Samule Ohm for the stress Myoview study.  He had an  appointment to see Dr. Samule Ohm in the office on May 24.   Problem #2:  SEVERE ESOPHAGITIS AND COFFEE-GROUND EMESIS:  The patient did  throw up some coffee-grounds material.  The source of this was determined to  be the severe esophagitis.  The cause of this is certainly multifactorial,  including the Fosamax which she takes regularly once a week, reflux, and  possibly fungal/candidal related.  We used proton pump inhibitors of  Protonix to treat this.  His Fosamax, aspirin and Naprosyn were  discontinued.  He will go home on Protonix.  Dr. Leone Payor did not feel that  the patient's esophagitis was candidal; and that it possibly was a  contaminant.  He felt that the esophageal looked like it had been injured  from the reflux; and possibly from the Fosamax.  With treatment, the patient  chest pain resolved and did not return.  He did have some dark stools, but  eventually these turned brown.   Problem #3:  MINOR ANEMIA ASSOCIATED WITH ACUTE BLOOD LOSS ANEMIA.  The patient's hemoglobin stabilized and he did  not require any transfusion with  any blood cell products.   Problem #4:  DECREASED TSH. It was noted that his TSH was slightly  diminished.  We discussed this, over the phone, with Dr. Rene Paci.  She said  that in this situation, it is very possible that he had a diagnosis  of sick euthyroid; and she felt that there would not be any problem with him  coming back to have his thyroid tests followed up as an outpatient with Dr.  Everardo All; that it did not need direct addressing at the present time.  He has  an appointment to follow up with Dr. Everardo All on May 23.   MEDICATIONS AT DISCHARGE:  1.  Lisinopril 20 mg twice daily.  2.  Hydrochlorothiazide 12.5 mg twice daily.  3.  Multivitamin once daily.  4.  Crestor 40 mg daily.  5.  Spiriva inhaler.  6.  Protonix 40 mg twice daily.  7.  Tylenol Extra Strength 2 q.6 h. as needed.  8.  NO Fosamax, aspirin, or Naprosyn--unless these were approved for restart      by his physician.  9.  Note, if the patient is unable to get Protonix or afford it, he can      substitute with Prilosec OTC instead, but he should notify Dr. Leone Payor      of this substitution.   FOLLOW UP APPOINTMENTS:  With Dr. Everardo All on May 23, with Dr. Samule Ohm on May  24 and with Dr. Leone Payor on June 4.   DIET:  To minimize caffeine and ascitic fluids.  He is to eat a bland diet  for the next few days; and advanced to spicy foods and full seasonings as  well as ascitic food over the next several days.   CONDITION AT DISCHARGE:  Stable and improved.      Jennye Moccasin, P.A. [facility]     SG/MEDQ  D:  09/21/2005  T:  09/22/2005  Job:  [id number]   cc:   Salvadore Farber, M.D. [facility]  [address]  [address]  [place]  [place] [address]   Iva Boop, M.D. [facility]  [address] [place], [place] [address]   Gregary Signs A. Everardo All, M.D. [facility]  [address]  [place]  [place] [address]   Madlyn Frankel. Charlann Boxer, M.D.  Fax: [phone]

## 2010-09-24 NOTE — H&P (Signed)
NAME:  Brandon [name], Brandon [name] NO.:  [id number]   MEDICAL RECORD NO.:  [id number]          PATIENT TYPE:  INP   LOCATION:  1824                         FACILITY:  [facility]   PHYSICIAN:  Arvilla Meres, M.D. LHCDATE OF BIRTH:  10-02-1925   DATE OF ADMISSION:  09/18/2005  DATE OF DISCHARGE:                                HISTORY & PHYSICAL   PRIMARY CARE PHYSICIAN:  Dr. Everardo All   CARDIOLOGIST:  Dr. Randa Evens   REASON FOR ADMISSION:  Chest pressure concerning for unstable angina.   HISTORY OF PRESENT ILLNESS:  Brandon [name] is a 77 year old male with a history  of peripheral arterial disease, hypertension and hyperlipidemia.  There is  no known history of coronary artery disease.  He has never had a previous  cardiac catheterization or any chest discomfort.  He was recently referred  to Dr. Samule Ohm by Dr. Everardo All on Sep 08, 2005 secondary to a 2- to 24-month  history of progressive fatigue and exercise intolerance.  A 2-D  echocardiogram was obtained which was normal by the patient's report.  He is  scheduled for a stress Myoview but this is yet to be performed.   He tells me that this morning around 11:30 he was in the car when he had the  abrupt onset of central chest pressure radiating to his neck.  There is no  shortness of breath, nausea, vomiting or diaphoresis.  Pain was  approximately 8/10.  It lasted 30 minutes and it resolved spontaneously.  Given his concern over the chest pressure he came to the emergency room.  His first set of point-of-care markers was negative.  EKG was normal.  He  has had no further chest pain but does have occasional heartburn for which  he has been treated with a GI cocktail.   REVIEW OF SYSTEMS:  He denies any heart failure symptoms.  He does have mild  intermittent claudication.  No abdominal pain, nausea and vomiting.  No  melena or bright red blood per rectum.  The remainder of the review of  systems is negative as per HPI and  problem list.   PAST MEDICAL HISTORY:  1.  Hypertension, previously on minoxidil but stopped due to low blood      pressures.  2.  Hyperlipidemia on Crestor.  3.  Peripheral arterial disease with intermittent claudication.      1.  ABIs of 0.72 and 0.56 which are stable.  4.  Mild chronic renal insufficiency, baseline creatinine 1.4 to 1.6.  5.  COPD.      1.  Followed by Dr. Delford Field.  FEV-1 of 1.8 liters.      2.  History of tobacco use one pack per day x55 years, quit in 2003.   CURRENT MEDICATIONS INCLUDE:  1.  Lisinopril 20 mg b.i.d.  2.  Hydrochlorothiazide 12.5 mg b.i.d.  3.  Fosamax 70 mg a day.  4.  Multivitamin.  5.  Aspirin 325 a day.  6.  Crestor 40 a day.  7.  Spiriva.   ALLERGIES:  NO KNOWN DRUG ALLERGIES.   SOCIAL HISTORY:  He  lives in [place].  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  His hobbies are woodworking and working in his  garden.  Tobacco one pack per day x55 years, quit in 2003.  No alcohol.   FAMILY HISTORY:  Father died of stroke, had a history of also coronary  artery disease died at age 72.  Mother died of unknown causes 15.  Sister  drowned at age 75.   PHYSICAL EXAMINATION:  GENERAL:  He is lying in bed in no acute distress  except for some mild indigestion.  HEENT:  Sclerae anicteric.  EOMI.  There is no xanthelasma.  Mucous  membranes are moist.  Neck is supple.  There is no JVD.  Carotids are 2+  bilaterally.  There is a soft right carotid bruit.  There is no thyromegaly  or lymphadenopathy.  CARDIAC:  Distant heart sounds, regular rate and rhythm.  No obvious  murmurs, rubs or gallops.  LUNGS:  Lungs have decreased air movement throughout but are otherwise  clear.  No rales or wheezing.  ABDOMEN:  Soft, nontender, nondistended, no hepatosplenomegaly, no bruits or  masses are appreciated.  Good bowel sounds.  EXTREMITIES:  Warm with no cyanosis, clubbing or edema.  Distal pulses are  nonpalpable.  NEURO:  He is  alert and oriented x3.  Cranial nerves II-XII are intact.  He  moves all four extremities without difficulty.  Affect is pleasant.   STUDIES:  EKG shows normal sinus rhythm with PACs at a rate of 69.  There  are no ST-T wave abnormalities.  No chest x-ray has been performed yet.  Point-of-care markers:  CK-MB of 2.5, troponin of less than 0.05.  Sodium is  134, potassium 5.7, chloride of 110, BUN of 43, creatinine of 1.6, glucose  of 95.   ASSESSMENT AND PLAN:  CHEST PAIN.  Given his peripheral arterial disease and  other risk factors, I have a high suspicion of underlying coronary artery  disease and unstable angina especially in the setting of his recent lethargy  and exercise intolerance.  We will admit him and cycle his enzymes.  He will  be treated with nitroglycerin, heparin and gentle beta blockade.  We will  continue his aspirin.  We will not start Plavix due to the possibility of  significant multivessel disease.  I would favor cardiac catheterization  though I will discuss this with Dr. Samule Ohm in the morning.  We will hydrate  him overnight and hold his ACE inhibitor and diuretics to further protect  his kidneys.  He will also need an outpatient carotid ultrasound.      Arvilla Meres, M.D. [facility]  Electronically Signed     DB/MEDQ  D:  09/18/2005  T:  09/18/2005  Job:  [id number]

## 2010-09-28 ENCOUNTER — Encounter: Payer: Self-pay | Admitting: Pulmonary Disease

## 2010-09-28 ENCOUNTER — Ambulatory Visit (INDEPENDENT_AMBULATORY_CARE_PROVIDER_SITE_OTHER): Payer: Medicare Other | Admitting: Pulmonary Disease

## 2010-09-28 ENCOUNTER — Telehealth: Payer: Self-pay

## 2010-09-28 DIAGNOSIS — R0609 Other forms of dyspnea: Secondary | ICD-10-CM

## 2010-09-28 DIAGNOSIS — J449 Chronic obstructive pulmonary disease, unspecified: Secondary | ICD-10-CM

## 2010-09-28 DIAGNOSIS — J4489 Other specified chronic obstructive pulmonary disease: Secondary | ICD-10-CM

## 2010-09-28 DIAGNOSIS — R0989 Other specified symptoms and signs involving the circulatory and respiratory systems: Secondary | ICD-10-CM

## 2010-09-28 NOTE — Telephone Encounter (Signed)
Pt's spouse called stating that she received message on phone tree system about pt's low Hemoglobin. Spouse states that Dr Shirline Frees said that Procrit was not helping pt and continuing medication would no help. Spouse is requesting alternating replacement therapy.

## 2010-09-28 NOTE — Telephone Encounter (Signed)
Pt's spouse advised 

## 2010-09-28 NOTE — Telephone Encounter (Signed)
i reviewed last ov with dr Arbutus Ped.  He sais the next option would be a combination of 2 meds, and that he wanted to see you back to address this.  Please make f/u appt with him.

## 2010-09-28 NOTE — Patient Instructions (Signed)
Stay on symbicort 160/4.5 2 inhalations am and pm everyday.  Rinse mouth well Stay on spiriva one inhalation each am everyday Work on some type of conditioning program followup with me in 6mos.

## 2010-09-28 NOTE — Progress Notes (Signed)
  Subjective:    Patient ID: Brandon [name], male    DOB: 1926/01/08, 77 y.o.   MRN: [id number]  HPI The pt is an 77y/o male who I have been asked to see for breathing issues.  He has known at least moderate airflow obstruction from pfts 2006, but a very good response to bronchodilators.  He has noted worsening breathing issues for at least 6mos, but have gotten worse the past 4 weeks.  He describes doe at 11/2 blocks at a moderate pace, and will get winded breathing groceries in from the car.  He notes increased cough and congestion above usual baseline, but is only bringing up scant white mucus.  He has been tried on spiriva and symbicort, but did not see a response with addition of symbicort.  Currently, he is not using either compliantly.  He has no h/o heart disease, and tells me recent cardiac w/u unrevealing.  However, I do not see any record of w/u in the computer.  He denies any issues with LE edema.    Review of Systems  Constitutional: Negative for fever and unexpected weight change.  HENT: Negative for ear pain, nosebleeds, congestion, sore throat, rhinorrhea, sneezing, trouble swallowing, dental problem, postnasal drip and sinus pressure.   Eyes: Negative for redness and itching.  Respiratory: Positive for cough, shortness of breath and wheezing. Negative for chest tightness.   Cardiovascular: Negative for palpitations and leg swelling.  Gastrointestinal: Negative for nausea and vomiting.  Genitourinary: Negative for dysuria.  Musculoskeletal: Negative for joint swelling.  Skin: Negative for rash.  Neurological: Negative for headaches.  Hematological: Does not bruise/bleed easily.  Psychiatric/Behavioral: Positive for dysphoric mood. The patient is nervous/anxious.        Objective:   Physical Exam Constitutional:  Well developed, no acute distress. Appears frail  HENT:  Nares patent without discharge  Oropharynx without exudate, palate and uvula are normal  Eyes:  Perrla,  eomi, no scleral icterus  Neck:  No JVD, no TMG  Cardiovascular:  Normal rate, regular rhythm, no rubs or gallops.  2/6 sem        Intact distal pulses  Pulmonary :  Very decreased breath sounds, no stridor or respiratory distress   No rales, rhonchi, or wheezing  Abdominal:  Soft, nondistended, bowel sounds present.  No tenderness noted.   Musculoskeletal:  No lower extremity edema noted.  Lymph Nodes:  No cervical lymphadenopathy noted  Skin:  No cyanosis noted  Neurologic:  Appears sleepy, but appropriate, moves all 4 extremities without obvious deficit.         Assessment & Plan:

## 2010-09-28 NOTE — Assessment & Plan Note (Signed)
This is really multifactorial, and his lung disease represents only a part of this.  He has significant anemia, severe deconditioning and chronic disabling back pain, takes narcotics that his wife states makes him sleepy and lethargic.  All of these are issues that will have a significant impact on his breathing.

## 2010-09-28 NOTE — Assessment & Plan Note (Signed)
The pt has known emphysema with an asthmatic component based on pfts in 2006.  He has not been taking his meds compliantly, and I have stressed the need to do so.  He understands this is only a part of his overall doe.  He is going to start taking his meds on a regular basis, and will see how much improvement he has.

## 2010-09-29 ENCOUNTER — Encounter: Payer: Self-pay | Admitting: Critical Care Medicine

## 2010-10-06 ENCOUNTER — Encounter (HOSPITAL_BASED_OUTPATIENT_CLINIC_OR_DEPARTMENT_OTHER): Payer: Medicare Other | Admitting: Internal Medicine

## 2010-10-06 ENCOUNTER — Other Ambulatory Visit: Payer: Self-pay | Admitting: Internal Medicine

## 2010-10-06 DIAGNOSIS — D638 Anemia in other chronic diseases classified elsewhere: Secondary | ICD-10-CM

## 2010-10-06 DIAGNOSIS — I10 Essential (primary) hypertension: Secondary | ICD-10-CM

## 2010-10-06 DIAGNOSIS — N189 Chronic kidney disease, unspecified: Secondary | ICD-10-CM

## 2010-10-06 DIAGNOSIS — D509 Iron deficiency anemia, unspecified: Secondary | ICD-10-CM

## 2010-10-06 LAB — CBC & DIFF AND RETIC
BASO%: 0 % (ref 0.0–2.0)
EOS%: 5 % (ref 0.0–7.0)
Eosinophils Absolute: 0.4 10*3/uL (ref 0.0–0.5)
HCT: 26.8 % — ABNORMAL LOW (ref 38.4–49.9)
Immature Retic Fract: 10.9 % (ref 0.00–13.40)
MONO#: 1.3 10*3/uL — ABNORMAL HIGH (ref 0.1–0.9)
MONO%: 15.5 % — ABNORMAL HIGH (ref 0.0–14.0)
NEUT#: 5.1 10*3/uL (ref 1.5–6.5)
Platelets: 132 10*3/uL — ABNORMAL LOW (ref 140–400)
Retic %: 2.84 % — ABNORMAL HIGH (ref 0.50–1.60)
Retic Ct Abs: 77.25 10*3/uL (ref 24.10–77.50)
WBC: 8.5 10*3/uL (ref 4.0–10.3)

## 2010-10-07 LAB — IRON AND TIBC
TIBC: 257 ug/dL (ref 215–435)
UIBC: 223 ug/dL

## 2010-10-07 LAB — VITAMIN B12: Vitamin B-12: 508 pg/mL (ref 211–911)

## 2010-10-07 LAB — FERRITIN: Ferritin: 551 ng/mL — ABNORMAL HIGH (ref 22–322)

## 2010-10-07 LAB — FOLATE RBC: RBC Folate: 2069 ng/mL (ref >=366)

## 2010-10-11 ENCOUNTER — Ambulatory Visit: Payer: MEDICARE | Admitting: Endocrinology

## 2010-10-13 ENCOUNTER — Encounter: Payer: Self-pay | Admitting: Endocrinology

## 2010-10-13 ENCOUNTER — Ambulatory Visit (INDEPENDENT_AMBULATORY_CARE_PROVIDER_SITE_OTHER)
Admission: RE | Admit: 2010-10-13 | Discharge: 2010-10-13 | Disposition: A | Discharge status: Home / Self Care | Payer: Medicare Other | Source: Ambulatory Visit | Attending: Endocrinology | Admitting: Endocrinology

## 2010-10-13 ENCOUNTER — Ambulatory Visit (INDEPENDENT_AMBULATORY_CARE_PROVIDER_SITE_OTHER): Payer: Medicare Other | Admitting: Endocrinology

## 2010-10-13 VITALS — BP 92/48 | HR 99 | Temp 96.8°F | Ht 69.0 in | Wt 153.8 lb

## 2010-10-13 DIAGNOSIS — R05 Cough: Secondary | ICD-10-CM

## 2010-10-13 DIAGNOSIS — I6789 Other cerebrovascular disease: Secondary | ICD-10-CM

## 2010-10-13 MED ORDER — METHYLPREDNISOLONE (PAK) 4 MG PO TABS: ORAL_TABLET | ORAL | Status: AC

## 2010-10-13 MED ORDER — DOXYCYCLINE HYCLATE 100 MG PO TABS: 100.0000 mg | ORAL_TABLET | Freq: Two times a day (BID) | ORAL | Status: AC

## 2010-10-13 NOTE — Patient Instructions (Addendum)
Stop amlodipine.   Continue your inhalers. i have sent a prescription to your pharmacy for an antibiotic, and for a steroid "pack." Check chest x-ray today.  please call [phone] to hear your test results.  You will be prompted to enter the 9-digit "MRN" number that appears at the top left of this page, followed by #.  Then you will hear the message. Please make a follow-up appointment in 2-3 weeks. Here is a meter and some strips to check your blood sugar. Try cutting your pain medicine in 1/2 at a time.   Go to the emergency room if you get worse. (update: i left message on phone-tree (and also on pt's home-phone, with cxr result.  Also stop hyzaar, and ret for recheck in 2 days.  Go to er if worse

## 2010-10-13 NOTE — Progress Notes (Signed)
Subjective:    Patient ID: Brandon [name], male    DOB: Oct 14, 1925, 77 y.o.   MRN: [id number]  HPI 4 days of prod-quality cough in the chest, and assoc wheezing and sob.  dtr says pt has decreased po intake. Past Medical History  Diagnosis Date  . COPD (chronic obstructive pulmonary disease)   . Osteoporosis   . GERD (gastroesophageal reflux disease)   . Hypertension   . Anemia   . Arthritis   . Depression   . Cerebrovascular disease, unspecified 02/2008    Carotid U/S- right internal carotid 60-79%, left internal carotid artery 40-59%  . Secondary hyperparathyroidism (of renal origin)   . Other and unspecified hyperlipidemia   . Peripheral vascular disease, unspecified   . Atrial fibrillation   . Unspecified disorder resulting from impaired renal function   . Hiatal hernia   . Hyperkalemia     Due to ACE Inhibitors  . Varicosities     LE  . CVA (cerebral infarction)     Past Surgical History  Procedure Date  . Lumbar fusion 01/2008    History   Social History  . Marital Status: Married    Spouse Name: Erskine Squibb    Number of Children: Y  . Years of Education: N/A   Occupational History  . Art gallery manager     retired   Social History Main Topics  . Smoking status: Former Smoker -- 1.0 packs/day for 38 years    Types: Cigarettes    Quit date: 05/09/1982  . Smokeless tobacco: Not on file  . Alcohol Use: No  . Drug Use: No  . Sexually Active: Not on file   Other Topics Concern  . Not on file   Social History Narrative  . No narrative on file    Current Outpatient Prescriptions on File Prior to Visit  Medication Sig Dispense Refill  . ALPRAZolam (XANAX) 0.25 MG tablet Take 1 tablet (0.25 mg total) by mouth at bedtime as needed for sleep.  50 tablet  0  . aspirin 81 MG tablet Take 81 mg by mouth daily.        . budesonide-formoterol (SYMBICORT) 160-4.5 MCG/ACT inhaler Inhale 2 puffs into the lungs 2 (two) times daily.        . calcitRIOL (ROCALTROL) 0.25 MCG capsule  Take 0.25 mcg by mouth daily.        Marland Kitchen losartan-hydrochlorothiazide (HYZAAR) 100-12.5 MG per tablet Take 1 tablet by mouth daily.  30 tablet  3  . Multiple Vitamin (MULTIVITAMIN) tablet Take 1 tablet by mouth daily.        Marland Kitchen omeprazole (PRILOSEC) 20 MG capsule TAKE 1 CAPSULE TWICE A DAY .. (TAKE 30 MINUTES BEFORE MEALS)  60 capsule  10  . OxyCODONE HCl, Abuse Deter, 5 MG TABS Take by mouth. 1-2 tablets every 4 hours as needed for pain       . rosuvastatin (CRESTOR) 20 MG tablet Take 20 mg by mouth daily.        . Tamsulosin HCl (FLOMAX) 0.4 MG CAPS Take 0.4 mg by mouth daily as needed.       . tiotropium (SPIRIVA) 18 MCG inhalation capsule Place 18 mcg into inhaler and inhale daily.        Marland Kitchen DISCONTD: amLODipine (NORVASC) 10 MG tablet Take 10 mg by mouth daily.          No Known Allergies  Family History  Problem Relation Age of Onset  . Cancer Neg Hx  Colon Cancer  . Stroke Father   . Coronary artery disease Father   . Heart attack Father     BP 92/48  Pulse 99  Temp(Src) 96.8 F (36 C) (Oral)  Ht 5\' 9"  (1.753 m)  Wt 153 lb 12.8 oz (69.763 kg)  BMI 22.71 kg/m2  SpO2 96%    Review of Systems Denies fever and nasal congestion.    Objective:   Physical Exam GENERAL: elderly, frail, no distress LUNGS:  Clear to auscultation, except for a few rales at the right base. Skin: pale, not diaphoretic    CXR: infiltrate is present. Assessment & Plan:  Pneumonia, new. Low-back pain.  Percocet will help cough also. htn overcontrolled, prob due to decreased po intake. H/o hyperglycemia.  He i at risk for dm due to steroids.

## 2010-10-14 ENCOUNTER — Encounter (HOSPITAL_BASED_OUTPATIENT_CLINIC_OR_DEPARTMENT_OTHER): Payer: Medicare Other | Admitting: Internal Medicine

## 2010-10-14 ENCOUNTER — Other Ambulatory Visit: Payer: Self-pay

## 2010-10-14 DIAGNOSIS — D638 Anemia in other chronic diseases classified elsewhere: Secondary | ICD-10-CM

## 2010-10-14 MED ORDER — BUDESONIDE-FORMOTEROL FUMARATE 160-4.5 MCG/ACT IN AERO: 2.0000 | INHALATION_SPRAY | Freq: Two times a day (BID) | RESPIRATORY_TRACT | Status: DC

## 2010-10-14 NOTE — Telephone Encounter (Signed)
Pt's daughter called request refill on pt's behalf

## 2010-10-15 ENCOUNTER — Ambulatory Visit (INDEPENDENT_AMBULATORY_CARE_PROVIDER_SITE_OTHER): Payer: Medicare Other | Admitting: Internal Medicine

## 2010-10-15 ENCOUNTER — Encounter: Payer: Self-pay | Admitting: Internal Medicine

## 2010-10-15 ENCOUNTER — Ambulatory Visit (INDEPENDENT_AMBULATORY_CARE_PROVIDER_SITE_OTHER)
Admission: RE | Admit: 2010-10-15 | Discharge: 2010-10-15 | Disposition: A | Discharge status: Home / Self Care | Payer: Medicare Other | Source: Ambulatory Visit | Attending: Internal Medicine | Admitting: Internal Medicine

## 2010-10-15 VITALS — BP 128/70 | HR 82 | Temp 97.0°F | Ht 70.0 in | Wt 153.4 lb

## 2010-10-15 DIAGNOSIS — D509 Iron deficiency anemia, unspecified: Secondary | ICD-10-CM

## 2010-10-15 DIAGNOSIS — J189 Pneumonia, unspecified organism: Secondary | ICD-10-CM

## 2010-10-15 DIAGNOSIS — I959 Hypotension, unspecified: Secondary | ICD-10-CM

## 2010-10-15 NOTE — Patient Instructions (Signed)
It was good to see you today. We have reviewed your prior records including labs and tests today Repeat chest xray ordered today. Your results will be called to you after review (48-72hours after test completion). If any changes need to be made, you will be notified at that time. Medications reviewed, no changes at this time. Remain off hyzaar and amlodipine for now Please schedule followup in 1-2 weeks with Dr. Everardo All, call sooner if problems.

## 2010-10-16 ENCOUNTER — Encounter: Payer: Self-pay | Admitting: Internal Medicine

## 2010-10-16 NOTE — Progress Notes (Signed)
Subjective:    Patient ID: Brandon [name], male    DOB: 1926-04-21, 77 y.o.   MRN: [id number]  HPI  Here for follow up PNA - Seen by PCP SAE 48h ago - SOB, cough and low BP cxr showed B ASD c/w pneumonia - tx with abx + pred pak; holding amlodipine and hyzaar Sob persists but cough and fatigue improved - no fever, no lethargy, no rash or headache Out of symbicort due to $ - otherwise patient/dtr reports compliance with medication(s) as prescribed.  Denies adverse med side effects.  Past Medical History  Diagnosis Date  . COPD (chronic obstructive pulmonary disease)   . Osteoporosis   . GERD (gastroesophageal reflux disease)   . Hypertension   . Anemia   . Arthritis   . Depression   . Cerebrovascular disease, unspecified 02/2008    Carotid U/S- right internal carotid 60-79%, left internal carotid artery 40-59%  . Secondary hyperparathyroidism (of renal origin)   . Other and unspecified hyperlipidemia   . Peripheral vascular disease, unspecified   . Atrial fibrillation   . Unspecified disorder resulting from impaired renal function   . Hiatal hernia   . Hyperkalemia     Due to ACE Inhibitors  . Varicosities     LE  . CVA (cerebral infarction)      Review of Systems  Constitutional: Negative for chills and appetite change.  Respiratory: Negative for wheezing.   Cardiovascular: Negative for chest pain and palpitations.  Psychiatric/Behavioral: Negative for confusion.       Objective:   Physical Exam BP 128/70  Pulse 82  Temp(Src) 97 F (36.1 C) (Oral)  Ht 5\' 10"  (1.778 m)  Wt 153 lb 6.4 oz (69.582 kg)  BMI 22.01 kg/m2  SpO2 97%  Physical Exam  Constitutional: he appears well-developed and well-nourished. No distress.  dtr at side Neck: Normal range of motion. Neck supple. No JVD present. No thyromegaly present.  Cardiovascular: Normal rate, regular rhythm and normal heart sounds.  No murmur heard. no BLE edema Pulmonary/Chest: Effort normal and breath sounds  with few rhonchi B base. No respiratory distress. no wheezes.  Skin: Skin is warm and dry.  No erythema or ulceration.  Psychiatric: he has a normal mood and affect. behavior is normal. Judgment and thought content normal. min confused (baseline)  BP Readings from Last 3 Encounters:  10/15/10 128/70  10/13/10 92/48  09/28/10 96/54    CXR 6/6 reviewed - B patchy ASD, COPD changes    Lab Results  Component Value Date   WBC 10.9* 09/24/2010   HGB 8.6* 10/06/2010   HCT 26.8* 10/06/2010   PLT 132* 10/06/2010   CHOL 207* 07/12/2010   TRIG 581.0* 07/12/2010   HDL 60.80 07/12/2010   LDLDIRECT 86.4 07/12/2010   ALT 14 07/12/2010   AST 21 07/12/2010   NA 139 09/24/2010   K 5.1 09/24/2010   CL 108 09/24/2010   CREATININE 2.0* 09/24/2010   BUN 28* 09/24/2010   CO2 23 09/24/2010   TSH 0.56 07/12/2010   PSA 0.91 07/12/2010   HGBA1C 6.0 07/12/2010      Assessment & Plan:  pneumonia - improved pulm symptoms - afeb, tol abd and pred pak without problems - recheck CXR now but O2 sats good - expect will need to follow serially until clear - f/u PCP 1-2 weeks on same  Hypotension in setting of pneumonia - resolved holding antiHTN meds - cont same and reeval for need to resume with PCP in  1-2 weeks due to HTN hx  Anemia - also follows with hemeonc for same - planning epo shots and iron - cont oral iron anf follow up heme as planned, no need txfn  Time spent with pt/family today 25 minutes, greater than 50% time spent counseling patient on pneumonia, BP, anemia and medication review. Also review of prior records

## 2010-10-19 ENCOUNTER — Encounter (HOSPITAL_BASED_OUTPATIENT_CLINIC_OR_DEPARTMENT_OTHER): Payer: Medicare Other | Admitting: Internal Medicine

## 2010-10-19 ENCOUNTER — Other Ambulatory Visit: Payer: Self-pay | Admitting: Internal Medicine

## 2010-10-19 DIAGNOSIS — D509 Iron deficiency anemia, unspecified: Secondary | ICD-10-CM

## 2010-10-19 DIAGNOSIS — D638 Anemia in other chronic diseases classified elsewhere: Secondary | ICD-10-CM

## 2010-10-19 DIAGNOSIS — I10 Essential (primary) hypertension: Secondary | ICD-10-CM

## 2010-10-19 DIAGNOSIS — N189 Chronic kidney disease, unspecified: Secondary | ICD-10-CM

## 2010-10-19 LAB — CBC WITH DIFFERENTIAL/PLATELET
Basophils Absolute: 0.1 10*3/uL (ref 0.0–0.1)
Eosinophils Absolute: 0.6 10*3/uL — ABNORMAL HIGH (ref 0.0–0.5)
HGB: 9.5 g/dL — ABNORMAL LOW (ref 13.0–17.1)
MCV: 97.1 fL (ref 79.3–98.0)
MONO#: 1.9 10*3/uL — ABNORMAL HIGH (ref 0.1–0.9)
MONO%: 13.7 % (ref 0.0–14.0)
NEUT#: 8.8 10*3/uL — ABNORMAL HIGH (ref 1.5–6.5)
RBC: 2.97 10*6/uL — ABNORMAL LOW (ref 4.20–5.82)
RDW: 13.5 % (ref 11.0–14.6)
WBC: 13.8 10*3/uL — ABNORMAL HIGH (ref 4.0–10.3)
lymph#: 2.4 10*3/uL (ref 0.9–3.3)

## 2010-10-27 ENCOUNTER — Ambulatory Visit: Payer: Medicare Other | Admitting: Endocrinology

## 2010-11-01 ENCOUNTER — Ambulatory Visit (INDEPENDENT_AMBULATORY_CARE_PROVIDER_SITE_OTHER)
Admission: RE | Admit: 2010-11-01 | Discharge: 2010-11-01 | Disposition: A | Discharge status: Home / Self Care | Payer: Medicare Other | Source: Ambulatory Visit | Attending: Endocrinology | Admitting: Endocrinology

## 2010-11-01 ENCOUNTER — Ambulatory Visit (INDEPENDENT_AMBULATORY_CARE_PROVIDER_SITE_OTHER): Payer: Medicare Other | Admitting: Endocrinology

## 2010-11-01 ENCOUNTER — Encounter: Payer: Self-pay | Admitting: Endocrinology

## 2010-11-01 VITALS — BP 100/58 | HR 92 | Temp 98.4°F | Ht 69.0 in | Wt 153.4 lb

## 2010-11-01 DIAGNOSIS — J189 Pneumonia, unspecified organism: Secondary | ICD-10-CM

## 2010-11-01 MED ORDER — OXYCODONE HCL 5 MG PO TABA: 1.0000 | ORAL_TABLET | ORAL | Status: DC | PRN

## 2010-11-01 MED ORDER — LOSARTAN POTASSIUM 50 MG PO TABS: 50.0000 mg | ORAL_TABLET | Freq: Every day | ORAL | Status: DC

## 2010-11-01 NOTE — Patient Instructions (Addendum)
recheck your chest x-ray today.  please call [phone] to hear your test results.  You will be prompted to enter the 9-digit "MRN" number that appears at the top left of this page, followed by #.  Then you will hear the message. Please make a follow-up appointment in 1 month. You can resume your pain medication.  Here is a prescription. Stop hyzaar.  In 5 days, start cozaar 50 mg daily. (update: i left message on phone-tree:  We'll recheck cxr upon return)

## 2010-11-01 NOTE — Progress Notes (Signed)
Subjective:    Patient ID: Brandon [name], male    DOB: [birth year], 77 y.o.   MRN: [id number]  HPI The state of at least three ongoing medical problems is addressed today: Pneumonia: sob and cough are improved.   Htn: Pt is off the amlodipine, but he has resumed the hyzaar, 1 tab qd.  Denies edema Low-back pain:  Pt says he needs the oxy-ir, and he now tolerates it well. Past Medical History  Diagnosis Date  . COPD (chronic obstructive pulmonary disease)   . Osteoporosis   . GERD (gastroesophageal reflux disease)   . Hypertension   . Anemia   . Arthritis   . Depression   . Cerebrovascular disease, unspecified 02/2008    Carotid U/S- right internal carotid 60-79%, left internal carotid artery 40-59%  . Secondary hyperparathyroidism (of renal origin)   . Other and unspecified hyperlipidemia   . Peripheral vascular disease, unspecified   . Atrial fibrillation   . Unspecified disorder resulting from impaired renal function   . Hiatal hernia   . Hyperkalemia     Due to ACE Inhibitors  . Varicosities     LE  . CVA (cerebral infarction)     Past Surgical History  Procedure Date  . Lumbar fusion 01/2008    History   Social History  . Marital Status: Married    Spouse Name: Erskine Squibb    Number of Children: Y  . Years of Education: N/A   Occupational History  . Art gallery manager     retired   Social History Main Topics  . Smoking status: Former Smoker -- 1.0 packs/day for 38 years    Types: Cigarettes    Quit date: 05/09/1982  . Smokeless tobacco: Not on file  . Alcohol Use: No  . Drug Use: No  . Sexually Active: Not on file   Other Topics Concern  . Not on file   Social History Narrative  . No narrative on file    Current Outpatient Prescriptions on File Prior to Visit  Medication Sig Dispense Refill  . aspirin 81 MG tablet Take 81 mg by mouth daily.        . budesonide-formoterol (SYMBICORT) 160-4.5 MCG/ACT inhaler Inhale 2 puffs into the lungs 2 (two) times daily.  1  Inhaler  2  . calcitRIOL (ROCALTROL) 0.25 MCG capsule Take 0.25 mcg by mouth daily.        . Multiple Vitamin (MULTIVITAMIN) tablet Take 1 tablet by mouth daily.        Marland Kitchen omeprazole (PRILOSEC) 20 MG capsule TAKE 1 CAPSULE TWICE A DAY .. (TAKE 30 MINUTES BEFORE MEALS)  60 capsule  10  . rosuvastatin (CRESTOR) 20 MG tablet Take 20 mg by mouth daily.        . Tamsulosin HCl (FLOMAX) 0.4 MG CAPS Take 0.4 mg by mouth daily as needed.       . tiotropium (SPIRIVA) 18 MCG inhalation capsule Place 18 mcg into inhaler and inhale daily.          No Known Allergies  Family History  Problem Relation Age of Onset  . Cancer Neg Hx     Colon Cancer  . Stroke Father   . Coronary artery disease Father   . Heart attack Father     BP 100/58  Pulse 92  Temp(Src) 98.4 F (36.9 C) (Oral)  Ht 5\' 9"  (1.753 m)  Wt 153 lb 6.4 oz (69.582 kg)  BMI 22.65 kg/m2  SpO2 94%  Review of Systems  Denies fever and sob    Objective:   Physical Exam LUNGS:  Clear to auscultation Ext: no edema    Cxr: persistent infiltrate  Assessment & Plan:  Pneumonia, clinically better but with persistent infiltrate. Low-back pain, well-controlled. Htn, overcontrolled

## 2010-11-05 ENCOUNTER — Ambulatory Visit: Payer: Medicare Other | Admitting: Endocrinology

## 2010-11-12 ENCOUNTER — Other Ambulatory Visit: Payer: Self-pay | Admitting: Internal Medicine

## 2010-11-12 ENCOUNTER — Encounter (HOSPITAL_BASED_OUTPATIENT_CLINIC_OR_DEPARTMENT_OTHER): Payer: Medicare Other | Admitting: Internal Medicine

## 2010-11-12 DIAGNOSIS — N039 Chronic nephritic syndrome with unspecified morphologic changes: Secondary | ICD-10-CM

## 2010-11-12 DIAGNOSIS — D631 Anemia in chronic kidney disease: Secondary | ICD-10-CM

## 2010-11-12 DIAGNOSIS — D638 Anemia in other chronic diseases classified elsewhere: Secondary | ICD-10-CM

## 2010-11-12 DIAGNOSIS — N189 Chronic kidney disease, unspecified: Secondary | ICD-10-CM

## 2010-11-12 LAB — CBC WITH DIFFERENTIAL/PLATELET
Basophils Absolute: 0 10*3/uL (ref 0.0–0.1)
Eosinophils Absolute: 0.4 10*3/uL (ref 0.0–0.5)
HGB: 9.4 g/dL — ABNORMAL LOW (ref 13.0–17.1)
MCV: 94.9 fL (ref 79.3–98.0)
MONO#: 1 10*3/uL — ABNORMAL HIGH (ref 0.1–0.9)
MONO%: 16.9 % — ABNORMAL HIGH (ref 0.0–14.0)
NEUT#: 2.8 10*3/uL (ref 1.5–6.5)
RBC: 2.98 10*6/uL — ABNORMAL LOW (ref 4.20–5.82)
RDW: 14.8 % — ABNORMAL HIGH (ref 11.0–14.6)
WBC: 6 10*3/uL (ref 4.0–10.3)
lymph#: 1.7 10*3/uL (ref 0.9–3.3)

## 2010-11-24 ENCOUNTER — Telehealth: Payer: Self-pay | Admitting: *Deleted

## 2010-11-24 NOTE — Telephone Encounter (Signed)
No answer, unable to leave message °

## 2010-11-24 NOTE — Telephone Encounter (Signed)
Caller [wife] c/o elevated BP for patient.; States that Pt was d/c from Hyzaar 100-12.5mg  due to hypotension and placed on Cozaar 50mg  Caller did not give any BP reading numbers in phone message; [facility] in return call to obtain these numbers so that assessment can be made for patient's medication.

## 2010-11-24 NOTE — Telephone Encounter (Signed)
Ov 11/25/10

## 2010-11-26 NOTE — Telephone Encounter (Signed)
Left message for pt to callback office to schedule appointment for BP today

## 2010-11-30 ENCOUNTER — Encounter (HOSPITAL_BASED_OUTPATIENT_CLINIC_OR_DEPARTMENT_OTHER): Payer: Medicare Other | Admitting: Internal Medicine

## 2010-11-30 ENCOUNTER — Other Ambulatory Visit: Payer: Self-pay | Admitting: Internal Medicine

## 2010-11-30 DIAGNOSIS — N189 Chronic kidney disease, unspecified: Secondary | ICD-10-CM

## 2010-11-30 DIAGNOSIS — D638 Anemia in other chronic diseases classified elsewhere: Secondary | ICD-10-CM

## 2010-11-30 LAB — CBC WITH DIFFERENTIAL/PLATELET
Basophils Absolute: 0 10*3/uL (ref 0.0–0.1)
Eosinophils Absolute: 0.2 10*3/uL (ref 0.0–0.5)
HCT: 31.1 % — ABNORMAL LOW (ref 38.4–49.9)
HGB: 10.5 g/dL — ABNORMAL LOW (ref 13.0–17.1)
LYMPH%: 27.3 % (ref 14.0–49.0)
MCV: 96.1 fL (ref 79.3–98.0)
MONO#: 1 10*3/uL — ABNORMAL HIGH (ref 0.1–0.9)
MONO%: 16.3 % — ABNORMAL HIGH (ref 0.0–14.0)
NEUT#: 3.3 10*3/uL (ref 1.5–6.5)
NEUT%: 52.9 % (ref 39.0–75.0)
Platelets: 86 10*3/uL — ABNORMAL LOW (ref 140–400)
WBC: 6.2 10*3/uL (ref 4.0–10.3)

## 2010-12-06 ENCOUNTER — Ambulatory Visit (INDEPENDENT_AMBULATORY_CARE_PROVIDER_SITE_OTHER)
Admission: RE | Admit: 2010-12-06 | Discharge: 2010-12-06 | Disposition: A | Discharge status: Home / Self Care | Payer: Medicare Other | Source: Ambulatory Visit | Attending: Endocrinology | Admitting: Endocrinology

## 2010-12-06 ENCOUNTER — Encounter: Payer: Self-pay | Admitting: Endocrinology

## 2010-12-06 ENCOUNTER — Ambulatory Visit (INDEPENDENT_AMBULATORY_CARE_PROVIDER_SITE_OTHER): Payer: Medicare Other | Admitting: Endocrinology

## 2010-12-06 VITALS — BP 140/80 | HR 67 | Temp 98.8°F | Ht 69.0 in | Wt 155.0 lb

## 2010-12-06 DIAGNOSIS — J189 Pneumonia, unspecified organism: Secondary | ICD-10-CM

## 2010-12-06 DIAGNOSIS — I10 Essential (primary) hypertension: Secondary | ICD-10-CM

## 2010-12-06 DIAGNOSIS — G309 Alzheimer's disease, unspecified: Secondary | ICD-10-CM

## 2010-12-06 MED ORDER — DONEPEZIL HCL 10 MG PO TABS: 10.0000 mg | ORAL_TABLET | Freq: Every evening | ORAL | Status: DC | PRN

## 2010-12-06 NOTE — Progress Notes (Signed)
Subjective:    Patient ID: Brandon [name], male    DOB: 11/04/1925, 77 y.o.   MRN: [id number]  HPI He feels better now.  Denies cough and sob. Wife reports bp's at home fluctuate from 120-140 systolic. Wife reports few mos of moderate memory loss.  It was not worse in the context of his recent illness.  No assoc headache. Back pain has recurred Past Medical History  Diagnosis Date  . COPD (chronic obstructive pulmonary disease)   . Osteoporosis   . GERD (gastroesophageal reflux disease)   . Hypertension   . Anemia   . Arthritis   . Depression   . Cerebrovascular disease, unspecified 02/2008    Carotid U/S- right internal carotid 60-79%, left internal carotid artery 40-59%  . Secondary hyperparathyroidism (of renal origin)   . Other and unspecified hyperlipidemia   . Peripheral vascular disease, unspecified   . Atrial fibrillation   . Unspecified disorder resulting from impaired renal function   . Hiatal hernia   . Hyperkalemia     Due to ACE Inhibitors  . Varicosities     LE  . CVA (cerebral infarction)     Past Surgical History  Procedure Date  . Lumbar fusion 01/2008    History   Social History  . Marital Status: Married    Spouse Name: Erskine Squibb    Number of Children: Y  . Years of Education: N/A   Occupational History  . Art gallery manager     retired   Social History Main Topics  . Smoking status: Former Smoker -- 1.0 packs/day for 38 years    Types: Cigarettes    Quit date: 05/09/1982  . Smokeless tobacco: Not on file  . Alcohol Use: No  . Drug Use: No  . Sexually Active: Not on file   Other Topics Concern  . Not on file   Social History Narrative  . No narrative on file    Current Outpatient Prescriptions on File Prior to Visit  Medication Sig Dispense Refill  . aspirin 81 MG tablet Take 81 mg by mouth daily.        . budesonide-formoterol (SYMBICORT) 160-4.5 MCG/ACT inhaler Inhale 2 puffs into the lungs 2 (two) times daily.  1 Inhaler  2  . calcitRIOL  (ROCALTROL) 0.25 MCG capsule Take 0.25 mcg by mouth daily.        Marland Kitchen losartan (COZAAR) 50 MG tablet Take 1 tablet (50 mg total) by mouth daily.  30 tablet  11  . Multiple Vitamin (MULTIVITAMIN) tablet Take 1 tablet by mouth daily.        Marland Kitchen omeprazole (PRILOSEC) 20 MG capsule TAKE 1 CAPSULE TWICE A DAY .. (TAKE 30 MINUTES BEFORE MEALS)  60 capsule  10  . OxyCODONE HCl, Abuse Deter, 5 MG TABS Take 1 tablet by mouth every 4 (four) hours as needed (for pain). 1-2 tablets every 4 hours as needed for pain  100 tablet  0  . rosuvastatin (CRESTOR) 20 MG tablet Take 20 mg by mouth daily.        . Tamsulosin HCl (FLOMAX) 0.4 MG CAPS Take 0.4 mg by mouth daily as needed.       . tiotropium (SPIRIVA) 18 MCG inhalation capsule Place 18 mcg into inhaler and inhale daily.          No Known Allergies  Family History  Problem Relation Age of Onset  . Cancer Neg Hx     Colon Cancer  . Stroke Father   . Coronary  artery disease Father   . Heart attack Father     BP 140/80  Pulse 67  Temp(Src) 98.8 F (37.1 C) (Oral)  Ht 5\' 9"  (1.753 m)  Wt 155 lb (70.308 kg)  BMI 22.89 kg/m2  SpO2 95%  Review of Systems Denies fever and falls    Objective:   Physical Exam GENERAL: no distress Alert, 0x3.  He does not recall what he had for dinner last night.       Cxr: nad Assessment & Plan:  Pneumonia, resolved Htn, with labile component Dementia, new Low-back pain, recurrent

## 2010-12-06 NOTE — Patient Instructions (Addendum)
recheck your chest x-ray today.  please call [phone] to hear your test results.  You will be prompted to enter the 9-digit "MRN" number that appears at the top left of this page, followed by #.  Then you will hear the message. Please make a follow-up appointment in 1 month. You can resume your pain medication.  Here is a prescription. Continue the same cozaar 50 mg daily, for now. Start donepezil 10 mg daily.  The first week, take only 1/2 pill.

## 2010-12-11 DIAGNOSIS — F0280 Dementia in other diseases classified elsewhere without behavioral disturbance: Secondary | ICD-10-CM | POA: Insufficient documentation

## 2010-12-14 ENCOUNTER — Telehealth: Payer: Self-pay

## 2010-12-14 NOTE — Telephone Encounter (Signed)
Pt's spouse called stating she believes Aricept 10 mg to be too strong for pt. Spouse is requesting to reduce medication to 5 mg, if okay, can 10 mg tab to cut in half?

## 2010-12-14 NOTE — Telephone Encounter (Signed)
Ok to cut in 1/2  

## 2010-12-14 NOTE — Telephone Encounter (Signed)
Pt's spouse advised 

## 2010-12-21 ENCOUNTER — Encounter (HOSPITAL_BASED_OUTPATIENT_CLINIC_OR_DEPARTMENT_OTHER): Payer: Medicare Other | Admitting: Internal Medicine

## 2010-12-21 ENCOUNTER — Other Ambulatory Visit: Payer: Self-pay | Admitting: Internal Medicine

## 2010-12-21 DIAGNOSIS — D638 Anemia in other chronic diseases classified elsewhere: Secondary | ICD-10-CM

## 2010-12-21 LAB — CBC WITH DIFFERENTIAL/PLATELET
BASO%: 0.3 % (ref 0.0–2.0)
HCT: 36.9 % — ABNORMAL LOW (ref 38.4–49.9)
LYMPH%: 32 % (ref 14.0–49.0)
MCHC: 32 g/dL (ref 32.0–36.0)
MCV: 96.6 fL (ref 79.3–98.0)
MONO#: 1 10*3/uL — ABNORMAL HIGH (ref 0.1–0.9)
MONO%: 14.3 % — ABNORMAL HIGH (ref 0.0–14.0)
NEUT%: 48.8 % (ref 39.0–75.0)
Platelets: 98 10*3/uL — ABNORMAL LOW (ref 140–400)
RBC: 3.82 10*6/uL — ABNORMAL LOW (ref 4.20–5.82)
WBC: 6.8 10*3/uL (ref 4.0–10.3)
nRBC: 0 % (ref 0–0)

## 2010-12-24 ENCOUNTER — Other Ambulatory Visit: Payer: Self-pay

## 2010-12-24 MED ORDER — OXYCODONE HCL 5 MG PO TABA: 1.0000 | ORAL_TABLET | ORAL | Status: DC | PRN

## 2010-12-24 NOTE — Telephone Encounter (Signed)
Pt informed, Rx in cabinet for pt pick up  

## 2011-01-03 ENCOUNTER — Other Ambulatory Visit: Payer: Self-pay | Admitting: Internal Medicine

## 2011-01-03 ENCOUNTER — Encounter (HOSPITAL_BASED_OUTPATIENT_CLINIC_OR_DEPARTMENT_OTHER): Payer: Medicare Other | Admitting: Internal Medicine

## 2011-01-03 DIAGNOSIS — N189 Chronic kidney disease, unspecified: Secondary | ICD-10-CM

## 2011-01-03 DIAGNOSIS — I10 Essential (primary) hypertension: Secondary | ICD-10-CM

## 2011-01-03 DIAGNOSIS — D638 Anemia in other chronic diseases classified elsewhere: Secondary | ICD-10-CM

## 2011-01-03 DIAGNOSIS — D509 Iron deficiency anemia, unspecified: Secondary | ICD-10-CM

## 2011-01-03 LAB — CBC & DIFF AND RETIC
Basophils Absolute: 0 10*3/uL (ref 0.0–0.1)
EOS%: 8.9 % — ABNORMAL HIGH (ref 0.0–7.0)
Eosinophils Absolute: 0.6 10*3/uL — ABNORMAL HIGH (ref 0.0–0.5)
HCT: 34 % — ABNORMAL LOW (ref 38.4–49.9)
HGB: 11.1 g/dL — ABNORMAL LOW (ref 13.0–17.1)
Immature Retic Fract: 10.5 % (ref 3.00–10.60)
MCH: 30.7 pg (ref 27.2–33.4)
MCV: 94.2 fL (ref 79.3–98.0)
NEUT#: 2.7 10*3/uL (ref 1.5–6.5)
NEUT%: 40.3 % (ref 39.0–75.0)
RDW: 14.4 % (ref 11.0–14.6)
Retic Ct Abs: 83.39 10*3/uL (ref 34.80–93.90)
lymph#: 2.2 10*3/uL (ref 0.9–3.3)

## 2011-01-03 LAB — IRON AND TIBC
Iron: 84 ug/dL (ref 42–165)
TIBC: 271 ug/dL (ref 215–435)

## 2011-01-03 LAB — FOLATE: Folate: >20 ng/mL

## 2011-01-03 LAB — FERRITIN: Ferritin: 328 ng/mL — ABNORMAL HIGH (ref 22–322)

## 2011-01-11 ENCOUNTER — Encounter (HOSPITAL_BASED_OUTPATIENT_CLINIC_OR_DEPARTMENT_OTHER): Payer: Medicare Other | Admitting: Internal Medicine

## 2011-01-11 ENCOUNTER — Other Ambulatory Visit: Payer: Self-pay | Admitting: Internal Medicine

## 2011-01-11 DIAGNOSIS — D638 Anemia in other chronic diseases classified elsewhere: Secondary | ICD-10-CM

## 2011-01-11 LAB — CBC WITH DIFFERENTIAL/PLATELET
Basophils Absolute: 0 10*3/uL (ref 0.0–0.1)
EOS%: 8.1 % — ABNORMAL HIGH (ref 0.0–7.0)
Eosinophils Absolute: 0.4 10*3/uL (ref 0.0–0.5)
HGB: 11.1 g/dL — ABNORMAL LOW (ref 13.0–17.1)
LYMPH%: 42 % (ref 14.0–49.0)
MCH: 32 pg (ref 27.2–33.4)
MCV: 93.3 fL (ref 79.3–98.0)
MONO%: 13.5 % (ref 0.0–14.0)
NEUT#: 2 10*3/uL (ref 1.5–6.5)
NEUT%: 36.2 % — ABNORMAL LOW (ref 39.0–75.0)
Platelets: 84 10*3/uL — ABNORMAL LOW (ref 140–400)
RDW: 15.3 % — ABNORMAL HIGH (ref 11.0–14.6)

## 2011-01-25 ENCOUNTER — Other Ambulatory Visit: Payer: Self-pay | Admitting: Endocrinology

## 2011-01-26 ENCOUNTER — Ambulatory Visit (INDEPENDENT_AMBULATORY_CARE_PROVIDER_SITE_OTHER): Payer: Medicare Other | Admitting: Endocrinology

## 2011-01-26 ENCOUNTER — Telehealth: Payer: Self-pay

## 2011-01-26 ENCOUNTER — Encounter: Payer: Self-pay | Admitting: Endocrinology

## 2011-01-26 VITALS — BP 146/80 | HR 63 | Temp 97.9°F | Ht 69.0 in | Wt 156.0 lb

## 2011-01-26 DIAGNOSIS — M519 Unspecified thoracic, thoracolumbar and lumbosacral intervertebral disc disorder: Secondary | ICD-10-CM | POA: Insufficient documentation

## 2011-01-26 MED ORDER — LOSARTAN POTASSIUM-HCTZ 100-25 MG PO TABS: 1.0000 | ORAL_TABLET | Freq: Every day | ORAL | Status: DC

## 2011-01-26 MED ORDER — LOSARTAN POTASSIUM 100 MG PO TABS: 100.0000 mg | ORAL_TABLET | Freq: Every day | ORAL | Status: DC

## 2011-01-26 NOTE — Telephone Encounter (Signed)
Given that pt is now on losartan-hctz 100/12.5, i have increased to 100/25, 1 qd.

## 2011-01-26 NOTE — Telephone Encounter (Signed)
Pt's spouse called stating pt's BP medication was supposed to be increase per OV this morning but they received Rx for Losartan 100 mg. I called and spoke with the pt who advised be that he has been taking Losartan HCTZ 100-12.5 mg although he was told to D/C this medication and start on Losartan 50 mg 11/04/2010 ( he said he was unaware of med change). Please advise, what medication should pt be taking and what is the correct dosage?

## 2011-01-26 NOTE — Telephone Encounter (Signed)
Pt advised.

## 2011-01-26 NOTE — Patient Instructions (Addendum)
recheck your chest x-ray today.  please call [phone] to hear your test results.  You will be prompted to enter the 9-digit "MRN" number that appears at the top left of this page, followed by #.  Then you will hear the message. Increase losartan to 100 mg daily.   Please re-try the aricept.  For the first week, take just 1/2 pill. Please make a follow-up appointment in 6 weeks. Stop the symbicort on a trial basis.  If you develop shortness of breath, we can do the breathing test then Refer to a "physical medicine" specialist.  you will receive a phone call, about a day and time for an appointment.   (update: i left message on phone-tree:  rx as we discussed)

## 2011-01-26 NOTE — Progress Notes (Signed)
Subjective:    Patient ID: Brandon [name], male    DOB: 08-24-1925, 77 y.o.   MRN: [id number]  HPI Pt had to d/c bp meds when he developed pneumonia.  He takes cozaar as rx'ed.  He says symbicort does not help him. Pt stopped aricept due to nightmares.  Pt says he does not need it, but wife says he does.   Pt has a few years of moderate pain at the lower back, but no assoc numbness Past Medical History  Diagnosis Date  . COPD (chronic obstructive pulmonary disease)   . Osteoporosis   . GERD (gastroesophageal reflux disease)   . Hypertension   . Anemia   . Arthritis   . Depression   . Cerebrovascular disease, unspecified 02/2008    Carotid U/S- right internal carotid 60-79%, left internal carotid artery 40-59%  . Secondary hyperparathyroidism (of renal origin)   . Other and unspecified hyperlipidemia   . Peripheral vascular disease, unspecified   . Atrial fibrillation   . Unspecified disorder resulting from impaired renal function   . Hiatal hernia   . Hyperkalemia     Due to ACE Inhibitors  . Varicosities     LE  . CVA (cerebral infarction)     Past Surgical History  Procedure Date  . Lumbar fusion 01/2008    History   Social History  . Marital Status: Married    Spouse Name: Erskine Squibb    Number of Children: Y  . Years of Education: N/A   Occupational History  . Art gallery manager     retired   Social History Main Topics  . Smoking status: Former Smoker -- 1.0 packs/day for 38 years    Types: Cigarettes    Quit date: 05/09/1982  . Smokeless tobacco: Not on file  . Alcohol Use: No  . Drug Use: No  . Sexually Active: Not on file   Other Topics Concern  . Not on file   Social History Narrative  . No narrative on file    Current Outpatient Prescriptions on File Prior to Visit  Medication Sig Dispense Refill  . aspirin 81 MG tablet Take 81 mg by mouth daily.        . calcitRIOL (ROCALTROL) 0.25 MCG capsule Take 0.25 mcg by mouth daily.        . Multiple Vitamin  (MULTIVITAMIN) tablet Take 1 tablet by mouth daily.        Marland Kitchen omeprazole (PRILOSEC) 20 MG capsule TAKE 1 CAPSULE TWICE A DAY .. (TAKE 30 MINUTES BEFORE MEALS)  60 capsule  10  . OxyCODONE HCl, Abuse Deter, 5 MG TABS Take 1 tablet by mouth every 4 (four) hours as needed (for pain). 1-2 tablets every 4 hours as needed for pain  100 tablet  0  . rosuvastatin (CRESTOR) 20 MG tablet Take 20 mg by mouth daily.        . Tamsulosin HCl (FLOMAX) 0.4 MG CAPS Take 0.4 mg by mouth daily as needed.       . tiotropium (SPIRIVA) 18 MCG inhalation capsule Place 18 mcg into inhaler and inhale daily.        Marland Kitchen donepezil (ARICEPT) 10 MG tablet Take 1 tablet (10 mg total) by mouth at bedtime as needed.  30 tablet  11    No Known Allergies  Family History  Problem Relation Age of Onset  . Cancer Neg Hx     Colon Cancer  . Stroke Father   . Coronary artery disease Father   .  Heart attack Father    BP 146/80  Pulse 63  Temp(Src) 97.9 F (36.6 C) (Oral)  Ht 5\' 9"  (1.753 m)  Wt 156 lb (70.761 kg)  BMI 23.04 kg/m2  SpO2 97%  Review of Systems Denies falls and urinary hesitancy.      Objective:   Physical Exam VITAL SIGNS:  See vs page GENERAL: no distress LUNGS:  Clear to auscultation Gait: slow but steady.       Assessment & Plan:  Low-back pain, needs increased rx Htn, needs increased rx Dementia, needs increased rx Wheezing, much better.

## 2011-01-31 ENCOUNTER — Telehealth: Payer: Self-pay | Admitting: *Deleted

## 2011-01-31 DIAGNOSIS — J189 Pneumonia, unspecified organism: Secondary | ICD-10-CM

## 2011-01-31 NOTE — Telephone Encounter (Signed)
Pt's wife called on behalf of pt. They forgot to get chest xray done when they were here last week at [facility]. They want to come in tomorrow to have chest xray done.

## 2011-01-31 NOTE — Telephone Encounter (Signed)
i ordered

## 2011-01-31 NOTE — Telephone Encounter (Signed)
Called pt, unable to leave message. (No answer)

## 2011-01-31 NOTE — Telephone Encounter (Signed)
Pt's spouse informed 

## 2011-02-01 ENCOUNTER — Other Ambulatory Visit: Payer: Self-pay | Admitting: Internal Medicine

## 2011-02-01 ENCOUNTER — Ambulatory Visit (INDEPENDENT_AMBULATORY_CARE_PROVIDER_SITE_OTHER)
Admission: RE | Admit: 2011-02-01 | Discharge: 2011-02-01 | Disposition: A | Discharge status: Home / Self Care | Payer: Medicare Other | Source: Ambulatory Visit | Attending: Endocrinology | Admitting: Endocrinology

## 2011-02-01 ENCOUNTER — Encounter (HOSPITAL_BASED_OUTPATIENT_CLINIC_OR_DEPARTMENT_OTHER): Payer: Medicare Other | Admitting: Internal Medicine

## 2011-02-01 DIAGNOSIS — J189 Pneumonia, unspecified organism: Secondary | ICD-10-CM

## 2011-02-01 DIAGNOSIS — D638 Anemia in other chronic diseases classified elsewhere: Secondary | ICD-10-CM

## 2011-02-01 LAB — CBC WITH DIFFERENTIAL/PLATELET
BASO%: 0.3 % (ref 0.0–2.0)
Basophils Absolute: 0 10*3/uL (ref 0.0–0.1)
HCT: 34.5 % — ABNORMAL LOW (ref 38.4–49.9)
HGB: 11.8 g/dL — ABNORMAL LOW (ref 13.0–17.1)
MONO#: 1 10*3/uL — ABNORMAL HIGH (ref 0.1–0.9)
NEUT#: 3.1 10*3/uL (ref 1.5–6.5)
NEUT%: 47.3 % (ref 39.0–75.0)
WBC: 6.5 10*3/uL (ref 4.0–10.3)
lymph#: 2 10*3/uL (ref 0.9–3.3)

## 2011-02-14 ENCOUNTER — Encounter
Payer: Medicare Other | Attending: Physical Medicine and Rehabilitation | Admitting: Physical Medicine and Rehabilitation

## 2011-02-14 DIAGNOSIS — M545 Low back pain, unspecified: Secondary | ICD-10-CM | POA: Insufficient documentation

## 2011-02-14 DIAGNOSIS — Z981 Arthrodesis status: Secondary | ICD-10-CM | POA: Insufficient documentation

## 2011-02-14 DIAGNOSIS — G894 Chronic pain syndrome: Secondary | ICD-10-CM

## 2011-02-14 DIAGNOSIS — R269 Unspecified abnormalities of gait and mobility: Secondary | ICD-10-CM | POA: Insufficient documentation

## 2011-02-14 DIAGNOSIS — M47817 Spondylosis without myelopathy or radiculopathy, lumbosacral region: Secondary | ICD-10-CM

## 2011-02-15 NOTE — Progress Notes (Signed)
[name] is an very pleasant 77 year old gentleman who is accompanied by his wife today.  [name] states that his wife made him come.  He reports some intermittent low back pain which is typically worse when he is up and walking, lying down does seem to help.  He has a history of degenerative scoliosis/stenosis and previous lumbar radiculopathy.  He underwent an anterolateral L3-4 decompression with diskectomy with PEEK cage with plating from L3-4 as well as bilateral L4- 5 foraminotomies by Dr. Maeola Harman back in March 2010.  [name] states that he believes his low back pain came on sometime after the surgery maybe about 2 years ago and, however, over the last 6 weeks it may have gotten somewhat worse.  His average pain is between a 3 and a 6 on a scale of 10.  He sleeps well without problems from his back.  Pain is worse with walking and standing and improves with rest and medication.  Pain is typically located on in both the right and left side of his low back at the lumbosacral junction over the sacroiliac joint.  FUNCTION:  He is able to walk 10 minutes at a time.  He can climb stairs.  He is driving.  He reports no falls.  He does use a cane.  He is retired.  He is independent with self-care.  REVIEW OF SYSTEMS:  Negative for bowel or bladder problems.  Admits to some depression.  Denies suicidal ideation.  Denies weakness, numbness, tremors, tingling, spasms, dizziness, confusion or anxiety.  He reports no other problems with respect to review of systems.  PAST MEDICAL HISTORY:  Significant for history of peripheral vascular disease, hypertension, atrial fibrillation, anemia of chronic disease, GERD, renal insufficiency, COPD, osteoporosis, hiatal hernia, depression, cerebral vascular disease and has a secondary hyperparathyroid.  PAST SURGICAL HISTORY:  Spine surgery on July 22, 2008, Dr. Venetia Maxon.  SOCIAL HISTORY:  The patient is married.  He lives with  his wife.  FAMILY HISTORY:  Heart disease and hypertension, CVA with father.  Age 65.  MEDICATIONS:  Medications that he is currently on include aspirin, calcitriol, donepezil, losartan, hydrochlorothiazide, multivitamin, omeprazole and oxycodone 5 mg.  The patient states he takes these very rarely.  Last one was a couple of weeks ago.  Rosuvastatin, tamsulosin and Spiriva.  ALLERGIES:  He has no known drug allergies.  PHYSICAL EXAMINATION:  Blood pressure is 161/72, pulse 78, respirations 16 and 97% saturated on room air.  He is a well-developed, well- nourished gentleman who does not appear in any distress.  He is oriented x3.  Speech is clear.  Affect is bright.  He is alert, cooperative and pleasant.  Follows commands without difficulty.  Answers my questions appropriately.  Cranial nerves and coordination are grossly intact.  His reflexes are diminished in the upper and lower extremities.  No abnormal tone, clonus or tremors are noted.  Negative Hoffman.  His reflex at the right patellar tendon is 0, left patellar tendon is 2+ and bilateral Achilles tendon is 0.  Straight leg raise is negative.  He has relatively well-preserved strength at hip flexors, knee extensors, dorsiflexors, plantar flexors and EHL is slightly weak bilaterally.  He reports diminished sensation in both lower extremities below the knees.  Internal and external rotation at the hips does not aggravate pain.  Transitions easily from sitting to standing.  Gait is nonantalgic. Tandem gait is performed relatively well.  Romberg test is performed well also.  He has  a lumbar scoliosis convex left.  He has a well-healed scar over the lumbar spine on palpation throughout cervical, thoracic and lumbar spine does not reveal any areas of tenderness.  He has no paraspinal muscular tenderness.  No tenderness over the sacral iliac region.  He has some limitations in forward flexion, extension.  However, he  does not complain of pain with motion today.  IMPRESSION:  This is an 77 year old gentleman who is status post 2 status post baths back surgery with an L3-4 fusion and decompression at L4-5 July 22, 2008, Dr. Venetia Maxon.  He has some intermittent low back pain which is worse when he has been up walking for a while.  It has been worse over the last 6 weeks.  Today, however, he states he has absolutely no pain.  He is here because he states his wife made him come.  Overall, his pain has not been getting in the way of him doing any kind of activity.  He does report that he does not have as much energy as he used to have and he does like to take naps on occasion.  I reviewed differential diagnosis regarding low back pain in the area he has indicated, although he does not have any pain today would include facet arthropathy, sacroiliac joint involvement, paraspinal muscle involvement, strain or sprain as well as intermittent nerve root problems, degenerative disk disease.  At this point, I do not believe his hips are at all contributing to this posterior low back/hip pain.  He is currently not interested in any treatment or further workup at this time.  His wife is in agreement.  They tell me that they have been using heat wraps recently and they found them to be quite effective in reducing overall pain.  At this point, I will see him back in a month.  I have answered all their questions.  They are comfortable at this point with the plan which is to continue using heat wraps on a p.r.n. basis.     [name], M.D. Electronically Signed    DMK/MedQ D:  02/14/2011 14:26:02  T:  02/15/2011 01:52:39  Job #:  [id number]

## 2011-02-17 ENCOUNTER — Telehealth: Payer: Self-pay

## 2011-02-17 MED ORDER — AMLODIPINE BESYLATE 5 MG PO TABS: 5.0000 mg | ORAL_TABLET | Freq: Every day | ORAL | Status: DC

## 2011-02-17 NOTE — Telephone Encounter (Signed)
Pt informed of MD's advisement and of new medication

## 2011-02-17 NOTE — Telephone Encounter (Signed)
Left message for pt's or pt wife to callback office

## 2011-02-17 NOTE — Telephone Encounter (Signed)
Resume amlodipine at 5 mg qd.   i sent rx Ret as sched

## 2011-02-17 NOTE — Telephone Encounter (Signed)
Pt's spouse called to inform MD that pt's BP is still elevated at 150-175/60-70. Spouse is concerned that medication may need to be increased, please advise. Spouse also states that pt was Rx'd an antidepressant this year that helped to increase pt's appetite, she is requesting to have this medication re-started. I am unsure what medication this is and neither the pt to the spouse can remember the name, please advise.

## 2011-02-18 ENCOUNTER — Telehealth: Payer: Self-pay

## 2011-02-18 NOTE — Telephone Encounter (Signed)
yes

## 2011-02-18 NOTE — Telephone Encounter (Signed)
Pt's spouse called with a question regarding pt BP meds. Is pt to take Norvasc in addition to Hyzaar?

## 2011-02-18 NOTE — Telephone Encounter (Signed)
Pt informed of MD's advisement. 

## 2011-02-22 ENCOUNTER — Other Ambulatory Visit: Payer: Self-pay | Admitting: Internal Medicine

## 2011-02-22 ENCOUNTER — Encounter (HOSPITAL_BASED_OUTPATIENT_CLINIC_OR_DEPARTMENT_OTHER): Payer: Medicare Other | Admitting: Internal Medicine

## 2011-02-22 DIAGNOSIS — N189 Chronic kidney disease, unspecified: Secondary | ICD-10-CM

## 2011-02-22 DIAGNOSIS — D638 Anemia in other chronic diseases classified elsewhere: Secondary | ICD-10-CM

## 2011-02-22 LAB — CBC WITH DIFFERENTIAL/PLATELET
Basophils Absolute: 0 10*3/uL (ref 0.0–0.1)
EOS%: 12.6 % — ABNORMAL HIGH (ref 0.0–7.0)
HCT: 31.6 % — ABNORMAL LOW (ref 38.4–49.9)
HGB: 10.9 g/dL — ABNORMAL LOW (ref 13.0–17.1)
MCH: 32.7 pg (ref 27.2–33.4)
MCV: 94.6 fL (ref 79.3–98.0)
NEUT%: 35.7 % — ABNORMAL LOW (ref 39.0–75.0)
lymph#: 2.4 10*3/uL (ref 0.9–3.3)

## 2011-02-23 ENCOUNTER — Telehealth: Payer: Self-pay

## 2011-02-23 MED ORDER — CITALOPRAM HYDROBROMIDE 10 MG PO TABS: 10.0000 mg | ORAL_TABLET | Freq: Every day | ORAL | Status: DC

## 2011-02-23 NOTE — Telephone Encounter (Signed)
i sent rx 

## 2011-02-23 NOTE — Telephone Encounter (Signed)
Pt's spouse informed of MD's advisement.  

## 2011-02-23 NOTE — Telephone Encounter (Signed)
Pt's spouse called regarding an antidepressant that pt was previously Rx'd by Dr Everardo All that increased pt's appetite. Spouse is requesting refill of medication but is unsure of name. Does SAE know which medication this is?

## 2011-02-25 ENCOUNTER — Ambulatory Visit (HOSPITAL_COMMUNITY)
Admission: RE | Admit: 2011-02-25 | Discharge: 2011-02-25 | Disposition: A | Discharge status: Home / Self Care | Payer: Medicare Other | Source: Ambulatory Visit | Attending: Physical Medicine and Rehabilitation | Admitting: Physical Medicine and Rehabilitation

## 2011-02-25 ENCOUNTER — Other Ambulatory Visit: Payer: Self-pay | Admitting: Physical Medicine and Rehabilitation

## 2011-02-25 ENCOUNTER — Encounter (HOSPITAL_BASED_OUTPATIENT_CLINIC_OR_DEPARTMENT_OTHER): Payer: Medicare Other | Admitting: Physical Medicine and Rehabilitation

## 2011-02-25 DIAGNOSIS — M549 Dorsalgia, unspecified: Secondary | ICD-10-CM

## 2011-02-25 DIAGNOSIS — G894 Chronic pain syndrome: Secondary | ICD-10-CM

## 2011-02-25 DIAGNOSIS — M545 Low back pain, unspecified: Secondary | ICD-10-CM

## 2011-02-25 DIAGNOSIS — M47817 Spondylosis without myelopathy or radiculopathy, lumbosacral region: Secondary | ICD-10-CM | POA: Insufficient documentation

## 2011-02-25 DIAGNOSIS — Z981 Arthrodesis status: Secondary | ICD-10-CM | POA: Insufficient documentation

## 2011-02-26 NOTE — Assessment & Plan Note (Signed)
Brandon [name] is a very pleasant 77 year old gentleman who is accompanied by his wife today.  He was last seen by me on February 14, 2011.  At the time of his initial visit, he was essentially pain-free. He has since had an exacerbation of low back pain.  He is interested in trialing some pain medication to see if he can improve his general activity level.  Pain is worse in the morning and at night, it is worse with walking.  He typically uses a cane.  He is independent with self- care.  There are no changes in review of systems from previous visit on February 14, 2011.  PHYSICAL EXAMINATION:  Today, his blood pressure is 120/59, pulse 74, respirations 16, 94% saturated on room air.  He is a well-developed and well-nourished gentleman who appears his stated age and does not appear in any distress.  He is oriented x3.  His speech is clear.  Affect is bright.  He is alert, cooperative, and pleasant.  Follows commands without difficulty.  Answers my questions appropriately.  Cranial nerves and coordination are grossly intact.  His reflexes are diminished in the lower extremities.  He has relatively good strength in both upper and lower extremities.  No sensory deficits are appreciated.  Transitioning from sitting to standing is done with ease.  His gait is nonantalgic.  He has difficulty with tandem gait.  He has some sway with Romberg test.  Forward flexion does not exacerbate back pain significantly; however, extension bothers him a great deal.  IMPRESSION: 1. Lumbago, status post L3-4 fusion and decompression at L4-5 on July 22, 2008, Dr. Venetia Maxon. 2. Gait disorder.  PLAN:  Recommend physical therapy to address gait disorder.  I would like to start him on some Ultracet as well.  I have discussed the risks and benefits of this medication.  I have discussed other types of medication.  He is comfortable trialing this one given its side effect profile.  I have answered all his  questions, he is comfortable with this plan.  I will see him back in a month.     Brandon [name], M.D. Electronically Signed    DMK/MedQ D:  02/25/2011 14:39:06  T:  02/26/2011 01:55:59  Job #:  [id number]

## 2011-03-14 ENCOUNTER — Ambulatory Visit: Payer: Medicare Other | Admitting: Physical Medicine and Rehabilitation

## 2011-03-15 ENCOUNTER — Other Ambulatory Visit: Payer: Self-pay | Admitting: Internal Medicine

## 2011-03-15 ENCOUNTER — Other Ambulatory Visit (HOSPITAL_BASED_OUTPATIENT_CLINIC_OR_DEPARTMENT_OTHER): Payer: Medicare Other | Admitting: Lab

## 2011-03-15 ENCOUNTER — Ambulatory Visit: Payer: Medicare Other

## 2011-03-15 DIAGNOSIS — D638 Anemia in other chronic diseases classified elsewhere: Secondary | ICD-10-CM

## 2011-03-15 DIAGNOSIS — N189 Chronic kidney disease, unspecified: Secondary | ICD-10-CM

## 2011-03-15 LAB — CBC WITH DIFFERENTIAL/PLATELET
BASO%: 0.2 % (ref 0.0–2.0)
EOS%: 10.3 % — ABNORMAL HIGH (ref 0.0–7.0)
MCH: 32.2 pg (ref 27.2–33.4)
MCHC: 33.6 g/dL (ref 32.0–36.0)
MCV: 95.7 fL (ref 79.3–98.0)
MONO%: 14 % (ref 0.0–14.0)
RBC: 3.75 10*6/uL — ABNORMAL LOW (ref 4.20–5.82)
RDW: 14.2 % (ref 11.0–14.6)
lymph#: 1.8 10*3/uL (ref 0.9–3.3)

## 2011-03-15 MED ORDER — DARBEPOETIN ALFA-POLYSORBATE 500 MCG/ML IJ SOLN: 300.0000 ug | Freq: Once | INTRAMUSCULAR | Status: DC

## 2011-03-16 ENCOUNTER — Ambulatory Visit (INDEPENDENT_AMBULATORY_CARE_PROVIDER_SITE_OTHER): Payer: Medicare Other | Admitting: Endocrinology

## 2011-03-16 ENCOUNTER — Telehealth: Payer: Self-pay | Admitting: Internal Medicine

## 2011-03-16 ENCOUNTER — Encounter: Payer: Self-pay | Admitting: Endocrinology

## 2011-03-16 ENCOUNTER — Ambulatory Visit: Payer: Medicare Other | Admitting: Physical Medicine and Rehabilitation

## 2011-03-16 VITALS — BP 120/64 | HR 84 | Temp 98.1°F | Ht 69.0 in | Wt 155.0 lb

## 2011-03-16 DIAGNOSIS — J449 Chronic obstructive pulmonary disease, unspecified: Secondary | ICD-10-CM

## 2011-03-16 DIAGNOSIS — Z23 Encounter for immunization: Secondary | ICD-10-CM

## 2011-03-16 NOTE — Patient Instructions (Addendum)
please continue the aricept and losartan-hctz, but stay-off the symbicort and spiriva.   Please call with the name and strength of the anti-inflamatory drug.   Please come back for your regular physical after July 12, 2011.

## 2011-03-16 NOTE — Progress Notes (Signed)
Subjective:    Patient ID: Brandon [name], male    DOB: 11-14-1925, 77 y.o.   MRN: [id number]  HPI The state of at least three ongoing medical problems is addressed today: HTN: pt takes and tolerates meds well. Depression: Wife says much better on celexa. Pt agrees. Low-back pain: Pt saw dr Pamelia Hoit a few weeks ago. She prescribed an anit-inflamatory med, but wife does not know the name of it.  On this, pain is much better.   Dementia: Pt says the aricept gives him bad dreams.  Wife says memory is the same.  He wants to stay with it for now.   Dyspnea: pt says he doesn't need mdi's. Past Medical History  Diagnosis Date  . COPD (chronic obstructive pulmonary disease)   . Osteoporosis   . GERD (gastroesophageal reflux disease)   . Hypertension   . Anemia   . Arthritis   . Depression   . Cerebrovascular disease, unspecified 02/2008    Carotid U/S- right internal carotid 60-79%, left internal carotid artery 40-59%  . Secondary hyperparathyroidism (of renal origin)   . Other and unspecified hyperlipidemia   . Peripheral vascular disease, unspecified   . Atrial fibrillation   . Unspecified disorder resulting from impaired renal function   . Hiatal hernia   . Hyperkalemia     Due to ACE Inhibitors  . Varicosities     LE  . CVA (cerebral infarction)     Past Surgical History  Procedure Date  . Lumbar fusion 01/2008    History   Social History  . Marital Status: Married    Spouse Name: Erskine Squibb    Number of Children: Y  . Years of Education: N/A   Occupational History  . Art gallery manager     retired   Social History Main Topics  . Smoking status: Former Smoker -- 1.0 packs/day for 38 years    Types: Cigarettes    Quit date: 05/09/1982  . Smokeless tobacco: Not on file  . Alcohol Use: No  . Drug Use: No  . Sexually Active: Not on file   Other Topics Concern  . Not on file   Social History Narrative  . No narrative on file    Current Outpatient Prescriptions on File  Prior to Visit  Medication Sig Dispense Refill  . amLODipine (NORVASC) 5 MG tablet Take 1 tablet (5 mg total) by mouth daily.  30 tablet  11  . aspirin 81 MG tablet Take 81 mg by mouth daily.        . calcitRIOL (ROCALTROL) 0.25 MCG capsule Take 0.25 mcg by mouth daily.        . citalopram (CELEXA) 10 MG tablet Take 1 tablet (10 mg total) by mouth daily.  30 tablet  11  . donepezil (ARICEPT) 10 MG tablet Take 1 tablet (10 mg total) by mouth at bedtime as needed.  30 tablet  11  . losartan-hydrochlorothiazide (HYZAAR) 100-25 MG per tablet Take 1 tablet by mouth daily.  30 tablet  11  . Multiple Vitamin (MULTIVITAMIN) tablet Take 1 tablet by mouth daily.        Marland Kitchen omeprazole (PRILOSEC) 20 MG capsule TAKE 1 CAPSULE TWICE A DAY .. (TAKE 30 MINUTES BEFORE MEALS)  60 capsule  10  . OxyCODONE HCl, Abuse Deter, 5 MG TABS Take 1 tablet by mouth every 4 (four) hours as needed (for pain). 1-2 tablets every 4 hours as needed for pain  100 tablet  0  . rosuvastatin (CRESTOR) 20  MG tablet Take 20 mg by mouth daily.        . Tamsulosin HCl (FLOMAX) 0.4 MG CAPS Take 0.4 mg by mouth daily as needed.         No Known Allergies  Family History  Problem Relation Age of Onset  . Cancer Neg Hx     Colon Cancer  . Stroke Father   . Coronary artery disease Father   . Heart attack Father     BP 120/64  Pulse 84  Temp(Src) 98.1 F (36.7 C) (Oral)  Ht 5\' 9"  (1.753 m)  Wt 155 lb (70.308 kg)  BMI 22.89 kg/m2  SpO2 97%  Review of Systems Sob is mild.  He seldom take the symbicort.  No cough    Objective:   Physical Exam Vital signs: see vs page Gen: elderly, frail, no distress LUNGS:  Clear to auscultation      Assessment & Plan:  Htn, well-controlled. COPD, apparently improved.  He declines repeat spirometry.  Dementia.  No change Depression, better. Low-back pain, improved

## 2011-03-16 NOTE — Telephone Encounter (Signed)
Wife called to confirm appt on the 27th -she saw the word " oncology " and was confused  And saw the phlebotomist name and did not know who that was. I cleared up the confusion for her. She stated pt does not have an appt with Dr Shelle Iron on the 27th

## 2011-03-22 ENCOUNTER — Ambulatory Visit: Payer: Medicare Other | Admitting: Rehabilitative and Restorative Service Providers"

## 2011-03-24 ENCOUNTER — Telehealth: Payer: Self-pay | Admitting: Internal Medicine

## 2011-03-24 NOTE — Telephone Encounter (Signed)
S/w pt's wife, advised 12/4 md appt has been cx'd due to Epic. Advised her pt is to keep all other scheduled appts.

## 2011-03-28 ENCOUNTER — Encounter
Payer: Medicare Other | Attending: Physical Medicine and Rehabilitation | Admitting: Physical Medicine and Rehabilitation

## 2011-03-28 DIAGNOSIS — Z981 Arthrodesis status: Secondary | ICD-10-CM | POA: Insufficient documentation

## 2011-03-28 DIAGNOSIS — M545 Low back pain, unspecified: Secondary | ICD-10-CM | POA: Insufficient documentation

## 2011-03-28 DIAGNOSIS — M51379 Other intervertebral disc degeneration, lumbosacral region without mention of lumbar back pain or lower extremity pain: Secondary | ICD-10-CM | POA: Insufficient documentation

## 2011-03-28 DIAGNOSIS — M4120 Other idiopathic scoliosis, site unspecified: Secondary | ICD-10-CM

## 2011-03-28 DIAGNOSIS — M129 Arthropathy, unspecified: Secondary | ICD-10-CM | POA: Insufficient documentation

## 2011-03-28 DIAGNOSIS — M5137 Other intervertebral disc degeneration, lumbosacral region: Secondary | ICD-10-CM | POA: Insufficient documentation

## 2011-03-28 DIAGNOSIS — G894 Chronic pain syndrome: Secondary | ICD-10-CM

## 2011-03-28 DIAGNOSIS — R269 Unspecified abnormalities of gait and mobility: Secondary | ICD-10-CM | POA: Insufficient documentation

## 2011-03-28 NOTE — Assessment & Plan Note (Signed)
Brandon [name] is a very pleasant 77 year old gentleman who is accompanied by his wife today.  He was last seen by me on February 25, 2011.  He is status post an L3-4 fusion by Dr. Venetia Maxon July 22, 2008 and has also some disk space loss at L4-5 as well as facet arthropathy.  He reports his average pain between 8 and 10 on a scale of 10, however, there are times when he has absolutely no pain at all.  His pain is typically exacerbated with standing but sometimes sitting does aggravate him too.  Pain is described as sharp.  It interferes with his activities.  He can walk about 5 minutes at a time.  He can climb stairs.  He is driving.  He is independent with self-care.  REVIEW OF SYSTEMS:  Positive for occasional cough and trouble walking. No other change in past medical, social, or family history.  At the last visit, he was trialed on Ultracet.  He was taking 2-3 tablets a day and he tells me that the pain medication does seem to help him.  His wife indicates he believes it has given him some relief and neither of them feel it has made him too sleepy.  In fact she is requesting 1 extra pill a day to help him manage his pain.  PHYSICAL EXAMINATION:  Today, his blood pressure is 132/57, pulse 91, respirations 16, 96% saturated on room air.  He is a well-developed, well-nourished gentleman who does not appear in any distress.  He is oriented x3.  Speech is clear.  Affect is bright.  He is alert, cooperative, and pleasant.  Follows commands without difficulty. Answers my questions appropriately.  Cranial nerves, coordination are grossly intact.  His reflexes are diminished in the lower extremities. No abnormal tone, clonus, or tremors are noted.  Motor strength is relatively good in both upper and lower extremities.  He has some hip extensor weakness which is noted as he attempts to get out of a chair.  His gait is nonantalgic.  He does have some difficulty with tandem gait and he  does sway a little bit with Romberg test as well.  Forward flexion does not exacerbate his pain, however, extension bothers him.  IMPRESSION: 1. Lumbago status post L3-4 fusion and decompression, July 22, 2008     Dr. Venetia Maxon. 2. L4-5 facet arthropathy and degenerative disk disease at this level 3. Gait disorder.  PLAN:  We would like to get a better sense of his pain.  I have asked him to keep pain diary.  We will give him prescription for Ultracet up to 4 times a day.  I have written orders for physical therapy.  It was written last month, however, they did not pursue in getting him into therapy setting yet.  I would like him to work on lower extremity strength as well as a balance program.  After the holidays, they would like to consider possibly facet/medial branch and injections.  At this time, they are comfortable with our treatment plan.  Controlled substance agreement was signed as well today.  I have answered all their questions.     Brandon [name], M.D. Electronically Signed   DMK/MedQ D:  03/28/2011 15:12:46  T:  03/28/2011 21:55:51  Job #:  [id number]

## 2011-04-05 ENCOUNTER — Ambulatory Visit: Payer: Medicare Other

## 2011-04-05 ENCOUNTER — Ambulatory Visit: Payer: Medicare Other | Admitting: Pulmonary Disease

## 2011-04-05 ENCOUNTER — Other Ambulatory Visit: Payer: Medicare Other | Admitting: Lab

## 2011-04-10 ENCOUNTER — Telehealth: Payer: Self-pay | Admitting: Internal Medicine

## 2011-04-10 NOTE — Telephone Encounter (Signed)
S/w pt wife today re new f/u appt for 1/8 - moved from DEC due to EPIC. Wife will get jan schedule 12/18 when pt comes for lb/inj.

## 2011-04-15 ENCOUNTER — Other Ambulatory Visit: Payer: Self-pay | Admitting: Endocrinology

## 2011-04-26 ENCOUNTER — Other Ambulatory Visit: Payer: Self-pay | Admitting: Internal Medicine

## 2011-04-26 ENCOUNTER — Other Ambulatory Visit (HOSPITAL_BASED_OUTPATIENT_CLINIC_OR_DEPARTMENT_OTHER): Payer: Medicare Other | Admitting: Lab

## 2011-04-26 ENCOUNTER — Ambulatory Visit (HOSPITAL_BASED_OUTPATIENT_CLINIC_OR_DEPARTMENT_OTHER): Payer: Medicare Other

## 2011-04-26 ENCOUNTER — Telehealth: Payer: Self-pay | Admitting: Internal Medicine

## 2011-04-26 VITALS — BP 101/62 | HR 84 | Temp 97.7°F

## 2011-04-26 DIAGNOSIS — D509 Iron deficiency anemia, unspecified: Secondary | ICD-10-CM

## 2011-04-26 DIAGNOSIS — D638 Anemia in other chronic diseases classified elsewhere: Secondary | ICD-10-CM

## 2011-04-26 DIAGNOSIS — N189 Chronic kidney disease, unspecified: Secondary | ICD-10-CM

## 2011-04-26 LAB — CBC WITH DIFFERENTIAL/PLATELET
Basophils Absolute: 0 10*3/uL (ref 0.0–0.1)
Eosinophils Absolute: 0.8 10*3/uL — ABNORMAL HIGH (ref 0.0–0.5)
HCT: 31.8 % — ABNORMAL LOW (ref 38.4–49.9)
HGB: 10.9 g/dL — ABNORMAL LOW (ref 13.0–17.1)
MONO#: 1 10*3/uL — ABNORMAL HIGH (ref 0.1–0.9)
NEUT#: 3.1 10*3/uL (ref 1.5–6.5)
NEUT%: 43.7 % (ref 39.0–75.0)
RDW: 13.3 % (ref 11.0–14.6)
lymph#: 2.2 10*3/uL (ref 0.9–3.3)

## 2011-04-26 MED ORDER — DARBEPOETIN ALFA-POLYSORBATE 500 MCG/ML IJ SOLN
300.0000 ug | Freq: Once | INTRAMUSCULAR | Status: AC
  Administered 2011-04-26: 300 ug via SUBCUTANEOUS
  Filled 2011-04-26: qty 1

## 2011-04-26 NOTE — Telephone Encounter (Signed)
Talked to pt, gave him appt for 05/17/11, appt r/s from MD to [facility] same day, different time.

## 2011-04-27 ENCOUNTER — Encounter: Payer: Medicare Other | Admitting: Physical Medicine and Rehabilitation

## 2011-04-28 ENCOUNTER — Encounter
Payer: Medicare Other | Attending: Physical Medicine and Rehabilitation | Admitting: Physical Medicine and Rehabilitation

## 2011-04-29 NOTE — Assessment & Plan Note (Signed)
[name] is an very pleasant 77 year old gentleman who is accompanied by his wife who remains in the room with his consent.  He was last seen by me March 28, 2011.  He has a history of a lumbar fusion at L3-4 by Dr. Venetia [name] July 22, 2008.  He had, had some problems with sharp low back pain especially with movement.  At the last visit, he was placed on Ultracet.  He takes 3 pills throughout the day now and he reports a significant improvement in his pain.  It is no longer sharp.  It is somewhat dull.  It is about a 5 on a scale of 10.  It had been a 10 when he was first assessed in October of this year.  He sleeps well.  He reports good relief with current meds.  His pain is localized to the lumbar region bilaterally worse with walking, prolonged sitting or standing; improves with rest and medication.  FUNCTIONAL STATUS:  He can walk 6 minutes.  He is able to climb stairs. He is driving.  He is independent with self-care.  REVIEW OF SYSTEMS:  Noncontributory.  PAST MEDICA, SOCIAL AND FAMILY HISTORY:  Unchanged from previous visit.  EXAM:  Today; blood pressure 111/43, pulse 74, respirations 18, 97% saturated on room air.  He is 151 pounds and 69 inches tall.  He is a well-developed, well-nourished gentleman who appears his stated age and does not appear in any distress.  He is oriented x3.  Speech is clear. Affect is bright.  He is alert, cooperative and pleasant.  Follows commands without difficulty.  Answers my questions appropriately. Cranial nerves and coordination are grossly intact.  His reflexes are diminished in the lower extremities.  He reports no sensory deficits to light touch.  His motor strength is good in both lower extremities as well.  He transitions easily from sitting to standing.  He has a normal gait.  He has some difficulty with tandem gait, but is able to perform adequate Romberg test.  He has limitations in lumbar motion in all planes without pain  today.  IMPRESSION: 1. Lumbago, status post L3-4 fusion and decompression July 22, 2008,     Dr. Venetia [name]. 2. L4-5 facet arthropathy and degenerative disk disease at this level. 3. Mild balance disorder.  PLAN: 1. At the last visit, I had set him up for some physical therapy to     work on balance.  However, his orders were written, but he declined     participating in formal physical therapy program.  He finds the     Ultracet to be quite beneficial and helpful in managing his pain.     He has not had any problems with oversedation or constipation or     worsening of function with this medication in fact he finds it.  He     is doing much better on it. 2. He has been taking Ultracet up to 3 times a day.  He also is using     a generic heat wrap which he applies to his low back, which he gets     [facility].  He finds this to be quite effective as     well 3. At this point, he is doing very well.  He is not requesting further     intervention.  We had talked about medial branch blocks for     increased low back pain.  However, he declines this currently  as     well due to overall improvement in his pain scores.  I have     answered all their questions.  They are comfortable with this plan.     He is not interested in physical therapy for gait disorder     currently.  I will see him back in 3 months.    Brandon [name], M.D. Electronically Signed   DMK/MedQ D:  04/28/2011 13:11:24  T:  04/29/2011 08:18:05  Job #:  [id number]

## 2011-05-11 ENCOUNTER — Ambulatory Visit (INDEPENDENT_AMBULATORY_CARE_PROVIDER_SITE_OTHER)
Admission: RE | Admit: 2011-05-11 | Discharge: 2011-05-11 | Disposition: A | Discharge status: Home / Self Care | Payer: Medicare Other | Source: Ambulatory Visit | Attending: Internal Medicine | Admitting: Internal Medicine

## 2011-05-11 ENCOUNTER — Encounter: Payer: Self-pay | Admitting: Internal Medicine

## 2011-05-11 ENCOUNTER — Ambulatory Visit (INDEPENDENT_AMBULATORY_CARE_PROVIDER_SITE_OTHER): Payer: Medicare Other | Admitting: Internal Medicine

## 2011-05-11 VITALS — BP 120/72 | HR 89 | Temp 97.8°F | Ht 69.0 in | Wt 150.5 lb

## 2011-05-11 DIAGNOSIS — J209 Acute bronchitis, unspecified: Secondary | ICD-10-CM

## 2011-05-11 DIAGNOSIS — I10 Essential (primary) hypertension: Secondary | ICD-10-CM

## 2011-05-11 DIAGNOSIS — J449 Chronic obstructive pulmonary disease, unspecified: Secondary | ICD-10-CM

## 2011-05-11 MED ORDER — HYDROCODONE-HOMATROPINE 5-1.5 MG/5ML PO SYRP: 5.0000 mL | ORAL_SOLUTION | Freq: Four times a day (QID) | ORAL | Status: AC | PRN

## 2011-05-11 MED ORDER — LEVOFLOXACIN 250 MG PO TABS: 250.0000 mg | ORAL_TABLET | Freq: Every day | ORAL | Status: AC

## 2011-05-11 NOTE — Patient Instructions (Signed)
Take all new medications as prescribed - the antibiotic and cough medicine Continue all other medications as before Please go to XRAY in the Basement for the x-ray test Please call the phone number [phone] (the PhoneTree System) for results of testing in 2-3 days;  When calling, simply dial the number, and when prompted enter the MRN number above (the Medical Record Number) and the # key, then the message should start.

## 2011-05-11 NOTE — Assessment & Plan Note (Addendum)
Mild to mod, for antibx course,  to f/u any worsening symptoms or concerns, cant r/o pna - for cxr ,  to f/u any worsening symptoms or concerns

## 2011-05-11 NOTE — Progress Notes (Signed)
Subjective:    Patient ID: Brandon [name], male    DOB: 1925/05/18, 77 y.o.   MRN: [id number]  HPI  Here with acute onset mild to mod 2-3 days ST, HA, general weakness and malaise, with prod cough greenish sputum, but Pt denies chest pain, increased sob or doe, wheezing, orthopnea, PND, increased LE swelling, palpitations, dizziness or syncope.  Pt denies new neurological symptoms such as new headache, or facial or extremity weakness or numbness   Pt denies polydipsia, polyuria, or low sugar symptoms such as weakness or confusion improved with po intake.  Pt states overall good compliance with meds, trying to follow lower cholesterol, diabetic diet, wt overall stable but little exercise however.    Past Medical History  Diagnosis Date  . COPD (chronic obstructive pulmonary disease)   . Osteoporosis   . GERD (gastroesophageal reflux disease)   . Hypertension   . Anemia   . Arthritis   . Depression   . Cerebrovascular disease, unspecified 02/2008    Carotid U/S- right internal carotid 60-79%, left internal carotid artery 40-59%  . Secondary hyperparathyroidism (of renal origin)   . Other and unspecified hyperlipidemia   . Peripheral vascular disease, unspecified   . Campath-induced atrial fibrillation   . Unspecified disorder resulting from impaired renal function   . Hiatal hernia   . Hyperkalemia     Due to ACE Inhibitors  . Varicosities     LE  . CVA (cerebral infarction)    Past Surgical History  Procedure Date  . Lumbar fusion 01/2008    reports that he quit smoking about 29 years ago. His smoking use included Cigarettes. He has a 38 pack-year smoking history. He does not have any smokeless tobacco history on file. He reports that he does not drink alcohol or use illicit drugs. family history includes Coronary artery disease in his father; Heart attack in his father; and Stroke in his father.  There is no history of Cancer. No Known Allergies Current Outpatient Prescriptions on  File Prior to Visit  Medication Sig Dispense Refill  . amLODipine (NORVASC) 5 MG tablet Take 1 tablet (5 mg total) by mouth daily.  30 tablet  11  . aspirin 81 MG tablet Take 81 mg by mouth daily.        . calcitRIOL (ROCALTROL) 0.25 MCG capsule Take 0.25 mcg by mouth daily.        . citalopram (CELEXA) 10 MG tablet Take 1 tablet (10 mg total) by mouth daily.  30 tablet  11  . CRESTOR 20 MG tablet TAKE 1 TABLET EVERY DAY  30 tablet  10  . donepezil (ARICEPT) 10 MG tablet Take 1 tablet (10 mg total) by mouth at bedtime as needed.  30 tablet  11  . losartan-hydrochlorothiazide (HYZAAR) 100-25 MG per tablet Take 1 tablet by mouth daily.  30 tablet  11  . Multiple Vitamin (MULTIVITAMIN) tablet Take 1 tablet by mouth daily.        Marland Kitchen omeprazole (PRILOSEC) 20 MG capsule TAKE 1 CAPSULE TWICE A DAY .. (TAKE 30 MINUTES BEFORE MEALS)  60 capsule  10  . OxyCODONE HCl, Abuse Deter, 5 MG TABS Take 1 tablet by mouth every 4 (four) hours as needed (for pain). 1-2 tablets every 4 hours as needed for pain  100 tablet  0  . Tamsulosin HCl (FLOMAX) 0.4 MG CAPS Take 0.4 mg by mouth daily as needed.       . traMADol-acetaminophen (ULTRACET) 37.5-325 MG per tablet  1 tablet by mouth 2-3 times daily        Review of Systems Review of Systems  Constitutional: Negative for diaphoresis and unexpected weight change.  HENT: Negative for drooling and tinnitus.   Eyes: Negative for photophobia and visual disturbance.  Respiratory: Negative for choking and stridor.   Gastrointestinal: Negative for vomiting and blood in stool.  Genitourinary: Negative for hematuria and decreased urine volume.     Objective:   Physical Exam BP 120/72  Pulse 89  Temp(Src) 97.8 F (36.6 C) (Oral)  Ht 5\' 9"  (1.753 m)  Wt 150 lb 8 oz (68.266 kg)  BMI 22.22 kg/m2  SpO2 96% Physical Exam  VS noted, mild ill Constitutional: Pt appears well-developed and well-nourished.  HENT: Head: Normocephalic.  Right Ear: External ear normal.  Left  Ear: External ear normal.  Bilat tm's mild erythema.  Sinus nontender.  Pharynx mild erythema Eyes: Conjunctivae and EOM are normal. Pupils are equal, round, and reactive to light.  Neck: Normal range of motion. Neck supple.  Cardiovascular: Normal rate and regular rhythm.   Pulmonary/Chest: Effort normal and breath sounds normal.  Neurological: Pt is alert. No cranial nerve deficit.  Skin: Skin is warm. No erythema.  Psychiatric: Pt behavior is normal. Thought content normal.     Assessment & Plan:

## 2011-05-15 ENCOUNTER — Encounter: Payer: Self-pay | Admitting: Internal Medicine

## 2011-05-15 NOTE — Assessment & Plan Note (Signed)
stable overall by hx and exam, most recent data reviewed with pt, and pt to continue medical treatment as before  BP Readings from Last 3 Encounters:  05/11/11 120/72  04/26/11 101/62  03/16/11 120/64

## 2011-05-15 NOTE — Assessment & Plan Note (Signed)
stable overall by hx and exam, most recent data reviewed with pt, and pt to continue medical treatment as before  SpO2 Readings from Last 3 Encounters:  05/11/11 96%  03/16/11 97%  01/26/11 97%

## 2011-05-17 ENCOUNTER — Ambulatory Visit: Payer: Medicare Other | Admitting: Internal Medicine

## 2011-05-17 ENCOUNTER — Other Ambulatory Visit (HOSPITAL_BASED_OUTPATIENT_CLINIC_OR_DEPARTMENT_OTHER): Payer: Medicare Other | Admitting: Lab

## 2011-05-17 ENCOUNTER — Ambulatory Visit (HOSPITAL_BASED_OUTPATIENT_CLINIC_OR_DEPARTMENT_OTHER): Payer: Medicare Other | Admitting: Physician Assistant

## 2011-05-17 ENCOUNTER — Other Ambulatory Visit: Payer: Self-pay | Admitting: Internal Medicine

## 2011-05-17 ENCOUNTER — Other Ambulatory Visit: Payer: Medicare Other | Admitting: Lab

## 2011-05-17 ENCOUNTER — Encounter: Payer: Self-pay | Admitting: Physician Assistant

## 2011-05-17 DIAGNOSIS — D638 Anemia in other chronic diseases classified elsewhere: Secondary | ICD-10-CM

## 2011-05-17 DIAGNOSIS — N189 Chronic kidney disease, unspecified: Secondary | ICD-10-CM

## 2011-05-17 DIAGNOSIS — N039 Chronic nephritic syndrome with unspecified morphologic changes: Secondary | ICD-10-CM

## 2011-05-17 DIAGNOSIS — D509 Iron deficiency anemia, unspecified: Secondary | ICD-10-CM

## 2011-05-17 LAB — CBC WITH DIFFERENTIAL/PLATELET
BASO%: 0.3 % (ref 0.0–2.0)
Basophils Absolute: 0 10*3/uL (ref 0.0–0.1)
EOS%: 7.3 % — ABNORMAL HIGH (ref 0.0–7.0)
HGB: 11.9 g/dL — ABNORMAL LOW (ref 13.0–17.1)
MCH: 32.7 pg (ref 27.2–33.4)
MCHC: 33.6 g/dL (ref 32.0–36.0)
MCV: 97.3 fL (ref 79.3–98.0)
MONO%: 12.4 % (ref 0.0–14.0)
NEUT%: 45 % (ref 39.0–75.0)
RDW: 14.1 % (ref 11.0–14.6)

## 2011-05-17 LAB — COMPREHENSIVE METABOLIC PANEL
AST: 18 U/L (ref 0–37)
Alkaline Phosphatase: 55 U/L (ref 39–117)
BUN: 28 mg/dL — ABNORMAL HIGH (ref 6–23)
Creatinine, Ser: 1.84 mg/dL — ABNORMAL HIGH (ref 0.50–1.35)

## 2011-05-20 NOTE — Progress Notes (Signed)
No images are attached to the encounter. No scans are attached to the encounter. No scans are attached to the encounter. [facility] OFFICE PROGRESS NOTE  Romero Belling, MD, MD [address]th [address] [place] [address]  DIAGNOSIS: Anemia of chronic disease  PRIOR THERAPY: Status post treatment with Aranesp 300 mcg subcutaneously every 3 weeks discontinued secondary to insurance coverage issues  CURRENT THERAPY: 1. Integra Plus -1 capsule by mouth daily 2. Aranesp 300 mcg subcutaneously every 3 weeks  INTERVAL HISTORY: Brandon [name] 78 y.o. male returns for a regular office visit for followup of his anemia of chronic disease. Overall he is doing well in regards to his anemia of chronic disease. His main issue is that of back pain that has been present for approximately 2 years since he had surgery on his back. He voices no other specific complaints today. He had a CBC with differential as well as chemistries and LDH drawn today and is here to see whether he needs to continue with his Aranesp injection today.   MEDICAL HISTORY: Past Medical History  Diagnosis Date  . COPD (chronic obstructive pulmonary disease)   . Osteoporosis   . GERD (gastroesophageal reflux disease)   . Hypertension   . Anemia   . Arthritis   . Depression   . Cerebrovascular disease, unspecified 02/2008    Carotid U/S- right internal carotid 60-79%, left internal carotid artery 40-59%  . Secondary hyperparathyroidism (of renal origin)   . Other and unspecified hyperlipidemia   . Peripheral vascular disease, unspecified   . Atrial fibrillation   . Unspecified disorder resulting from impaired renal function   . Hiatal hernia   . Hyperkalemia     Due to ACE Inhibitors  . Varicosities     LE  . CVA (cerebral infarction)     ALLERGIES:   has no known allergies.  MEDICATIONS:  Current Outpatient Prescriptions  Medication Sig Dispense Refill  . amLODipine (NORVASC) 5 MG tablet Take 1  tablet (5 mg total) by mouth daily.  30 tablet  11  . aspirin 81 MG tablet Take 81 mg by mouth daily.        . calcitRIOL (ROCALTROL) 0.25 MCG capsule Take 0.25 mcg by mouth daily.        . citalopram (CELEXA) 10 MG tablet Take 1 tablet (10 mg total) by mouth daily.  30 tablet  11  . CRESTOR 20 MG tablet TAKE 1 TABLET EVERY DAY  30 tablet  10  . donepezil (ARICEPT) 10 MG tablet Take 1 tablet (10 mg total) by mouth at bedtime as needed.  30 tablet  11  . FeFum-FePoly-FA-B Cmp-C-Biot (INTEGRA PLUS PO) Take 1 capsule by mouth daily.        Marland Kitchen HYDROcodone-homatropine (HYCODAN) 5-1.5 MG/5ML syrup Take 5 mLs by mouth every 6 (six) hours as needed for cough.  120 mL  0  . losartan-hydrochlorothiazide (HYZAAR) 100-25 MG per tablet Take 1 tablet by mouth daily.  30 tablet  11  . Multiple Vitamin (MULTIVITAMIN) tablet Take 1 tablet by mouth daily.        Marland Kitchen omeprazole (PRILOSEC) 20 MG capsule TAKE 1 CAPSULE TWICE A DAY .. (TAKE 30 MINUTES BEFORE MEALS)  60 capsule  10  . OxyCODONE HCl, Abuse Deter, 5 MG TABS Take 1 tablet by mouth every 4 (four) hours as needed (for pain). 1-2 tablets every 4 hours as needed for pain  100 tablet  0  . Tamsulosin HCl (  FLOMAX) 0.4 MG CAPS Take 0.4 mg by mouth daily as needed.       . traMADol-acetaminophen (ULTRACET) 37.5-325 MG per tablet 1 tablet by mouth 2-3 times daily       . levofloxacin (LEVAQUIN) 250 MG tablet Take 1 tablet (250 mg total) by mouth daily.  7 tablet  0    SURGICAL HISTORY:  Past Surgical History  Procedure Date  . Lumbar fusion 01/2008    REVIEW OF SYSTEMS:  A comprehensive review of systems was negative except for: Constitutional: positive for fatigue Musculoskeletal: positive for back pain   PHYSICAL EXAMINATION: General appearance: alert, cooperative, appears stated age and no distress Head: Normocephalic, without obvious abnormality, atraumatic Neck: no adenopathy, no carotid bruit, no JVD, supple, symmetrical, trachea midline and thyroid not  enlarged, symmetric, no tenderness/mass/nodules Lymph nodes: Cervical, supraclavicular, and axillary nodes normal. Resp: clear to auscultation bilaterally Cardio: regular rate and rhythm, S1, S2 normal, no murmur, click, rub or gallop GI: soft, non-tender; bowel sounds normal; no masses,  no organomegaly Extremities: extremities normal, atraumatic, no cyanosis or edema Neurologic: Grossly normal  ECOG PERFORMANCE STATUS: 1 - Symptomatic but completely ambulatory  Blood pressure 134/63, pulse 68, temperature 97.1 F (36.2 C), temperature source Oral, height 5\' 9"  (1.753 m), weight 151 lb 6.4 oz (68.675 kg).  LABORATORY DATA: Lab Results  Component Value Date   WBC 5.3 05/17/2011   HGB 11.9* 05/17/2011   HCT 35.3* 05/17/2011   MCV 97.3 05/17/2011   PLT 82* 05/17/2011      Chemistry      Component Value Date/Time   NA 140 05/17/2011 1426   K 4.7 05/17/2011 1426   CL 109 05/17/2011 1426   CO2 19 05/17/2011 1426   BUN 28* 05/17/2011 1426   CREATININE 1.84* 05/17/2011 1426      Component Value Date/Time   CALCIUM 8.6 05/17/2011 1426   CALCIUM 9.2 07/12/2010 2254   ALKPHOS 55 05/17/2011 1426   AST 18 05/17/2011 1426   ALT 10 05/17/2011 1426   BILITOT 0.3 05/17/2011 1426       RADIOGRAPHIC STUDIES:  Dg Chest 2 View  05/11/2011  *RADIOLOGY REPORT*  Clinical Data: Productive cough, fever, history of dementia  CHEST - 2 VIEW  Comparison: 02/01/2011; 12/06/2010; 05/27/2010  Findings:  Grossly unchanged cardiac silhouette and mediastinal contours with atherosclerotic calcifications within the thoracic aorta.  The lungs are hyperinflated.  There is grossly unchanged bilateral linear perihilar heterogeneous opacities, right greater than left, favored to represent atelectasis or scar.  No new focal airspace opacities.  Grossly unchanged minimally asymmetric biapical pleural parenchymal thickening, right greater than left.  No definite pleural effusion or pneumothorax.  Unchanged bones.  IMPRESSION: Hyperexpanded lungs  without definite acute cardiopulmonary disease.  Original Report Authenticated By: Waynard Reeds, M.D.     ASSESSMENT/PLAN: This is a very pleasant 78 year old white male with a history of anemia of chronic disease now being treated with Integra plus and Aranesp injections. His hemoglobin today is 11.9 and therefore will not qualify to receive his Aranesp injection today. The patient was discussed with Dr.Livesay in Dr. Asa Lente absence. His chemistries and LDH are pending from today. He will continue with his every three-week CBC differential and Aranesp injections as ordered as long as he falls within parameters to receive his injections. He'll followup with Dr. Arbutus Ped in 3 months for a followup office visit with a repeat CBC differential C. met and LDH.     Conni Slipper, PA-C  All questions were answered. The patient knows to call the clinic with any problems, questions or concerns. We can certainly see the patient much sooner if necessary.

## 2011-05-23 ENCOUNTER — Other Ambulatory Visit: Payer: Self-pay | Admitting: Internal Medicine

## 2011-05-23 MED ORDER — ALPRAZOLAM 0.25 MG PO TABS: 0.2500 mg | ORAL_TABLET | Freq: Every evening | ORAL | Status: DC | PRN

## 2011-05-23 NOTE — Telephone Encounter (Signed)
I defer to PCP - dr Everardo All

## 2011-05-23 NOTE — Telephone Encounter (Signed)
Addended by: Corwin Levins on: 05/23/2011 05:01 PM   Modules accepted: Orders

## 2011-05-23 NOTE — Telephone Encounter (Signed)
Addended by: Anselm Jungling on: 05/23/2011 05:00 PM   Modules accepted: Orders

## 2011-05-23 NOTE — Telephone Encounter (Signed)
Rx for Alprazolam last written 09/24/2010 #50 with 0 refills-please advise.

## 2011-05-23 NOTE — Telephone Encounter (Signed)
Rx faxed to pharmacy. Pt advised via VM

## 2011-05-24 ENCOUNTER — Other Ambulatory Visit: Payer: Self-pay

## 2011-05-24 MED ORDER — TIOTROPIUM BROMIDE MONOHYDRATE 18 MCG IN CAPS: 18.0000 ug | ORAL_CAPSULE | Freq: Every day | RESPIRATORY_TRACT | Status: AC

## 2011-05-24 NOTE — Telephone Encounter (Signed)
Rx sent to [facility], pt's spouse informed.  

## 2011-05-24 NOTE — Telephone Encounter (Signed)
Yes, please refill prn  

## 2011-05-24 NOTE — Telephone Encounter (Signed)
Pt's spouse called requesting refill of Spiriva stating that pt has re-started medication 2 weeks ago. Okay to refill?

## 2011-05-30 ENCOUNTER — Telehealth: Payer: Self-pay

## 2011-05-30 NOTE — Telephone Encounter (Signed)
Pt's spouse called to inform MD that pt is taking 2 Xanax/day and his appetite has improved as well as his general personality. Spouse wanted MD to be aware of this in regards to refills.

## 2011-05-30 NOTE — Telephone Encounter (Signed)
i changed med list

## 2011-06-07 ENCOUNTER — Ambulatory Visit: Payer: Medicare Other

## 2011-06-07 ENCOUNTER — Other Ambulatory Visit (HOSPITAL_BASED_OUTPATIENT_CLINIC_OR_DEPARTMENT_OTHER): Payer: Medicare Other

## 2011-06-07 DIAGNOSIS — D638 Anemia in other chronic diseases classified elsewhere: Secondary | ICD-10-CM

## 2011-06-07 LAB — CBC WITH DIFFERENTIAL/PLATELET
BASO%: 0.2 % (ref 0.0–2.0)
Basophils Absolute: 0 10*3/uL (ref 0.0–0.1)
EOS%: 9.9 % — ABNORMAL HIGH (ref 0.0–7.0)
MCH: 32.8 pg (ref 27.2–33.4)
MCHC: 34.3 g/dL (ref 32.0–36.0)
MCV: 95.5 fL (ref 79.3–98.0)
MONO%: 15.1 % — ABNORMAL HIGH (ref 0.0–14.0)
RBC: 3.49 10*6/uL — ABNORMAL LOW (ref 4.20–5.82)
RDW: 13.5 % (ref 11.0–14.6)
lymph#: 2 10*3/uL (ref 0.9–3.3)

## 2011-06-07 MED ORDER — DARBEPOETIN ALFA-POLYSORBATE 500 MCG/ML IJ SOLN: 300.0000 ug | Freq: Once | INTRAMUSCULAR | Status: DC

## 2011-06-28 ENCOUNTER — Other Ambulatory Visit (HOSPITAL_BASED_OUTPATIENT_CLINIC_OR_DEPARTMENT_OTHER): Payer: Medicare Other | Admitting: Lab

## 2011-06-28 ENCOUNTER — Ambulatory Visit: Payer: Medicare Other

## 2011-06-28 DIAGNOSIS — D638 Anemia in other chronic diseases classified elsewhere: Secondary | ICD-10-CM

## 2011-06-28 LAB — CBC WITH DIFFERENTIAL/PLATELET
BASO%: 0.2 % (ref 0.0–2.0)
Basophils Absolute: 0 10*3/uL (ref 0.0–0.1)
EOS%: 11.1 % — ABNORMAL HIGH (ref 0.0–7.0)
Eosinophils Absolute: 0.8 10*3/uL — ABNORMAL HIGH (ref 0.0–0.5)
HCT: 33.3 % — ABNORMAL LOW (ref 38.4–49.9)
HGB: 11.2 g/dL — ABNORMAL LOW (ref 13.0–17.1)
LYMPH%: 29.6 % (ref 14.0–49.0)
MCH: 32 pg (ref 27.2–33.4)
MCHC: 33.5 g/dL (ref 32.0–36.0)
MCV: 95.4 fL (ref 79.3–98.0)
MONO#: 1.2 10*3/uL — ABNORMAL HIGH (ref 0.1–0.9)
MONO%: 15.4 % — ABNORMAL HIGH (ref 0.0–14.0)
NEUT#: 3.3 10*3/uL (ref 1.5–6.5)
NEUT%: 43.7 % (ref 39.0–75.0)
Platelets: 79 10*3/uL — ABNORMAL LOW (ref 140–400)
RBC: 3.49 10*6/uL — ABNORMAL LOW (ref 4.20–5.82)
RDW: 13.1 % (ref 11.0–14.6)
WBC: 7.6 10*3/uL (ref 4.0–10.3)
lymph#: 2.2 10*3/uL (ref 0.9–3.3)

## 2011-06-28 NOTE — Progress Notes (Signed)
Pt entered  Center today for labs and possible  Injection of Aranesp. Lab Hemoglobin of 11.2 with a  Hematocrit  Of  33.3 noted. Injection held per orders.  Pt instructed to keep scheduled appointments and to call if issues occur. Pt verbalized understanding of instructions.

## 2011-07-01 ENCOUNTER — Ambulatory Visit: Payer: Medicare Other | Admitting: Endocrinology

## 2011-07-07 ENCOUNTER — Ambulatory Visit: Payer: Medicare Other | Admitting: Endocrinology

## 2011-07-08 ENCOUNTER — Telehealth: Payer: Self-pay | Admitting: Endocrinology

## 2011-07-08 MED ORDER — ALPRAZOLAM 0.5 MG PO TABS: 0.5000 mg | ORAL_TABLET | Freq: Every evening | ORAL | Status: DC | PRN

## 2011-07-08 NOTE — Telephone Encounter (Signed)
The pt called and is hoping to get a refill of xanax .5mg  [facility]  Thanks!

## 2011-07-08 NOTE — Telephone Encounter (Signed)
Faxed hardcopy to pharmacy and informed the patient 

## 2011-07-08 NOTE — Telephone Encounter (Signed)
Done hardcopy to robin  

## 2011-07-08 NOTE — Telephone Encounter (Signed)
Last written 05/23/2011 #50 with 0 refills by Dr. Gearldine Shown advise in his absence-thanks!

## 2011-07-15 ENCOUNTER — Encounter
Payer: Medicare Other | Attending: Physical Medicine and Rehabilitation | Admitting: Physical Medicine and Rehabilitation

## 2011-07-15 ENCOUNTER — Encounter: Payer: Self-pay | Admitting: Physical Medicine and Rehabilitation

## 2011-07-15 DIAGNOSIS — R29818 Other symptoms and signs involving the nervous system: Secondary | ICD-10-CM

## 2011-07-15 DIAGNOSIS — R2689 Other abnormalities of gait and mobility: Secondary | ICD-10-CM | POA: Insufficient documentation

## 2011-07-15 DIAGNOSIS — M545 Low back pain, unspecified: Secondary | ICD-10-CM | POA: Insufficient documentation

## 2011-07-15 NOTE — Progress Notes (Signed)
Subjective:    Patient ID: Brandon [name], male    DOB: 1925-07-31, 78 y.o.   MRN: [id number]  HPI  The patient is a 78 year old woman who was accompanied by his wife who remains in the room with his consent. He was seen last by me 04/28/2011. He has a history of a lumbar fusion at L3-4 by a doctor [facility] 07/22/2008. His chief complaint is chronic low back pain which is worse with movement. He's been using Ultracet over the last several months 2-3 pills per day on the average. This has improved his pain as about 5 or 6 on a scale of 10 when first assessed it was a 10 on a scale of 10 back in October.  Overall he's been sleeping well he gets relatively good relief with the current medication.  Pain is typically located in the low back and he comes and goes. Typically worse with walking and standing. Problems of bowel or bladder new numbness or tingling or weakness.   Pain Inventory Average Pain 9 Pain Right Now 7 My pain is constant, dull and aching  In the last 24 hours, has pain interfered with the following? General activity 5 Relation with others 9 Enjoyment of life 5 What TIME of day is your pain at its worst? morning Sleep (in general) Good  Pain is worse with: walking and standing Pain improves with: rest, heat/ice and medication Relief from Meds: 4  Mobility use a cane ability to climb steps?  yes do you drive?  yes  Function retired  Neuro/Psych depression  Prior Studies x-rays  Physicians involved in your care Primary care Dr Romero Belling      Review of Systems  Constitutional: Negative.   HENT: Negative.   Eyes: Negative.   Respiratory: Positive for shortness of breath.   Cardiovascular: Negative.   Gastrointestinal: Negative.   Genitourinary: Negative.   Musculoskeletal: Positive for arthralgias.  Skin: Negative.   Neurological: Negative.   Hematological: Negative.   Psychiatric/Behavioral: Negative.        Objective:   Physical Exam He  is alert oriented. He answers questions appropriately and followed commands without difficulty. Cranial nerves are grossly intact although he may have some hearing deficit.  Coordination is grossly intact  Lower extremity strength is generally 5 over 5 and hip flexors and extensors dorsiflexors plantar flexors and knee flexors area  Under sensory deficits are noted straight leg raise is negative no abnormal tone clonus or tremors are noted.  Reflexes are diminished in both lower extremities.  Patient transitions from sit to stand using slight push up with upper extremities suggesting some mild hip extensor weakness. His gait is normal.  He is a mild difficulty with tandem gait and there is a little bit of swaying noticed with Romberg test.   Lumbar motion is diminished especially in flexion and he has a flattened back and limited extension as well. There is no tenderness to palpation in the lumbar spine her paraspinal musculature.       Assessment & Plan:  1. Lumbago, status post L3-4 fusion and decompression 07/22/2008 Dr. Venetia Maxon.  2. L4-5 facet arthropathy and degenerative disc disease.  3. Mild balance disorder.  Plan: Today we have discussed various treatment options to help manage the patient's low back pain. We have discussed injections, medications, modalities, heat wraps, lumbar profile Velcro brace.  The patient and his wife would like to pursue consideration of bracing as well as continue medications and the use of heat wraps.  They understand that there is a possible interaction of the ultraset with the antidepressant and will continue to minimize use of ultrasound.  Minor changes are being made to the scheduling of this medication. He will take first dose at 10 AM when he wakes up second at 3 PM and third at 8 PM. He goes to bed tonight typically  Prescription today for lumbar low profile brace with Velcro closure. The patient does not need a refill on his ultracet  today.

## 2011-07-15 NOTE — Patient Instructions (Addendum)
Take ultacet at 10 AM after you wake up in the morning, at 3 PM and at 8 PM. Order for back brace given today  Followed back up in our clinic in 4 months.  I've encouraged him to walk on a daily basis 2-3 times a day at least for 15 minutes using a cane to prevent falls.

## 2011-07-18 ENCOUNTER — Encounter: Payer: Self-pay | Admitting: Endocrinology

## 2011-07-18 ENCOUNTER — Ambulatory Visit (INDEPENDENT_AMBULATORY_CARE_PROVIDER_SITE_OTHER): Payer: Medicare Other | Admitting: Endocrinology

## 2011-07-18 ENCOUNTER — Other Ambulatory Visit (INDEPENDENT_AMBULATORY_CARE_PROVIDER_SITE_OTHER): Payer: Medicare Other

## 2011-07-18 ENCOUNTER — Ambulatory Visit (HOSPITAL_BASED_OUTPATIENT_CLINIC_OR_DEPARTMENT_OTHER): Payer: Medicare Other

## 2011-07-18 ENCOUNTER — Other Ambulatory Visit: Payer: Self-pay | Admitting: Medical Oncology

## 2011-07-18 VITALS — BP 120/52 | HR 70 | Temp 97.8°F | Ht 69.0 in | Wt 155.2 lb

## 2011-07-18 DIAGNOSIS — D638 Anemia in other chronic diseases classified elsewhere: Secondary | ICD-10-CM

## 2011-07-18 DIAGNOSIS — N189 Chronic kidney disease, unspecified: Secondary | ICD-10-CM

## 2011-07-18 DIAGNOSIS — R7309 Other abnormal glucose: Secondary | ICD-10-CM

## 2011-07-18 DIAGNOSIS — N2581 Secondary hyperparathyroidism of renal origin: Secondary | ICD-10-CM

## 2011-07-18 DIAGNOSIS — E079 Disorder of thyroid, unspecified: Secondary | ICD-10-CM

## 2011-07-18 DIAGNOSIS — Z79899 Other long term (current) drug therapy: Secondary | ICD-10-CM

## 2011-07-18 DIAGNOSIS — E875 Hyperkalemia: Secondary | ICD-10-CM

## 2011-07-18 DIAGNOSIS — N259 Disorder resulting from impaired renal tubular function, unspecified: Secondary | ICD-10-CM

## 2011-07-18 DIAGNOSIS — Z125 Encounter for screening for malignant neoplasm of prostate: Secondary | ICD-10-CM

## 2011-07-18 DIAGNOSIS — E785 Hyperlipidemia, unspecified: Secondary | ICD-10-CM

## 2011-07-18 DIAGNOSIS — D509 Iron deficiency anemia, unspecified: Secondary | ICD-10-CM

## 2011-07-18 LAB — CBC WITH DIFFERENTIAL/PLATELET
Basophils Absolute: 0 10*3/uL (ref 0.0–0.1)
Eosinophils Absolute: 0.7 10*3/uL — ABNORMAL HIGH (ref 0.0–0.5)
HCT: 30 % — ABNORMAL LOW (ref 38.4–49.9)
HGB: 10 g/dL — ABNORMAL LOW (ref 13.0–17.1)
MCH: 31.2 pg (ref 27.2–33.4)
MCV: 93.5 fL (ref 79.3–98.0)
MONO%: 18.5 % — ABNORMAL HIGH (ref 0.0–14.0)
NEUT#: 4.9 10*3/uL (ref 1.5–6.5)
NEUT%: 50.1 % (ref 39.0–75.0)
Platelets: 88 10*3/uL — ABNORMAL LOW (ref 140–400)
RDW: 12.6 % (ref 11.0–14.6)

## 2011-07-18 LAB — URINALYSIS, ROUTINE W REFLEX MICROSCOPIC
Bilirubin Urine: NEGATIVE
Hgb urine dipstick: NEGATIVE
Ketones, ur: NEGATIVE
Total Protein, Urine: 30
Urine Glucose: NEGATIVE
Urobilinogen, UA: 0.2 (ref 0.0–1.0)

## 2011-07-18 LAB — BASIC METABOLIC PANEL
BUN: 44 mg/dL — ABNORMAL HIGH (ref 6–23)
Chloride: 108 mEq/L (ref 96–112)
GFR: 35.03 mL/min — ABNORMAL LOW (ref >60.00)
Potassium: 4.7 mEq/L (ref 3.5–5.1)
Sodium: 137 mEq/L (ref 135–145)

## 2011-07-18 LAB — LIPID PANEL
Cholesterol: 140 mg/dL (ref 0–200)
HDL: 44.1 mg/dL (ref >39.00)
LDL Cholesterol: 69 mg/dL (ref 0–99)
Triglycerides: 134 mg/dL (ref 0.0–149.0)
VLDL: 26.8 mg/dL (ref 0.0–40.0)

## 2011-07-18 LAB — HEPATIC FUNCTION PANEL
Albumin: 3.8 g/dL (ref 3.5–5.2)
Total Protein: 6.4 g/dL (ref 6.0–8.3)

## 2011-07-18 LAB — PSA: PSA: 0.88 ng/mL (ref 0.10–4.00)

## 2011-07-18 LAB — HEMOGLOBIN A1C: Hgb A1c MFr Bld: 5.7 % (ref 4.6–6.5)

## 2011-07-18 MED ORDER — DARBEPOETIN ALFA-POLYSORBATE 300 MCG/0.6ML IJ SOLN
300.0000 ug | Freq: Once | INTRAMUSCULAR | Status: AC
  Administered 2011-07-18: 300 ug via SUBCUTANEOUS
  Filled 2011-07-18: qty 1

## 2011-07-18 MED ORDER — CITALOPRAM HYDROBROMIDE 20 MG PO TABS: 20.0000 mg | ORAL_TABLET | Freq: Every day | ORAL | Status: DC

## 2011-07-18 NOTE — Patient Instructions (Addendum)
Increase citalopram to 20 mg daily Try to minimize the alprazolam. Your dosage is 0.25 mg. it is critically important to prevent falling down (keep floor areas well-lit, dry, and free of loose objects.  If you have a cane, walker, or wheelchair, you should use it, even for short trips around the house.  Also, try not to rush) blood tests are being requested for you today.  please call [phone] to hear your test results.  You will be prompted to enter the 9-digit "MRN" number that appears at the top left of this page, followed by #.  Then you will hear the message. Please schedule a regular physical soon.   (update: i left message on phone-tree:  rx as we discussed)

## 2011-07-18 NOTE — Progress Notes (Signed)
Subjective:    Patient ID: Brandon [name], male    DOB: 02-03-1926, 78 y.o.   MRN: [id number]  HPI The state of at least three ongoing medical problems is addressed today: Dyslipidemia: denies chest pain Pt says xanax helps him sleep well, but he still has depression.   Renal insuff: denies sob. Past Medical History  Diagnosis Date  . COPD (chronic obstructive pulmonary disease)   . Osteoporosis   . GERD (gastroesophageal reflux disease)   . Hypertension   . Anemia   . Arthritis   . Depression   . Cerebrovascular disease, unspecified 02/2008    Carotid U/S- right internal carotid 60-79%, left internal carotid artery 40-59%  . Secondary hyperparathyroidism (of renal origin)   . Other and unspecified hyperlipidemia   . Peripheral vascular disease, unspecified   . Atrial fibrillation   . Unspecified disorder resulting from impaired renal function   . Hiatal hernia   . Hyperkalemia     Due to ACE Inhibitors  . Varicosities     LE  . CVA (cerebral infarction)   . Lumbago   . Lumbosacral spondylosis without myelopathy   . Chronic pain syndrome     Past Surgical History  Procedure Date  . Lumbar fusion 01/2008    History   Social History  . Marital Status: Married    Spouse Name: Erskine Squibb    Number of Children: Y  . Years of Education: N/A   Occupational History  . Art gallery manager     retired   Social History Main Topics  . Smoking status: Former Smoker -- 1.0 packs/day for 38 years    Types: Cigarettes    Quit date: 05/09/1982  . Smokeless tobacco: Not on file  . Alcohol Use: No  . Drug Use: No  . Sexually Active: Not on file   Other Topics Concern  . Not on file   Social History Narrative  . No narrative on file    Current Outpatient Prescriptions on File Prior to Visit  Medication Sig Dispense Refill  . amLODipine (NORVASC) 5 MG tablet Take 1 tablet (5 mg total) by mouth daily.  30 tablet  11  . aspirin 81 MG tablet Take 81 mg by mouth daily.        .  calcitRIOL (ROCALTROL) 0.25 MCG capsule Take 0.25 mcg by mouth daily.        Marland Kitchen FeFum-FePoly-FA-B Cmp-C-Biot (INTEGRA PLUS PO) Take 1 capsule by mouth daily.        Marland Kitchen losartan-hydrochlorothiazide (HYZAAR) 100-25 MG per tablet Take 1 tablet by mouth daily.  30 tablet  11  . Multiple Vitamin (MULTIVITAMIN) tablet Take 1 tablet by mouth daily.        Marland Kitchen omeprazole (PRILOSEC) 20 MG capsule TAKE 1 CAPSULE TWICE A DAY .. (TAKE 30 MINUTES BEFORE MEALS)  60 capsule  10  . tiotropium (SPIRIVA) 18 MCG inhalation capsule Place 1 capsule (18 mcg total) into inhaler and inhale daily.  30 capsule  5  . traMADol-acetaminophen (ULTRACET) 37.5-325 MG per tablet 1 tablet by mouth 3-4 times daily as needed      . OxyCODONE HCl, Abuse Deter, 5 MG TABS Take 1 tablet by mouth every 4 (four) hours as needed (for pain). 1-2 tablets every 4 hours as needed for pain  100 tablet  0    No Known Allergies  Family History  Problem Relation Age of Onset  . Cancer Neg Hx     Colon Cancer  . Stroke Father   .  Coronary artery disease Father   . Heart attack Father     BP 120/52  Pulse 70  Temp(Src) 97.8 F (36.6 C) (Oral)  Ht 5\' 9"  (1.753 m)  Wt 155 lb 3.2 oz (70.398 kg)  BMI 22.92 kg/m2  SpO2 95%   Review of Systems Appetite is improved.  Denies falls.      Objective:   Physical Exam VITAL SIGNS:  See vs page GENERAL: no distress Gait: slow but steady   Lab Results  Component Value Date   WBC 9.7 07/18/2011   HGB 10.0* 07/18/2011   HCT 30.0* 07/18/2011   PLT 88* 07/18/2011   GLUCOSE 96 07/18/2011   CHOL 140 07/18/2011   TRIG 134.0 07/18/2011   HDL 44.10 07/18/2011   LDLDIRECT 86.4 07/12/2010   LDLCALC 69 07/18/2011   ALT 17 07/18/2011   AST 18 07/18/2011   NA 137 07/18/2011   K 4.7 07/18/2011   CL 108 07/18/2011   CREATININE 2.0* 07/18/2011   BUN 44* 07/18/2011   CO2 22 07/18/2011   TSH 0.63 07/18/2011   PSA 0.88 07/18/2011   HGBA1C 5.7 07/18/2011      Assessment & Plan:  Renal insuff, stable Depression,  needs increased rx Dyslipidemia, well-controlled

## 2011-07-19 ENCOUNTER — Telehealth: Payer: Self-pay

## 2011-07-19 ENCOUNTER — Ambulatory Visit: Payer: Medicare Other

## 2011-07-19 ENCOUNTER — Other Ambulatory Visit: Payer: Medicare Other

## 2011-07-19 LAB — PTH, INTACT AND CALCIUM
Calcium, Total (PTH): 9.2 mg/dL (ref 8.4–10.5)
PTH: 171.5 pg/mL — ABNORMAL HIGH (ref 14.0–72.0)

## 2011-07-19 MED ORDER — ATORVASTATIN CALCIUM 80 MG PO TABS: 80.0000 mg | ORAL_TABLET | Freq: Every day | ORAL | Status: DC

## 2011-07-19 NOTE — Telephone Encounter (Signed)
i sent rx 

## 2011-07-19 NOTE — Telephone Encounter (Signed)
Pt spouse called requesting MD changed pt's current cholesterol medication from Crestor to a cheaper alternative due to cost.

## 2011-07-19 NOTE — Telephone Encounter (Signed)
Left message for pt to callback office.  

## 2011-07-19 NOTE — Telephone Encounter (Signed)
Pt's spouse informed of new cholesterol rx.

## 2011-07-22 ENCOUNTER — Other Ambulatory Visit: Payer: Self-pay | Admitting: Endocrinology

## 2011-07-25 ENCOUNTER — Ambulatory Visit: Payer: Medicare Other | Admitting: Physical Medicine and Rehabilitation

## 2011-08-03 ENCOUNTER — Other Ambulatory Visit: Payer: Self-pay | Admitting: *Deleted

## 2011-08-03 MED ORDER — CALCITRIOL 0.25 MCG PO CAPS: ORAL_CAPSULE | ORAL | Status: DC

## 2011-08-03 MED ORDER — AMLODIPINE BESYLATE 5 MG PO TABS: 5.0000 mg | ORAL_TABLET | Freq: Every day | ORAL | Status: DC

## 2011-08-03 MED ORDER — LOSARTAN POTASSIUM-HCTZ 100-25 MG PO TABS: 1.0000 | ORAL_TABLET | Freq: Every day | ORAL | Status: DC

## 2011-08-03 MED ORDER — ATORVASTATIN CALCIUM 80 MG PO TABS: 80.0000 mg | ORAL_TABLET | Freq: Every day | ORAL | Status: DC

## 2011-08-03 MED ORDER — CITALOPRAM HYDROBROMIDE 20 MG PO TABS: 20.0000 mg | ORAL_TABLET | Freq: Every day | ORAL | Status: AC

## 2011-08-03 NOTE — Telephone Encounter (Signed)
Pt's spouse called on behalf of pt. Pt is now switching to mail order pharmacies, and will need rx sent to [facility].

## 2011-08-10 ENCOUNTER — Encounter: Payer: Self-pay | Admitting: Physical Medicine and Rehabilitation

## 2011-08-10 ENCOUNTER — Other Ambulatory Visit: Payer: Self-pay | Admitting: *Deleted

## 2011-08-15 ENCOUNTER — Ambulatory Visit: Payer: Medicare Other

## 2011-08-15 ENCOUNTER — Telehealth: Payer: Self-pay | Admitting: Internal Medicine

## 2011-08-15 ENCOUNTER — Other Ambulatory Visit: Payer: Medicare Other

## 2011-08-15 ENCOUNTER — Ambulatory Visit: Payer: Medicare Other | Admitting: Internal Medicine

## 2011-08-15 NOTE — Telephone Encounter (Signed)
wife called to cx todays appt as he is having a lot of back pain and seeing pain management today and will c/b to r/s appt   aom

## 2011-08-16 ENCOUNTER — Other Ambulatory Visit: Payer: Self-pay

## 2011-08-16 ENCOUNTER — Ambulatory Visit: Payer: Medicare Other | Admitting: Endocrinology

## 2011-08-16 ENCOUNTER — Encounter (HOSPITAL_COMMUNITY): Payer: Self-pay

## 2011-08-16 ENCOUNTER — Emergency Department (HOSPITAL_COMMUNITY)
Admission: EM | Admit: 2011-08-16 | Discharge: 2011-08-16 | Disposition: A | Discharge status: Home / Self Care | Payer: Medicare Other | Source: Home / Self Care | Attending: Emergency Medicine | Admitting: Emergency Medicine

## 2011-08-16 DIAGNOSIS — I10 Essential (primary) hypertension: Secondary | ICD-10-CM | POA: Insufficient documentation

## 2011-08-16 DIAGNOSIS — R5381 Other malaise: Secondary | ICD-10-CM | POA: Insufficient documentation

## 2011-08-16 DIAGNOSIS — N289 Disorder of kidney and ureter, unspecified: Secondary | ICD-10-CM | POA: Insufficient documentation

## 2011-08-16 DIAGNOSIS — R197 Diarrhea, unspecified: Secondary | ICD-10-CM

## 2011-08-16 DIAGNOSIS — I959 Hypotension, unspecified: Secondary | ICD-10-CM | POA: Insufficient documentation

## 2011-08-16 DIAGNOSIS — Z87891 Personal history of nicotine dependence: Secondary | ICD-10-CM | POA: Insufficient documentation

## 2011-08-16 DIAGNOSIS — I4891 Unspecified atrial fibrillation: Secondary | ICD-10-CM | POA: Insufficient documentation

## 2011-08-16 DIAGNOSIS — J449 Chronic obstructive pulmonary disease, unspecified: Secondary | ICD-10-CM | POA: Insufficient documentation

## 2011-08-16 DIAGNOSIS — D72829 Elevated white blood cell count, unspecified: Secondary | ICD-10-CM | POA: Insufficient documentation

## 2011-08-16 DIAGNOSIS — G894 Chronic pain syndrome: Secondary | ICD-10-CM | POA: Insufficient documentation

## 2011-08-16 DIAGNOSIS — Z8673 Personal history of transient ischemic attack (TIA), and cerebral infarction without residual deficits: Secondary | ICD-10-CM | POA: Insufficient documentation

## 2011-08-16 DIAGNOSIS — M79609 Pain in unspecified limb: Secondary | ICD-10-CM | POA: Insufficient documentation

## 2011-08-16 DIAGNOSIS — J4489 Other specified chronic obstructive pulmonary disease: Secondary | ICD-10-CM | POA: Insufficient documentation

## 2011-08-16 LAB — POCT I-STAT TROPONIN I: Troponin i, poc: 0.02 ng/mL (ref 0.00–0.08)

## 2011-08-16 LAB — CBC
MCV: 95.3 fL (ref 78.0–100.0)
Platelets: 135 10*3/uL — ABNORMAL LOW (ref 150–400)
RBC: 3.81 MIL/uL — ABNORMAL LOW (ref 4.22–5.81)
WBC: 22.2 10*3/uL — ABNORMAL HIGH (ref 4.0–10.5)

## 2011-08-16 LAB — BASIC METABOLIC PANEL
CO2: 20 mEq/L (ref 19–32)
Chloride: 103 mEq/L (ref 96–112)
Creatinine, Ser: 2.46 mg/dL — ABNORMAL HIGH (ref 0.50–1.35)
Sodium: 135 mEq/L (ref 135–145)

## 2011-08-16 MED ORDER — DIPHENOXYLATE-ATROPINE 2.5-0.025 MG PO TABS: 1.0000 | ORAL_TABLET | Freq: Four times a day (QID) | ORAL | Status: AC | PRN

## 2011-08-16 MED ORDER — OXYCODONE-ACETAMINOPHEN 5-325 MG PO TABS: 2.0000 | ORAL_TABLET | ORAL | Status: DC | PRN

## 2011-08-16 MED ORDER — FENTANYL CITRATE 0.05 MG/ML IJ SOLN
INTRAMUSCULAR | Status: AC
  Filled 2011-08-16: qty 2

## 2011-08-16 MED ORDER — SODIUM CHLORIDE 0.9 % IV SOLN
Freq: Once | INTRAVENOUS | Status: AC
  Administered 2011-08-16: 17:00:00 via INTRAVENOUS

## 2011-08-16 MED ORDER — SODIUM CHLORIDE 0.9 % IV SOLN
Freq: Once | INTRAVENOUS | Status: AC
  Administered 2011-08-16: 16:00:00 via INTRAVENOUS

## 2011-08-16 MED ORDER — ONDANSETRON 4 MG PO TBDP
4.0000 mg | ORAL_TABLET | Freq: Once | ORAL | Status: AC
  Administered 2011-08-16: 4 mg via ORAL
  Filled 2011-08-16: qty 1

## 2011-08-16 MED ORDER — MORPHINE SULFATE 4 MG/ML IJ SOLN
4.0000 mg | Freq: Once | INTRAMUSCULAR | Status: AC
  Administered 2011-08-16: 4 mg via INTRAVENOUS
  Filled 2011-08-16: qty 1

## 2011-08-16 NOTE — ED Provider Notes (Signed)
History     CSN: [id number]  Arrival date & time 08/16/11  1455   First MD Initiated Contact with Patient 08/16/11 1526      Chief Complaint  Patient presents with  . Arm Pain    states left arm pain 1st episode several days ago; 2nd episode this am  . Weakness  . Hypotension    (Consider location/radiation/quality/duration/timing/severity/associated sxs/prior treatment) Patient is a 78 y.o. male presenting with arm pain and weakness. The history is provided by the patient and the spouse. The history is limited by the condition of the patient.  Arm Pain Pertinent negatives include no chest pain, no abdominal pain and no headaches.  Weakness Primary symptoms do not include headaches, fever or nausea.  Additional symptoms include weakness.   the patient is a 78 year old, male, presents emergency department with weakness.  His wife states that he has had diarrhea for the past 4 days.  He denies nausea, vomiting, fevers.  He denies pain anywhere at this time.  Level V caveat applies for urgent need for intervention.  Because of her systolic blood pressure of 58.  He has not been on antibiotics recently.  Past Medical History  Diagnosis Date  . COPD (chronic obstructive pulmonary disease)   . Osteoporosis   . GERD (gastroesophageal reflux disease)   . Hypertension   . Anemia   . Arthritis   . Depression   . Cerebrovascular disease, unspecified 02/2008    Carotid U/S- right internal carotid 60-79%, left internal carotid artery 40-59%  . Secondary hyperparathyroidism (of renal origin)   . Other and unspecified hyperlipidemia   . Peripheral vascular disease, unspecified   . Atrial fibrillation   . Unspecified disorder resulting from impaired renal function   . Hiatal hernia   . Hyperkalemia     Due to ACE Inhibitors  . Varicosities     LE  . CVA (cerebral infarction)   . Lumbago   . Lumbosacral spondylosis without myelopathy   . Chronic pain syndrome     Past Surgical  History  Procedure Date  . Lumbar fusion 01/2008    Family History  Problem Relation Age of Onset  . Cancer Neg Hx     Colon Cancer  . Stroke Father   . Coronary artery disease Father   . Heart attack Father     History  Substance Use Topics  . Smoking status: Former Smoker -- 1.0 packs/day for 38 years    Types: Cigarettes    Quit date: 05/09/1982  . Smokeless tobacco: Not on file  . Alcohol Use: No      Review of Systems  Constitutional: Negative for fever and chills.  Respiratory: Negative for chest tightness.   Cardiovascular: Negative for chest pain.  Gastrointestinal: Positive for diarrhea. Negative for nausea, abdominal pain and constipation.  Genitourinary: Negative for flank pain.  Musculoskeletal: Negative for back pain.  Neurological: Positive for weakness. Negative for headaches.  Psychiatric/Behavioral: Negative for confusion.  All other systems reviewed and are negative.    Allergies  Review of patient's allergies indicates no known allergies.  Home Medications   Current Outpatient Rx  Name Route Sig Dispense Refill  . ALPRAZOLAM 0.25 MG PO TABS Oral Take 0.25 mg by mouth at bedtime as needed.    Marland Kitchen AMLODIPINE BESYLATE 5 MG PO TABS Oral Take 1 tablet (5 mg total) by mouth daily. 90 tablet 3  . ASPIRIN 81 MG PO TABS Oral Take 81 mg by mouth daily.      Marland Kitchen  ATORVASTATIN CALCIUM 80 MG PO TABS Oral Take 1 tablet (80 mg total) by mouth daily. 30 tablet 11  . CALCITRIOL 0.25 MCG PO CAPS  TAKE 1 CAPSULE BY MOUTH EVERY DAY 90 capsule 3  . VITAMIN D PO Oral Take 1 tablet by mouth daily.    Marland Kitchen CITALOPRAM HYDROBROMIDE 20 MG PO TABS Oral Take 1 tablet (20 mg total) by mouth daily. 90 tablet 3  . DONEPEZIL HCL 10 MG PO TABS Oral Take 10 mg by mouth at bedtime.     Marland Kitchen LOSARTAN POTASSIUM-HCTZ 100-25 MG PO TABS Oral Take 1 tablet by mouth daily. 90 tablet 3  . ONE-DAILY MULTI VITAMINS PO TABS Oral Take 1 tablet by mouth daily.      Marland Kitchen OMEPRAZOLE 20 MG PO CPDR  TAKE 1  CAPSULE TWICE A DAY .. (TAKE 30 MINUTES BEFORE MEALS) 60 capsule 10  . TIOTROPIUM BROMIDE MONOHYDRATE 18 MCG IN CAPS Inhalation Place 1 capsule (18 mcg total) into inhaler and inhale daily. 30 capsule 5  . TRAMADOL-ACETAMINOPHEN 37.5-325 MG PO TABS  1 tablet by mouth 3-4 times daily as needed      BP 62/44  Pulse 85  Temp(Src) 98.1 F (36.7 C) (Oral)  Resp 19  SpO2 98%  Physical Exam  Vitals reviewed. Constitutional: He is oriented to person, place, and time. He appears well-developed and well-nourished. No distress.  HENT:  Head: Normocephalic and atraumatic.  Eyes: Conjunctivae are normal. Pupils are equal, round, and reactive to light.  Neck: Normal range of motion. Neck supple.  Cardiovascular: Normal rate.   No murmur heard. Pulmonary/Chest: Effort normal and breath sounds normal.  Abdominal: Soft. He exhibits no distension. There is no tenderness.  Musculoskeletal: Normal range of motion. He exhibits no edema.  Neurological: He is alert and oriented to person, place, and time.  Skin: Skin is warm and dry.  Psychiatric: He has a normal mood and affect. Thought content normal.    ED Course  Procedures (including critical care time) 78 year old, male, with several day history of diarrhea.  Presents to emergency minute, with weakness, and significant hypotension.  He denies pain at this time.  He has not had a fever, or rash or recent antibiotic use.  We will establish an IV and resuscitated with normal saline, and perform laboratory testing.  There is no indication for medical treatment at this time   Labs Reviewed  CBC  BASIC METABOLIC PANEL   No results found.   No diagnosis found.  5:21 PM Now says left arm pain. No cp. No hx of trauma. Will check trop. Doubt acs.  6:14 PM asx now. Arm pain resolved. No signs acs.   SBP normal after tx.   Diarrhea but no n/v/  Mild incr. Cr but not significantly higher than last cr. = 2.0.  Pt still able to drink so do not think  needs admission.    MDM  Diarrhea, with hypotension Renal insufficiency.  Creatinine is only slightly higher than last creatinine.   Leukocytosis, probably secondary to the dehydration.  No signs of toxicity, severe illness        Cheri Guppy, MD 08/16/11 1816

## 2011-08-16 NOTE — ED Notes (Signed)
Pt in from home with c/o pain to the left arm states no pain at present experienced 2 different episodes also c/o weakness for 3-4 days

## 2011-08-16 NOTE — Discharge Instructions (Signed)
Your blood test, does not show any signs of damage to your heart.  Your kidney function is slightly worse than it was on March 11 when he had your last blood test.  Drink copious amounts of fluid to prevent further dehydration.  Use Lomotil for diarrhea, and Percocet for pain.  Follow up with your Dr. for reevaluation.  If your symptoms.  Last more than 48 hours after starting Lomotil.  Return for worse or uncontrolled symptoms

## 2011-08-16 NOTE — ED Notes (Signed)
Wife brought pt in today because pt has been feeling weak, unsteady feet, decreased in appetite x 3 days, Diarrhea on Saturday night. Pt bp 73/37 in ED. Pt sts the arm pain subsides, denied sob, nausea, and vomiting.

## 2011-08-16 NOTE — ED Notes (Signed)
Dr. Caporossi at bedside. 

## 2011-08-16 NOTE — ED Notes (Signed)
The wife of patient just told this RN that the pt never have good BP reading on his left arm, and people always take BP on his right arm. Pt sts he has been like this since he was a teenage. After BP taken on right arm, BP is 109/67.

## 2011-08-17 ENCOUNTER — Encounter (HOSPITAL_COMMUNITY): Payer: Self-pay | Admitting: *Deleted

## 2011-08-17 ENCOUNTER — Emergency Department (HOSPITAL_COMMUNITY): Payer: Medicare Other

## 2011-08-17 ENCOUNTER — Inpatient Hospital Stay (HOSPITAL_COMMUNITY)
Admission: EM | Admit: 2011-08-17 | Discharge: 2011-09-06 | DRG: 871 | Disposition: A | Discharge status: Skilled Nursing Facility | Payer: Medicare Other | Attending: Internal Medicine | Admitting: Internal Medicine

## 2011-08-17 ENCOUNTER — Encounter: Payer: Medicare Other | Admitting: Physical Medicine and Rehabilitation

## 2011-08-17 DIAGNOSIS — I6789 Other cerebrovascular disease: Secondary | ICD-10-CM | POA: Diagnosis present

## 2011-08-17 DIAGNOSIS — I739 Peripheral vascular disease, unspecified: Secondary | ICD-10-CM | POA: Diagnosis present

## 2011-08-17 DIAGNOSIS — E860 Dehydration: Secondary | ICD-10-CM

## 2011-08-17 DIAGNOSIS — E872 Acidosis, unspecified: Secondary | ICD-10-CM | POA: Diagnosis present

## 2011-08-17 DIAGNOSIS — F0280 Dementia in other diseases classified elsewhere without behavioral disturbance: Secondary | ICD-10-CM | POA: Diagnosis present

## 2011-08-17 DIAGNOSIS — I509 Heart failure, unspecified: Secondary | ICD-10-CM | POA: Diagnosis present

## 2011-08-17 DIAGNOSIS — I2510 Atherosclerotic heart disease of native coronary artery without angina pectoris: Secondary | ICD-10-CM | POA: Diagnosis present

## 2011-08-17 DIAGNOSIS — I129 Hypertensive chronic kidney disease with stage 1 through stage 4 chronic kidney disease, or unspecified chronic kidney disease: Secondary | ICD-10-CM | POA: Diagnosis present

## 2011-08-17 DIAGNOSIS — J690 Pneumonitis due to inhalation of food and vomit: Secondary | ICD-10-CM | POA: Diagnosis present

## 2011-08-17 DIAGNOSIS — R748 Abnormal levels of other serum enzymes: Secondary | ICD-10-CM | POA: Diagnosis present

## 2011-08-17 DIAGNOSIS — Z66 Do not resuscitate: Secondary | ICD-10-CM | POA: Diagnosis present

## 2011-08-17 DIAGNOSIS — I472 Ventricular tachycardia: Secondary | ICD-10-CM | POA: Diagnosis present

## 2011-08-17 DIAGNOSIS — K047 Periapical abscess without sinus: Secondary | ICD-10-CM | POA: Diagnosis present

## 2011-08-17 DIAGNOSIS — R0600 Dyspnea, unspecified: Secondary | ICD-10-CM | POA: Diagnosis present

## 2011-08-17 DIAGNOSIS — D631 Anemia in chronic kidney disease: Secondary | ICD-10-CM | POA: Diagnosis present

## 2011-08-17 DIAGNOSIS — N179 Acute kidney failure, unspecified: Secondary | ICD-10-CM | POA: Diagnosis present

## 2011-08-17 DIAGNOSIS — J449 Chronic obstructive pulmonary disease, unspecified: Secondary | ICD-10-CM | POA: Diagnosis present

## 2011-08-17 DIAGNOSIS — R945 Abnormal results of liver function studies: Secondary | ICD-10-CM | POA: Diagnosis present

## 2011-08-17 DIAGNOSIS — J4489 Other specified chronic obstructive pulmonary disease: Secondary | ICD-10-CM | POA: Diagnosis present

## 2011-08-17 DIAGNOSIS — N039 Chronic nephritic syndrome with unspecified morphologic changes: Secondary | ICD-10-CM | POA: Diagnosis present

## 2011-08-17 DIAGNOSIS — R0902 Hypoxemia: Secondary | ICD-10-CM | POA: Diagnosis present

## 2011-08-17 DIAGNOSIS — N183 Chronic kidney disease, stage 3 unspecified: Secondary | ICD-10-CM | POA: Diagnosis present

## 2011-08-17 DIAGNOSIS — N2581 Secondary hyperparathyroidism of renal origin: Secondary | ICD-10-CM | POA: Diagnosis present

## 2011-08-17 DIAGNOSIS — R197 Diarrhea, unspecified: Secondary | ICD-10-CM | POA: Diagnosis present

## 2011-08-17 DIAGNOSIS — I959 Hypotension, unspecified: Secondary | ICD-10-CM | POA: Diagnosis present

## 2011-08-17 DIAGNOSIS — K560 Paralytic ileus: Secondary | ICD-10-CM | POA: Diagnosis not present

## 2011-08-17 DIAGNOSIS — J189 Pneumonia, unspecified organism: Secondary | ICD-10-CM | POA: Diagnosis present

## 2011-08-17 DIAGNOSIS — R7989 Other specified abnormal findings of blood chemistry: Secondary | ICD-10-CM | POA: Diagnosis present

## 2011-08-17 DIAGNOSIS — I4510 Unspecified right bundle-branch block: Secondary | ICD-10-CM | POA: Diagnosis present

## 2011-08-17 DIAGNOSIS — D638 Anemia in other chronic diseases classified elsewhere: Secondary | ICD-10-CM | POA: Diagnosis present

## 2011-08-17 DIAGNOSIS — K045 Chronic apical periodontitis: Secondary | ICD-10-CM | POA: Diagnosis present

## 2011-08-17 DIAGNOSIS — G309 Alzheimer's disease, unspecified: Secondary | ICD-10-CM | POA: Diagnosis present

## 2011-08-17 DIAGNOSIS — I252 Old myocardial infarction: Secondary | ICD-10-CM

## 2011-08-17 DIAGNOSIS — R932 Abnormal findings on diagnostic imaging of liver and biliary tract: Secondary | ICD-10-CM | POA: Diagnosis present

## 2011-08-17 DIAGNOSIS — J9600 Acute respiratory failure, unspecified whether with hypoxia or hypercapnia: Secondary | ICD-10-CM | POA: Diagnosis not present

## 2011-08-17 DIAGNOSIS — I4891 Unspecified atrial fibrillation: Secondary | ICD-10-CM | POA: Diagnosis present

## 2011-08-17 DIAGNOSIS — E876 Hypokalemia: Secondary | ICD-10-CM | POA: Diagnosis present

## 2011-08-17 DIAGNOSIS — A419 Sepsis, unspecified organism: Principal | ICD-10-CM | POA: Diagnosis present

## 2011-08-17 DIAGNOSIS — D72829 Elevated white blood cell count, unspecified: Secondary | ICD-10-CM

## 2011-08-17 DIAGNOSIS — I5032 Chronic diastolic (congestive) heart failure: Secondary | ICD-10-CM | POA: Diagnosis present

## 2011-08-17 DIAGNOSIS — D696 Thrombocytopenia, unspecified: Secondary | ICD-10-CM | POA: Diagnosis present

## 2011-08-17 DIAGNOSIS — N289 Disorder of kidney and ureter, unspecified: Secondary | ICD-10-CM | POA: Diagnosis present

## 2011-08-17 DIAGNOSIS — N12 Tubulo-interstitial nephritis, not specified as acute or chronic: Secondary | ICD-10-CM | POA: Diagnosis present

## 2011-08-17 DIAGNOSIS — N400 Enlarged prostate without lower urinary tract symptoms: Secondary | ICD-10-CM | POA: Diagnosis present

## 2011-08-17 DIAGNOSIS — R1310 Dysphagia, unspecified: Secondary | ICD-10-CM | POA: Diagnosis present

## 2011-08-17 DIAGNOSIS — E871 Hypo-osmolality and hyponatremia: Secondary | ICD-10-CM | POA: Diagnosis present

## 2011-08-17 DIAGNOSIS — R634 Abnormal weight loss: Secondary | ICD-10-CM | POA: Diagnosis present

## 2011-08-17 DIAGNOSIS — N2889 Other specified disorders of kidney and ureter: Secondary | ICD-10-CM | POA: Diagnosis present

## 2011-08-17 HISTORY — DX: Unspecified right bundle-branch block: I45.10

## 2011-08-17 HISTORY — DX: Benign prostatic hyperplasia without lower urinary tract symptoms: N40.0

## 2011-08-17 HISTORY — DX: Chronic diastolic (congestive) heart failure: I50.32

## 2011-08-17 HISTORY — DX: Chronic kidney disease, stage 3 (moderate): N18.3

## 2011-08-17 HISTORY — DX: Other specified disorders of kidney and ureter: N28.89

## 2011-08-17 HISTORY — DX: Diverticulosis of intestine, part unspecified, without perforation or abscess without bleeding: K57.90

## 2011-08-17 LAB — POCT I-STAT TROPONIN I

## 2011-08-17 MED ORDER — IPRATROPIUM BROMIDE 0.02 % IN SOLN
0.5000 mg | Freq: Once | RESPIRATORY_TRACT | Status: AC
  Administered 2011-08-17: 0.5 mg via RESPIRATORY_TRACT
  Filled 2011-08-17: qty 2.5

## 2011-08-17 MED ORDER — ALBUTEROL SULFATE (5 MG/ML) 0.5% IN NEBU
INHALATION_SOLUTION | RESPIRATORY_TRACT | Status: AC
  Administered 2011-08-17: 22:00:00
  Filled 2011-08-17: qty 1

## 2011-08-17 MED ORDER — ALBUTEROL SULFATE (5 MG/ML) 0.5% IN NEBU
5.0000 mg | INHALATION_SOLUTION | Freq: Once | RESPIRATORY_TRACT | Status: AC
  Administered 2011-08-17: 5 mg via RESPIRATORY_TRACT
  Filled 2011-08-17: qty 1

## 2011-08-17 NOTE — ED Notes (Signed)
Notified MD of pt's troponin level.

## 2011-08-17 NOTE — ED Notes (Signed)
Per EMS:  Pt is from home, complaining of shortness of breath and fever.  Upon EMS' arrival pt was laying on his side, SOB, wheezing.  Wife took his temp and said it was 102.  Pt used his spiriva with no relief.  Pt was seen here yesterday for hypotension and dehydration.  Pt was given 5mg  of albuterol in route, EMS st's he still has some wheezing but pt st's he can breathe easier.  Pt was 90% on RA when Fire got to scene, pt is now 94% on 3L.

## 2011-08-17 NOTE — ED Provider Notes (Signed)
History     CSN: [id number]  Arrival date & time 08/17/11  2134   First MD Initiated Contact with Patient 08/17/11 2259      Chief Complaint  Patient presents with  . Shortness of Breath    HPI Pt was brought in to the emergency room for trouble with breathing and fever.  Pt has been wheezing today.   Pt has history of copd but does not use oxygen at home.  EMS found the patient to be hypoxic and started him on o2.  Pt has been using his inhalers without relief. Pt was seen in the ED yesterday for trouble with diarrhea and low BP.  He was given IV fluids and released.  Some cough but no complaints of pain.  No leg swelling but there has been decreased urine output.  They called his doctor and was told to come to the ED. Past Medical History  Diagnosis Date  . COPD (chronic obstructive pulmonary disease)   . Osteoporosis   . GERD (gastroesophageal reflux disease)   . Hypertension   . Anemia   . Arthritis   . Depression   . Cerebrovascular disease, unspecified 02/2008    Carotid U/S- right internal carotid 60-79%, left internal carotid artery 40-59%  . Secondary hyperparathyroidism (of renal origin)   . Other and unspecified hyperlipidemia   . Peripheral vascular disease, unspecified   . Atrial fibrillation   . Unspecified disorder resulting from impaired renal function   . Hiatal hernia   . Hyperkalemia     Due to ACE Inhibitors  . Varicosities     LE  . CVA (cerebral infarction)   . Lumbago   . Lumbosacral spondylosis without myelopathy   . Chronic pain syndrome     Past Surgical History  Procedure Date  . Lumbar fusion 01/2008    Family History  Problem Relation Age of Onset  . Cancer Neg Hx     Colon Cancer  . Stroke Father   . Coronary artery disease Father   . Heart attack Father     History  Substance Use Topics  . Smoking status: Former Smoker -- 1.0 packs/day for 38 years    Types: Cigarettes    Quit date: 05/09/1982  . Smokeless tobacco: Not on  file  . Alcohol Use: No      Review of Systems  Constitutional: Positive for fever and fatigue.  Respiratory: Positive for shortness of breath.   Gastrointestinal: Positive for diarrhea (last episode this am at 5 am).  Neurological: Positive for headaches (slight).  All other systems reviewed and are negative.    Allergies  Review of patient's allergies indicates no known allergies.  Home Medications   Current Outpatient Rx  Name Route Sig Dispense Refill  . ALPRAZOLAM 0.25 MG PO TABS Oral Take 0.25 mg by mouth daily as needed. Anxiety    . AMLODIPINE BESYLATE 5 MG PO TABS Oral Take 1 tablet (5 mg total) by mouth daily. 90 tablet 3  . ASPIRIN 81 MG PO TABS Oral Take 81 mg by mouth daily.      . ATORVASTATIN CALCIUM 80 MG PO TABS Oral Take 1 tablet (80 mg total) by mouth daily. 30 tablet 11  . CALCITRIOL 0.25 MCG PO CAPS  TAKE 1 CAPSULE BY MOUTH EVERY DAY 90 capsule 3  . VITAMIN D PO Oral Take 1 tablet by mouth daily.    Marland Kitchen CITALOPRAM HYDROBROMIDE 20 MG PO TABS Oral Take 1 tablet (20 mg total)  by mouth daily. 90 tablet 3  . DIPHENOXYLATE-ATROPINE 2.5-0.025 MG PO TABS Oral Take 1 tablet by mouth 4 (four) times daily as needed for diarrhea or loose stools. 30 tablet 0  . LOSARTAN POTASSIUM-HCTZ 100-25 MG PO TABS Oral Take 1 tablet by mouth daily. 90 tablet 3  . ONE-DAILY MULTI VITAMINS PO TABS Oral Take 1 tablet by mouth daily.      Marland Kitchen OMEPRAZOLE 20 MG PO CPDR  TAKE 1 CAPSULE TWICE A DAY .. (TAKE 30 MINUTES BEFORE MEALS) 60 capsule 10  . OXYCODONE-ACETAMINOPHEN 5-325 MG PO TABS Oral Take 1 tablet by mouth every 6 (six) hours as needed. pain    . TIOTROPIUM BROMIDE MONOHYDRATE 18 MCG IN CAPS Inhalation Place 1 capsule (18 mcg total) into inhaler and inhale daily. 30 capsule 5    BP 97/51  Pulse 101  Temp(Src) 99.3 F (37.4 C) (Oral)  Resp 22  SpO2 98%  Physical Exam  Nursing note and vitals reviewed. Constitutional: No distress.       Frail , elderly  HENT:  Head:  Normocephalic and atraumatic.  Right Ear: External ear normal.  Left Ear: External ear normal.  Mouth/Throat: No oropharyngeal exudate (mm dry).  Eyes: Conjunctivae are normal. Right eye exhibits no discharge. Left eye exhibits no discharge. No scleral icterus.  Neck: Neck supple. No tracheal deviation present.  Cardiovascular: Normal rate, regular rhythm and intact distal pulses.   Pulmonary/Chest: Effort normal. No stridor. No respiratory distress. He has wheezes. He has rales (at bases bilaterally).  Abdominal: Soft. Bowel sounds are normal. He exhibits no distension. There is no tenderness. There is no rebound and no guarding.  Musculoskeletal: He exhibits no edema and no tenderness.  Neurological: He is alert. He has normal strength. No sensory deficit. Cranial nerve deficit:  no gross defecits noted. He exhibits normal muscle tone. He displays no seizure activity. Coordination normal.  Skin: Skin is warm and dry. No rash noted.  Psychiatric: He has a normal mood and affect.    ED Course  Procedures (including critical care time)  Date: 08/17/2011  Rate: 86  Rhythm: normal sinus rhythm  QRS Axis: normal  Intervals: normal  ST/T Wave abnormalities: normal  Conduction Disutrbances:right bundle branch block and left anterior fascicular block  Narrative Interpretation:   Old EKG Reviewed: changes noted rate slower since last tracing, st changes diminished since last tracing   Medications  oxyCODONE-acetaminophen (PERCOCET) 5-325 MG per tablet (not administered)  piperacillin-tazobactam (ZOSYN) IVPB 3.375 g (not administered)  ciprofloxacin (CIPRO) IVPB 400 mg (400 mg Intravenous Given 08/18/11 0052)  albuterol (PROVENTIL) (5 MG/ML) 0.5% nebulizer solution (   Given by Other 08/17/11 2220)  albuterol (PROVENTIL) (5 MG/ML) 0.5% nebulizer solution 5 mg (5 mg Nebulization Given 08/17/11 2332)  ipratropium (ATROVENT) nebulizer solution 0.5 mg (0.5 mg Nebulization Given 08/17/11 2332)     Labs Reviewed  PRO B NATRIURETIC PEPTIDE - Abnormal; Notable for the following:    Pro B Natriuretic peptide (BNP) 3612.0 (*)    All other components within normal limits  CBC - Abnormal; Notable for the following:    WBC 20.1 (*)    RBC 3.26 (*)    Hemoglobin 9.7 (*)    HCT 31.1 (*)    Platelets 98 (*)    All other components within normal limits  COMPREHENSIVE METABOLIC PANEL - Abnormal; Notable for the following:    CO2 18 (*)    Glucose, Bld 111 (*)    BUN 42 (*)  Creatinine, Ser 2.50 (*)    Total Protein 5.8 (*)    Albumin 2.6 (*)    AST 91 (*)    ALT 151 (*)    Alkaline Phosphatase 396 (*)    Total Bilirubin 2.3 (*)    GFR calc non Af Amer 22 (*)    GFR calc Af Amer 26 (*)    All other components within normal limits  POCT I-STAT TROPONIN I - Abnormal; Notable for the following:    Troponin i, poc 0.14 (*)    All other components within normal limits  URINALYSIS, ROUTINE W REFLEX MICROSCOPIC   Dg Chest 2 View  08/18/2011  *RADIOLOGY REPORT*  Clinical Data: Shortness of breath.  CHEST - 2 VIEW  Comparison: Chest radiograph performed 05/11/2011  Findings: The lungs are hyperexpanded, with flattening of the hemidiaphragms, likely reflecting COPD.  There is no evidence of focal opacification, pleural effusion or pneumothorax.  The heart is normal in size; the mediastinal contour is within normal limits.  No acute osseous abnormalities are seen.  IMPRESSION: Findings of COPD; no definite superimposed airspace consolidation seen.  Original Report Authenticated By: Tonia Ghent, M.D.    CRITICAL CARE Performed by: Celene Kras   Total critical care time: 35  Critical care time was exclusive of separately billable procedures and treating other patients.  Critical care was necessary to treat or prevent imminent or life-threatening deterioration.  Critical care was time spent personally by me on the following activities: development of treatment plan with patient  and/or surrogate as well as nursing, discussions with consultants, evaluation of patient's response to treatment, examination of patient, obtaining history from patient or surrogate, ordering and performing treatments and interventions, ordering and review of laboratory studies, ordering and review of radiographic studies, pulse oximetry and re-evaluation of patient's condition.   MDM  The patient has a persistent leukocytosis. He is a slight increase in his renal insufficiency and has a metabolic acidosis now. He has elevations in his LFTs associated with hyperbilirubinemia. Early systemic inflammatory response syndrome versus early sepsis is a concern. Empiric Antibiotics have been started although the source of infection is not clear at this time Patient also has a slight increase in his troponin associated with an elevation in his BNP. I suspect this is more associated with heart strain and a possible component of congestive heart failure although his CXR is unremarkable.  Pt does have hypoxia.  Will add on CT of chest and abdomen to evaluate for gallbladder liver pathology as well.  Unable to perform CT of chest with his elevated cr to rule out PE with his hypoxia.  Subclinical pneumonia is concern as well.  At this time, pt remains alert in no distress.  Repeat abdominal exam still without tenderness.          Celene Kras, MD 08/18/11 [phone]

## 2011-08-17 NOTE — ED Notes (Signed)
ZOX:[id number]<VW> Expected date:08/17/11<BR> Expected time:<BR> Means of arrival:<BR> Comments:<BR> EMS 261 GC = sob/pneumonia

## 2011-08-17 NOTE — ED Notes (Signed)
Ladona Ridgel RN aware of troponin level and will notify MD.

## 2011-08-17 NOTE — ED Notes (Signed)
Patient transported to X-ray 

## 2011-08-18 ENCOUNTER — Encounter (HOSPITAL_COMMUNITY): Payer: Self-pay | Admitting: *Deleted

## 2011-08-18 ENCOUNTER — Emergency Department (HOSPITAL_COMMUNITY): Payer: Medicare Other

## 2011-08-18 ENCOUNTER — Encounter: Payer: Medicare Other | Admitting: Endocrinology

## 2011-08-18 ENCOUNTER — Telehealth: Payer: Self-pay

## 2011-08-18 DIAGNOSIS — D649 Anemia, unspecified: Secondary | ICD-10-CM | POA: Insufficient documentation

## 2011-08-18 DIAGNOSIS — E871 Hypo-osmolality and hyponatremia: Secondary | ICD-10-CM | POA: Diagnosis present

## 2011-08-18 DIAGNOSIS — N2889 Other specified disorders of kidney and ureter: Secondary | ICD-10-CM

## 2011-08-18 DIAGNOSIS — R945 Abnormal results of liver function studies: Secondary | ICD-10-CM | POA: Diagnosis present

## 2011-08-18 DIAGNOSIS — I4510 Unspecified right bundle-branch block: Secondary | ICD-10-CM

## 2011-08-18 DIAGNOSIS — K5790 Diverticulosis of intestine, part unspecified, without perforation or abscess without bleeding: Secondary | ICD-10-CM | POA: Insufficient documentation

## 2011-08-18 DIAGNOSIS — N400 Enlarged prostate without lower urinary tract symptoms: Secondary | ICD-10-CM

## 2011-08-18 DIAGNOSIS — R748 Abnormal levels of other serum enzymes: Secondary | ICD-10-CM | POA: Diagnosis present

## 2011-08-18 DIAGNOSIS — N183 Chronic kidney disease, stage 3 unspecified: Secondary | ICD-10-CM

## 2011-08-18 DIAGNOSIS — E876 Hypokalemia: Secondary | ICD-10-CM | POA: Diagnosis present

## 2011-08-18 DIAGNOSIS — E872 Acidosis, unspecified: Secondary | ICD-10-CM | POA: Diagnosis present

## 2011-08-18 DIAGNOSIS — N179 Acute kidney failure, unspecified: Secondary | ICD-10-CM | POA: Diagnosis present

## 2011-08-18 DIAGNOSIS — R0600 Dyspnea, unspecified: Secondary | ICD-10-CM | POA: Diagnosis present

## 2011-08-18 DIAGNOSIS — R7989 Other specified abnormal findings of blood chemistry: Secondary | ICD-10-CM | POA: Diagnosis present

## 2011-08-18 DIAGNOSIS — D72829 Elevated white blood cell count, unspecified: Secondary | ICD-10-CM | POA: Diagnosis present

## 2011-08-18 DIAGNOSIS — I959 Hypotension, unspecified: Secondary | ICD-10-CM | POA: Diagnosis present

## 2011-08-18 DIAGNOSIS — R0902 Hypoxemia: Secondary | ICD-10-CM | POA: Diagnosis present

## 2011-08-18 DIAGNOSIS — I252 Old myocardial infarction: Secondary | ICD-10-CM

## 2011-08-18 HISTORY — DX: Diverticulosis of intestine, part unspecified, without perforation or abscess without bleeding: K57.90

## 2011-08-18 HISTORY — DX: Other specified disorders of kidney and ureter: N28.89

## 2011-08-18 HISTORY — DX: Chronic kidney disease, stage 3 unspecified: N18.30

## 2011-08-18 HISTORY — DX: Unspecified right bundle-branch block: I45.10

## 2011-08-18 HISTORY — DX: Benign prostatic hyperplasia without lower urinary tract symptoms: N40.0

## 2011-08-18 LAB — CBC
HCT: 31.3 % — ABNORMAL LOW (ref 39.0–52.0)
Hemoglobin: 9.9 g/dL — ABNORMAL LOW (ref 13.0–17.0)
MCH: 29.9 pg (ref 26.0–34.0)
MCHC: 31.6 g/dL (ref 30.0–36.0)
Platelets: 98 10*3/uL — ABNORMAL LOW (ref 150–400)
RBC: 3.26 MIL/uL — ABNORMAL LOW (ref 4.22–5.81)
RDW: 13.6 % (ref 11.5–15.5)
WBC: 20.1 10*3/uL — ABNORMAL HIGH (ref 4.0–10.5)

## 2011-08-18 LAB — URINALYSIS, ROUTINE W REFLEX MICROSCOPIC
Glucose, UA: NEGATIVE mg/dL
Hgb urine dipstick: NEGATIVE
Protein, ur: 30 mg/dL — AB
Specific Gravity, Urine: 1.017 (ref 1.005–1.030)
Urobilinogen, UA: 0.2 mg/dL (ref 0.0–1.0)

## 2011-08-18 LAB — COMPREHENSIVE METABOLIC PANEL
ALT: 151 U/L — ABNORMAL HIGH (ref 0–53)
ALT: 149 U/L — ABNORMAL HIGH (ref 0–53)
AST: 91 U/L — ABNORMAL HIGH (ref 0–37)
AST: 85 U/L — ABNORMAL HIGH (ref 0–37)
Alkaline Phosphatase: 413 U/L — ABNORMAL HIGH (ref 39–117)
CO2: 18 mEq/L — ABNORMAL LOW (ref 19–32)
CO2: 19 mEq/L (ref 19–32)
Calcium: 8.3 mg/dL — ABNORMAL LOW (ref 8.4–10.5)
Chloride: 106 mEq/L (ref 96–112)
GFR calc Af Amer: 27 mL/min — ABNORMAL LOW (ref >90)
GFR calc non Af Amer: 22 mL/min — ABNORMAL LOW (ref >90)
GFR calc non Af Amer: 23 mL/min — ABNORMAL LOW (ref >90)
Glucose, Bld: 109 mg/dL — ABNORMAL HIGH (ref 70–99)
Potassium: 3.1 mEq/L — ABNORMAL LOW (ref 3.5–5.1)
Sodium: 136 mEq/L (ref 135–145)
Sodium: 133 mEq/L — ABNORMAL LOW (ref 135–145)
Total Bilirubin: 2.3 mg/dL — ABNORMAL HIGH (ref 0.3–1.2)
Total Protein: 5.9 g/dL — ABNORMAL LOW (ref 6.0–8.3)

## 2011-08-18 LAB — URINE MICROSCOPIC-ADD ON

## 2011-08-18 LAB — LACTIC ACID, PLASMA: Lactic Acid, Venous: 1.2 mmol/L (ref 0.5–2.2)

## 2011-08-18 LAB — PRO B NATRIURETIC PEPTIDE: Pro B Natriuretic peptide (BNP): 3612 pg/mL — ABNORMAL HIGH (ref 0–450)

## 2011-08-18 LAB — CARDIAC PANEL(CRET KIN+CKTOT+MB+TROPI)
CK, MB: 10.9 ng/mL (ref 0.3–4.0)
CK, MB: 14.8 ng/mL (ref 0.3–4.0)
Relative Index: 3.4 — ABNORMAL HIGH (ref 0.0–2.5)
Total CK: 340 U/L — ABNORMAL HIGH (ref 7–232)
Troponin I: <0.3 ng/mL (ref <0.30)
Troponin I: <0.3 ng/mL (ref <0.30)

## 2011-08-18 LAB — PROCALCITONIN: Procalcitonin: 2.18 ng/mL

## 2011-08-18 LAB — MAGNESIUM: Magnesium: 2 mg/dL (ref 1.5–2.5)

## 2011-08-18 MED ORDER — POTASSIUM CHLORIDE 10 MEQ/100ML IV SOLN
10.0000 meq | INTRAVENOUS | Status: AC
  Administered 2011-08-18 (×2): 10 meq via INTRAVENOUS
  Filled 2011-08-18 (×2): qty 100

## 2011-08-18 MED ORDER — SODIUM CHLORIDE 0.9 % IV SOLN
INTRAVENOUS | Status: AC
  Administered 2011-08-18: 04:00:00 via INTRAVENOUS

## 2011-08-18 MED ORDER — ONDANSETRON HCL 4 MG PO TABS: 4.0000 mg | ORAL_TABLET | Freq: Four times a day (QID) | ORAL | Status: DC | PRN

## 2011-08-18 MED ORDER — PIPERACILLIN-TAZOBACTAM 3.375 G IVPB
3.3750 g | Freq: Once | INTRAVENOUS | Status: AC
  Administered 2011-08-18: 3.375 g via INTRAVENOUS
  Filled 2011-08-18: qty 50

## 2011-08-18 MED ORDER — IPRATROPIUM BROMIDE 0.02 % IN SOLN
0.5000 mg | Freq: Four times a day (QID) | RESPIRATORY_TRACT | Status: DC
  Administered 2011-08-18 (×3): 0.5 mg via RESPIRATORY_TRACT
  Filled 2011-08-18 (×3): qty 2.5

## 2011-08-18 MED ORDER — METRONIDAZOLE IN NACL 5-0.79 MG/ML-% IV SOLN
500.0000 mg | Freq: Three times a day (TID) | INTRAVENOUS | Status: DC
  Administered 2011-08-18: 500 mg via INTRAVENOUS
  Filled 2011-08-18 (×3): qty 100

## 2011-08-18 MED ORDER — ONDANSETRON HCL 4 MG/2ML IJ SOLN
4.0000 mg | Freq: Four times a day (QID) | INTRAMUSCULAR | Status: DC | PRN
  Administered 2011-08-21 – 2011-08-28 (×3): 4 mg via INTRAVENOUS
  Filled 2011-08-18 (×4): qty 2

## 2011-08-18 MED ORDER — METRONIDAZOLE 500 MG PO TABS
500.0000 mg | ORAL_TABLET | Freq: Three times a day (TID) | ORAL | Status: DC
  Administered 2011-08-18 – 2011-08-19 (×5): 500 mg via ORAL
  Filled 2011-08-18 (×7): qty 1

## 2011-08-18 MED ORDER — CITALOPRAM HYDROBROMIDE 20 MG PO TABS
20.0000 mg | ORAL_TABLET | Freq: Every day | ORAL | Status: DC
  Administered 2011-08-18 – 2011-09-06 (×20): 20 mg via ORAL
  Filled 2011-08-18 (×20): qty 1

## 2011-08-18 MED ORDER — NEPRO/CARBSTEADY PO LIQD
237.0000 mL | Freq: Every day | ORAL | Status: DC | PRN
  Filled 2011-08-18: qty 237

## 2011-08-18 MED ORDER — HYDROCORTISONE SOD SUCCINATE 100 MG IJ SOLR
50.0000 mg | Freq: Four times a day (QID) | INTRAMUSCULAR | Status: DC
  Administered 2011-08-18: 50 mg via INTRAVENOUS
  Filled 2011-08-18: qty 2

## 2011-08-18 MED ORDER — ALBUTEROL SULFATE (5 MG/ML) 0.5% IN NEBU
2.5000 mg | INHALATION_SOLUTION | RESPIRATORY_TRACT | Status: DC | PRN
  Administered 2011-08-18: 2.5 mg via RESPIRATORY_TRACT
  Filled 2011-08-18 (×2): qty 0.5

## 2011-08-18 MED ORDER — ACETAMINOPHEN 650 MG RE SUPP
650.0000 mg | Freq: Four times a day (QID) | RECTAL | Status: DC | PRN
  Administered 2011-08-29: 650 mg via RECTAL
  Filled 2011-08-18: qty 1

## 2011-08-18 MED ORDER — ACETAMINOPHEN 325 MG PO TABS
650.0000 mg | ORAL_TABLET | Freq: Four times a day (QID) | ORAL | Status: DC | PRN
  Administered 2011-08-26 (×2): 650 mg via ORAL
  Filled 2011-08-18 (×3): qty 2

## 2011-08-18 MED ORDER — HYDROCORTISONE SOD SUCCINATE 100 MG IJ SOLR
50.0000 mg | Freq: Two times a day (BID) | INTRAMUSCULAR | Status: DC
  Administered 2011-08-18 – 2011-08-19 (×2): 50 mg via INTRAVENOUS
  Filled 2011-08-18: qty 1
  Filled 2011-08-18 (×2): qty 2
  Filled 2011-08-18: qty 1

## 2011-08-18 MED ORDER — ASPIRIN EC 81 MG PO TBEC
81.0000 mg | DELAYED_RELEASE_TABLET | Freq: Every day | ORAL | Status: DC
  Administered 2011-08-18 – 2011-09-06 (×20): 81 mg via ORAL
  Filled 2011-08-18 (×20): qty 1

## 2011-08-18 MED ORDER — SODIUM CHLORIDE 0.9 % IJ SOLN
3.0000 mL | Freq: Two times a day (BID) | INTRAMUSCULAR | Status: DC
  Administered 2011-08-18 – 2011-09-06 (×30): 3 mL via INTRAVENOUS

## 2011-08-18 MED ORDER — OXYCODONE-ACETAMINOPHEN 5-325 MG PO TABS: 1.0000 | ORAL_TABLET | Freq: Four times a day (QID) | ORAL | Status: DC | PRN

## 2011-08-18 MED ORDER — ALUM & MAG HYDROXIDE-SIMETH 200-200-20 MG/5ML PO SUSP
30.0000 mL | Freq: Four times a day (QID) | ORAL | Status: DC | PRN
  Administered 2011-08-18 – 2011-09-04 (×2): 30 mL via ORAL
  Filled 2011-08-18 (×2): qty 30

## 2011-08-18 MED ORDER — POTASSIUM CHLORIDE CRYS ER 20 MEQ PO TBCR
40.0000 meq | EXTENDED_RELEASE_TABLET | Freq: Once | ORAL | Status: AC
  Administered 2011-08-18: 40 meq via ORAL
  Filled 2011-08-18: qty 2

## 2011-08-18 MED ORDER — CIPROFLOXACIN IN D5W 400 MG/200ML IV SOLN
400.0000 mg | Freq: Two times a day (BID) | INTRAVENOUS | Status: DC
  Administered 2011-08-18: 400 mg via INTRAVENOUS
  Filled 2011-08-18 (×2): qty 200

## 2011-08-18 MED ORDER — ALPRAZOLAM 0.25 MG PO TABS
0.2500 mg | ORAL_TABLET | Freq: Every day | ORAL | Status: DC
  Administered 2011-08-18 – 2011-08-19 (×2): 0.25 mg via ORAL
  Filled 2011-08-18 (×2): qty 1

## 2011-08-18 MED ORDER — CALCITRIOL 0.25 MCG PO CAPS
0.2500 ug | ORAL_CAPSULE | Freq: Every day | ORAL | Status: DC
  Administered 2011-08-18 – 2011-09-02 (×16): 0.25 ug via ORAL
  Filled 2011-08-18 (×16): qty 1

## 2011-08-18 MED ORDER — DEXTROSE 5 % IV SOLN
1.0000 g | INTRAVENOUS | Status: DC
  Administered 2011-08-18 – 2011-08-19 (×2): 1 g via INTRAVENOUS
  Filled 2011-08-18 (×3): qty 10

## 2011-08-18 NOTE — Progress Notes (Signed)
INITIAL ADULT NUTRITION ASSESSMENT Date: 08/18/2011   Time: 10:46 AM Reason for Assessment: Screened for nutrition risk for unintentional weight loss of > 10 lb over 1 month.   ASSESSMENT: Male 78 y.o.  Dx: Diarrhea  Hx:  Past Medical History  Diagnosis Date  . COPD (chronic obstructive pulmonary disease)   . Osteoporosis   . GERD (gastroesophageal reflux disease)   . Hypertension   . Anemia   . Arthritis   . Depression   . Cerebrovascular disease, unspecified 02/2008    Carotid U/S- right internal carotid 60-79%, left internal carotid artery 40-59%  . Secondary hyperparathyroidism (of renal origin)   . Other and unspecified hyperlipidemia   . Peripheral vascular disease, unspecified   . Atrial fibrillation   . Unspecified disorder resulting from impaired renal function   . Hiatal hernia   . Hyperkalemia     Due to ACE Inhibitors  . Varicosities     LE  . CVA (cerebral infarction)   . Lumbago   . Lumbosacral spondylosis without myelopathy   . Chronic pain syndrome   . Diverticulosis 08/18/2011  . Enlarged prostate 08/18/2011  . CKD (chronic kidney disease) stage 3, GFR 30-59 ml/min 08/18/2011  . Left renal mass 08/18/2011  . Right bundle branch block 08/18/2011    Related Meds:  Scheduled Meds:   . albuterol  5 mg Nebulization Once  . albuterol      . ALPRAZolam  0.25 mg Oral QHS  . aspirin EC  81 mg Oral Daily  . calcitRIOL  0.25 mcg Oral Daily  . cefTRIAXone (ROCEPHIN)  IV  1 g Intravenous Q24H  . citalopram  20 mg Oral Daily  . hydrocortisone sod succinate (SOLU-CORTEF) injection  50 mg Intravenous Q12H  . ipratropium  0.5 mg Nebulization Once  . ipratropium  0.5 mg Nebulization Q6H  . metroNIDAZOLE  500 mg Oral Q8H  . piperacillin-tazobactam (ZOSYN)  IV  3.375 g Intravenous Once  . potassium chloride  10 mEq Intravenous Q1 Hr x 2  . potassium chloride  40 mEq Oral Once  . sodium chloride  3 mL Intravenous Q12H  . DISCONTD: ciprofloxacin  400 mg Intravenous  Q12H  . DISCONTD: hydrocortisone sod succinate (SOLU-CORTEF) injection  50 mg Intravenous Q6H  . DISCONTD: metronidazole  500 mg Intravenous Q8H   Continuous Infusions:   . sodium chloride 50 mL/hr at 08/18/11 0355   PRN Meds:.acetaminophen, acetaminophen, albuterol, alum & mag hydroxide-simeth, ondansetron (ZOFRAN) IV, ondansetron, oxyCODONE-acetaminophen   Ht: 5\' 10"  (177.8 cm)  Wt: 149 lb 11.1 oz (67.9 kg)  Ideal Wt: 75.45 kg % Ideal Wt: 89.75%  Usual Wt: 157 lb. Approximately 2 weeks ago per patient % Usual Wt: 94.9%  *Patient with 8 lb weight loss over 2 weeks (5.09% from baseline).   Body mass index is 21.48 kg/(m^2).  Food/Nutrition Related Hx: Patient reports poor appetite. He reported appetite and PO intake have been poor over the past month. He said he has been eating small amounts. He refuses  snacks or nutrition supplements to improve PO intake at this time.   Labs:  CMP     Component Value Date/Time   NA 133* 08/18/2011 0424   K 3.1* 08/18/2011 0424   CL 103 08/18/2011 0424   CO2 19 08/18/2011 0424   GLUCOSE 109* 08/18/2011 0424   BUN 43* 08/18/2011 0424   CREATININE 2.44* 08/18/2011 0424   CALCIUM 8.3* 08/18/2011 0424   CALCIUM 9.2 07/18/2011 0947   PROT 5.9* 08/18/2011  0424   ALBUMIN 2.6* 08/18/2011 0424   AST 85* 08/18/2011 0424   ALT 149* 08/18/2011 0424   ALKPHOS 413* 08/18/2011 0424   BILITOT 2.2* 08/18/2011 0424   GFRNONAA 23* 08/18/2011 0424   GFRAA 27* 08/18/2011 0424    Intake/Output Summary (Last 24 hours) at 08/18/11 1052 Last data filed at 08/18/11 1610  Gross per 24 hour  Intake    353 ml  Output      0 ml  Net    353 ml     Diet Order: Cardiac  Supplements/Tube Feeding: none at this time.   IVF:    sodium chloride Last Rate: 50 mL/hr at 08/18/11 0355    Estimated Nutritional Needs:   Kcal: 2031-2370 Protein: 40.6-50.79 grams Fluid: minimum of 1 ml per kcal  NUTRITION DIAGNOSIS: -Inadequate oral intake (NI-2.1).  Status:  Ongoing  RELATED TO: poor appetite  AS EVIDENCE BY: patient reports poor appetite and PO intake over the past month and patient with unintentional weight loss of 8 lb over 2 weeks.   MONITORING/EVALUATION(Goals): Weight trends, labs, PO intake 1. Minimize weight loss. 2. PO intake > 75% at meals  EDUCATION NEEDS: -No education needs identified at this time  INTERVENTION: 1. Will order Nepro shake PRN. Provides 425 kcal and 19 grams of protein. 2. RD to follow for nutrition plan of care.  Dietitian [phone]  DOCUMENTATION CODES Per approved criteria  -Non-severe (moderate) malnutrition in the context of chronic illness  *Patient meets criteria for non severe moderate malnutrition in the context of chronic illness due to reporting consuming less than 75% of estimated energy requirements for greater than or equal to month and unintentional weight loss of 5% from baseline.   Iven Finn [facility] 08/18/2011, 10:46 AM

## 2011-08-18 NOTE — ED Notes (Signed)
Pt reminded of the need for urine.  

## 2011-08-18 NOTE — H&P (Signed)
Hospital Admission Note Date: 08/18/2011  Patient name: Brandon [name] Medical record number: [id number] Date of birth: 03-03-1926 Age: 78 y.o. Gender: male PCP: Romero Belling, MD, MD  Medical Service: [facility]  Attending physician:  P.Joseph     Chief Complaint: Diarrhea, Acute onset dyspnea  History of Present Illness: This is an 78 year old gentleman with a past medical history that is extensive including chronic renal failure, hypertension, Alzheimer's disease, COPD, atrial fibrillation and many other chronic medical problems who despite these has maintained a high functional status at home and lives independently with his wife. He was brought to the emergency department here at [facility] yesterday by his wife after she says he had a 3-4 day history of loose runny stools, watery, decreased oral intake, worsening weakness and fatigue. Workup in the ED was done, given lomotil and patient was discharged with symptomatic care only. He returns today to the emergency department with continued diarrhea bed with new onset shortness of breath dyspnea and hypotension. His wife is not available at bedside and patient is unable to provide complete history. From the records he was recently treated for bronchitis, gets frequent pred paks for COPD flares and baseline CAD.     Meds: Medications Prior to Admission  Medication Dose Route Frequency Provider Last Rate Last Dose  . 0.9 %  sodium chloride infusion   Intravenous Once Cheri Guppy, MD 1,000 mL/hr at 08/16/11 1539    . 0.9 %  sodium chloride infusion   Intravenous Once Cheri Guppy, MD 1,000 mL/hr at 08/16/11 1539    . 0.9 %  sodium chloride infusion   Intravenous Once Cheri Guppy, MD 1,000 mL/hr at 08/16/11 1703    . albuterol (PROVENTIL) (5 MG/ML) 0.5% nebulizer solution 5 mg  5 mg Nebulization Once Celene Kras, MD   5 mg at 08/17/11 2332  . albuterol (PROVENTIL) (5 MG/ML) 0.5% nebulizer solution           .  ciprofloxacin (CIPRO) IVPB 400 mg  400 mg Intravenous Q12H Celene Kras, MD   400 mg at 08/18/11 0052  . ipratropium (ATROVENT) nebulizer solution 0.5 mg  0.5 mg Nebulization Once Celene Kras, MD   0.5 mg at 08/17/11 2332  . ipratropium (ATROVENT) nebulizer solution 0.5 mg  0.5 mg Nebulization Q6H Edsel Petrin, DO      . morphine 4 MG/ML injection 4 mg  4 mg Intravenous Once Cheri Guppy, MD   4 mg at 08/16/11 1746  . ondansetron (ZOFRAN-ODT) disintegrating tablet 4 mg  4 mg Oral Once Cheri Guppy, MD   4 mg at 08/16/11 1745  . piperacillin-tazobactam (ZOSYN) IVPB 3.375 g  3.375 g Intravenous Once Celene Kras, MD      . DISCONTD: fentaNYL (SUBLIMAZE) 0.05 MG/ML injection            Medications Prior to Admission  Medication Sig Dispense Refill  . ALPRAZolam (XANAX) 0.25 MG tablet Take 0.25 mg by mouth daily as needed. Anxiety      . amLODipine (NORVASC) 5 MG tablet Take 1 tablet (5 mg total) by mouth daily.  90 tablet  3  . aspirin 81 MG tablet Take 81 mg by mouth daily.        Marland Kitchen atorvastatin (LIPITOR) 80 MG tablet Take 1 tablet (80 mg total) by mouth daily.  30 tablet  11  . calcitRIOL (ROCALTROL) 0.25 MCG capsule TAKE 1 CAPSULE BY MOUTH EVERY DAY  90 capsule  3  . Cholecalciferol (  VITAMIN D PO) Take 1 tablet by mouth daily.      . citalopram (CELEXA) 20 MG tablet Take 1 tablet (20 mg total) by mouth daily.  90 tablet  3  . diphenoxylate-atropine (LOMOTIL) 2.5-0.025 MG per tablet Take 1 tablet by mouth 4 (four) times daily as needed for diarrhea or loose stools.  30 tablet  0  . losartan-hydrochlorothiazide (HYZAAR) 100-25 MG per tablet Take 1 tablet by mouth daily.  90 tablet  3  . Multiple Vitamin (MULTIVITAMIN) tablet Take 1 tablet by mouth daily.        Marland Kitchen omeprazole (PRILOSEC) 20 MG capsule TAKE 1 CAPSULE TWICE A DAY .. (TAKE 30 MINUTES BEFORE MEALS)  60 capsule  10  . oxyCODONE-acetaminophen (PERCOCET) 5-325 MG per tablet Take 1 tablet by mouth every 6 (six) hours as  needed. pain      . tiotropium (SPIRIVA) 18 MCG inhalation capsule Place 1 capsule (18 mcg total) into inhaler and inhale daily.  30 capsule  5    Allergies: Allergies as of 08/17/2011  . (No Known Allergies)   Past Medical History  Diagnosis Date  . COPD (chronic obstructive pulmonary disease)   . Osteoporosis   . GERD (gastroesophageal reflux disease)   . Hypertension   . Anemia   . Arthritis   . Depression   . Cerebrovascular disease, unspecified 02/2008    Carotid U/S- right internal carotid 60-79%, left internal carotid artery 40-59%  . Secondary hyperparathyroidism (of renal origin)   . Other and unspecified hyperlipidemia   . Peripheral vascular disease, unspecified   . Atrial fibrillation   . Unspecified disorder resulting from impaired renal function   . Hiatal hernia   . Hyperkalemia     Due to ACE Inhibitors  . Varicosities     LE  . CVA (cerebral infarction)   . Lumbago   . Lumbosacral spondylosis without myelopathy   . Chronic pain syndrome    Past Surgical History  Procedure Date  . Lumbar fusion 01/2008   Family History  Problem Relation Age of Onset  . Cancer Neg Hx     Colon Cancer  . Stroke Father   . Coronary artery disease Father   . Heart attack Father    History   Social History  . Marital Status: Married    Spouse Name: Erskine Squibb    Number of Children: Y  . Years of Education: N/A   Occupational History  . Art gallery manager     retired   Social History Main Topics  . Smoking status: Former Smoker -- 1.0 packs/day for 38 years    Types: Cigarettes    Quit date: 05/09/1982  . Smokeless tobacco: Not on file  . Alcohol Use: No  . Drug Use: No  . Sexually Active: Not on file   Other Topics Concern  . Not on file   Social History Narrative  . No narrative on file    Review of Systems: Pertinent items are noted in HPI.  Physical Exam: Blood pressure 97/51, pulse 101, temperature 99.3 F (37.4 C), temperature source Oral, resp. rate 22,  SpO2 98.00%.  General appearance: Frail, pale elderly gentleman NAD, minimally conversant  Eyes: anicteric sclerae, moist conjunctivae; no lid-lag; PERRLA HENT: Atraumatic; oropharynx clear with dry mucous membranes and no mucosal ulcerations; normal hard and soft palate Neck: Trachea midline; FROM, supple, no thyromegaly or lymphadenopathy Lungs: Scattered wheezing but MOSTLY clear, with normal respiratory effort and no intercostal retractions CV: RRR, no MRGs  Abdomen: Soft, non-tender; no masses or HSM Extremities: No peripheral edema or extremity lymphadenopathy Skin: Normal temperature, turgor and texture; no rash, ulcers or subcutaneous nodules Psych: Appropriate affect, oriented to person and place but not time.  Lab results: Basic Metabolic Panel:  Basename 08/17/11 2331 08/16/11 1532  NA 136 135  K 3.2* 4.7  CL 106 103  CO2 18* 20  GLUCOSE 111* 113*  BUN 42* 41*  CREATININE 2.50* 2.46*  CALCIUM 8.4 9.4  MG -- --  PHOS -- --   Liver Function Tests:  [facility] 08/17/11 2331  AST 91*  ALT 151*  ALKPHOS 396*  BILITOT 2.3*  PROT 5.8*  ALBUMIN 2.6*   No results found for this basename: LIPASE:2,AMYLASE:2 in the last 72 hours No results found for this basename: AMMONIA:2 in the last 72 hours CBC:  Basename 08/17/11 2331 08/16/11 1532  WBC 20.1* 22.2*  NEUTROABS -- --  HGB 9.7* 11.5*  HCT 31.1* 36.3*  MCV 95.4 95.3  PLT 98* 135*   BNP:  Basename 08/17/11 2331  PROBNP 3612.0*    Imaging results:  Dg Chest 2 View  08/18/2011  *RADIOLOGY REPORT*  Clinical Data: Shortness of breath.  CHEST - 2 VIEW  Comparison: Chest radiograph performed 05/11/2011  Findings: The lungs are hyperexpanded, with flattening of the hemidiaphragms, likely reflecting COPD.  There is no evidence of focal opacification, pleural effusion or pneumothorax.  The heart is normal in size; the mediastinal contour is within normal limits.  No acute osseous abnormalities are seen.  IMPRESSION:  Findings of COPD; no definite superimposed airspace consolidation seen.  Original Report Authenticated By: Tonia Ghent, M.D.    Other results: EKG: NS ST-T changes, chronic RBBB  Assessment & Plan by Problem: Principal Problem: 1. Sepsis; Hypotension, leukocytosis, tachycardia, low grade temp-etiology unknown CXR nothing acute, UA pending, Blood cx pending-Foley placed and UA asked to be sent stat.  2. Diarrhea: Patient's major complaint is diarrhea for the past 3-4 days he also has a significant leukocytosis and a history of prior antibiotic use for acute bronchitis highly suspicious for infectious diarrhea such as C. difficile colitis or viral infection. Plan is to check a C. difficile PCR, without isolation, very gentle hydration given the mildly elevated BNP renal failure, will also empirically treat with Flagyl IV, and diet as tolerated will start with full liquids. Patient is not a significant amount of abdominal pain associated with his diarrhea however notably today he has an elevated alkaline phosphatase which needs to be followed-her source of the sepsis is largely unidentified at this point.  Active Problems: 2. Renal failure: Acute on chronic renal failure he has had worsening of his creatinine to 2.5 it appears his baseline is around 1.8. He has dehydration and probably some degree of ischemia in prerenal failure we'll try gentle hydration check BMET in the morning.   3. Abnormal LFTs: Elevated Bili and AP, should consider Abdominal US, CT of abdomen w/o contrast pending as ordered in ED. RUQ non-tender.  4. Hypotension; Sepsis related vs. AI needing stress dose steroids/ check cortisol, started Hydrocortisone  5. Dyspnea/COPD/elevated BNP, although major complaint at time of arrival he got  much better with nebs, possible mild COPD flare, will use stress steroids, levaquin IV empirically. Nothing acute on CXR. Also has elevated BNP and BP cannot tolerate diuresis at this  point-clinically he is volume depleted not overloaded, needs re-eval in AM.  6. Metabolic acidosis, normal anion gap (NAG): related to diarrhea most likely/RTA. Follow renal  function and bicarb.  7. Elevated troponin; possible demand ischemia in setting of dehydration, will cycle, nothing acute on EKG.   8. Anemia: Chronic on Aranesp   9. Alzheimer's disease; Stable, mild high fall risk, will monitor for delirium, takes PM xanax at home for sleep   Admitted to step-down for close monitoring of respiratory and hemodynamic status.  SignedAnderson Malta 08/18/2011, 1:51 AM

## 2011-08-18 NOTE — Telephone Encounter (Signed)
[facility] Triage Call Report Triage Record Num: [id number] Operator: Frederico Hamman Patient Name: Brandon [name] Call Date & Time: 08/17/2011 7:43:20PM Patient Phone: [phone] PCP: Patient Gender: Male PCP Fax : Patient DOB: Feb 12, 1926 Practice Name: [facility] Reason for Call: Caller: Hart/Other; PCP: Romero Belling; CB#: [id number]; Call regarding Low BP, Fever and Not Eating; Will not get OOB. Last urinated 0530 Son/ Edwyna Shell states Dad/ [name] was seen [facility] ED on 08/16/11/ for Hypotension, Fever and poor appetite. Received IV fluids and was dischargeed. Took BP med on 08/17/11. Onset of Temp 102 @ 0900. Temp 102 @ 1915. Breathing is more labored with fever. Refuses to get OOB today. Has not voided since 0530.Sleepy. BP 84/54 @ 1915. Diarrhea x one this AM. Has appt to be seen in office on 08/18/11.Per dehydration protocol advised to be seen in ED due to no urination for 8 hr or more. Father is in upstairs bedroom. Advised son to call 911 if unable to standup or has severe weakness. Care advice given. Protocol(s) Used: Dehydration Recommended Outcome per Protocol: See ED Immediately Reason for Outcome: No urination for 8 or more hours Care Advice: ~ Another adult should drive. Call EMS 911 if signs and symptoms of shock develop (such as unable to stand due to faintness, dizziness, or lightheadedness; new onset of confusion; slow to respond or difficult to awaken; skin is pale, gray, cool, or moist to touch; severe weakness; loss of consciousness). ~ ~ IMMEDIATE ACTION Write down provider's name. List or place the following in a bag for transport with the patient: current prescription and/or nonprescription medications; alternative treatments, therapies and medications; and street drugs. ~ 08/17/2011 8:13:26PM Page 1 of 1 [id number]

## 2011-08-18 NOTE — Progress Notes (Signed)
UR complete 

## 2011-08-18 NOTE — ED Notes (Signed)
Pt using urinal at this time. Will get labs when finished.

## 2011-08-18 NOTE — Progress Notes (Signed)
PROGRESS NOTE  DANNI LEABO WUJ:[id number] DOB: 01-01-1926 DOA: 08/17/2011 PCP: Romero Belling, MD, MD  Brief narrative: Mr. Bascom is an 78 year old man with a PMH of chronic renal failure, hypertension, Alzheimer's disease, COPD, atrial fibrillation, recent treatment with antibiotics for bronchitis who was brought to the ER on 08/18/11 with the acute onset of diarrhea and dyspnea.  Upon initial evaluation, he was found to be hypotensive with marked leukocytosis.   Assessment/Plan: Principal Problem:  *Diarrhea / Sepsis /  Hypotension /  Leukocytosis /  Metabolic acidosis, normal anion gap (NAG)  Index of suspicion high for C. difficile colitis given recent antibiotic use and high white blood cell count.  Being treated empirically with Flagyl pending C. difficile studies.  Also on empiric Rocephin for the possibility of other sources of infection including the lungs and the urinary tract.  White blood cell count marginally improved this morning.  Blood cultures pending. Active Problems:  ANEMIA OF CHRONIC DISEASE  Normocytic anemia, likely of chronic disease.  MYOCARDIAL INFARCTION, HX OF / Elevated CK-MB  Troponins are negative.  No complaints of chest pain at this time.  12-lead EKG nonacute.  Cycle cardiac markers q. 8 hours x3 sets and followup two-dimensional echocardiogram to evaluate LV function.  Continue aspirin therapy.  Atrial fibrillation  Maintaining normal sinus rhythm on telemetry.  PERIPHERAL VASCULAR DISEASE  Continue aspirin.  COPD / Dyspnea / Hypoxia  No complaints of dyspnea this morning.  Oxygen saturations 98-99%.  No tachycardia. D/C VQ scan.  Continue nebulized bronchodilator therapy.  SECONDARY HYPERPARATHYROIDISM  Resume calcitriol.  Alzheimer's disease  Appears stable.  AKI (acute kidney injury) /  CKD (chronic kidney disease) stage 3, GFR 30-59 ml/min  Baseline creatinine is 1.8.  Acute renal failure likely secondary to  sepsis.  Continue gentle hydration and monitor creatinine.  Abnormal LFTs  Abdominal exam is benign.  Likely secondary to sepsis versus passive congestion of the liver from congestive heart failure.  Followup two-dimensional echocardiogram.  Will monitor LFTs.  Elevated brain natriuretic peptide (BNP) level  Chest x-ray clear with no evidence of pulmonary edema.  Lungs clear to auscultation on exam.  Suspect elevation is secondary to decreased renal clearance.  Last two-dimensional echocardiogram done 07/28/2008, poor study, but LV function appeared to be normal at that time.  Repeat two-dimensional echocardiogram pending.  Hyponatremia  Likely secondary to dehydration from GI losses.  Continue gentle IV fluids.  Hypokalemia  Secondary to GI losses.  Replete orally.  Left renal mass  Will need further evaluation to rule out underlying malignancy.  Enlarged prostate  No complaints of urinary hesitancy.  Right bundle branch block  Chronic.   Code Status: Full Family CommunicationKosisochukwu, [place] [phone] Disposition Plan: Home when stable, depending on progress  Consultants:  None  Antibiotics:  Rocephin 08/18/11--->  Flagyl 08/18/11--->  Zosyn 08/18/11--->08/18/11  Cipro 08/18/11--->08/18/11    Subjective  Mr. Ram denies dyspnea, chest pain, abdominal pain, nausea and vomiting.  He has not moved his bowels over night.   Objective    Interim History: Stable overnight.   Objective: Filed Vitals:   08/18/11 0400 08/18/11 0430 08/18/11 0500 08/18/11 0600  BP: 109/49 122/52 101/72 118/63  Pulse: 72 76 78 79  Temp:      TempSrc:      Resp: 19 19 19 17   Height:      Weight:   67.9 kg (149 lb 11.1 oz)   SpO2: 98% 97% 97% 98%    Intake/Output  Summary (Last 24 hours) at 08/18/11 0742 Last data filed at 08/18/11 0504  Gross per 24 hour  Intake    250 ml  Output      0 ml  Net    250 ml    Exam: Gen:   NAD Cardiovascular:  RRR, No M/R/G Respiratory: Lungs CTAB Gastrointestinal: Abdomen soft, NT/ND with normal active bowel sounds. Extremities: No C/E/C   Data Reviewed: Basic Metabolic Panel:  Lab 08/18/11 1610 08/17/11 2331 08/16/11 1532  NA 133* 136 135  K 3.1* 3.2* --  CL 103 106 103  CO2 19 18* 20  GLUCOSE 109* 111* 113*  BUN 43* 42* 41*  CREATININE 2.44* 2.50* 2.46*  CALCIUM 8.3* 8.4 9.4  MG 2.0 -- --  PHOS -- -- --   GFR Estimated Creatinine Clearance: 22 ml/min (by C-G formula based on Cr of 2.44). Liver Function Tests:  Lab 08/18/11 0424 08/17/11 2331  AST 85* 91*  ALT 149* 151*  ALKPHOS 413* 396*  BILITOT 2.2* 2.3*  PROT 5.9* 5.8*  ALBUMIN 2.6* 2.6*    CBC:  Lab 08/18/11 0424 08/17/11 2331 08/16/11 1532  WBC 16.7* 20.1* 22.2*  NEUTROABS -- -- --  HGB 9.9* 9.7* 11.5*  HCT 31.3* 31.1* 36.3*  MCV 94.6 95.4 95.3  PLT 92* 98* 135*   Cardiac Enzymes:  Lab 08/18/11 0424  CKTOTAL 323*  CKMB 10.9*  CKMBINDEX --  TROPONINI <0.30   BNP:  Ref. Range 08/17/2011 23:31 08/17/2011 23:33 08/18/2011 04:24  Pro B Natriuretic peptide (BNP) Latest Range: 0-450 pg/mL 3612.0 (H)  3665.0 (H)   Microbiology Recent Results (from the past 240 hour(s))  MRSA PCR SCREENING     Status: Normal   Collection Time   08/18/11  2:27 AM      Component Value Range Status Comment   MRSA by PCR NEGATIVE  NEGATIVE  Final     Procedures and Diagnostic Studies:  Ct Abdomen Pelvis Wo Contrast 08/18/2011 IMPRESSION:  1.  Mildly distended gallbladder, with suggestion of trace pericholecystic fluid and mild associated soft tissue inflammation. This is nonspecific and could reflect the patient's baseline, though mild cholecystitis could have this appearance. 2.  Diffuse distension of the colon, predominantly with air; this could reflect very mild ileus.  The small bowel remains normal in caliber.  No evidence of bowel obstruction. 3.  Diffuse calcification along the abdominal aorta and its  branches; scattered calcification along the renal arteries bilaterally, with mild bilateral renal atrophy. 4.  Mildly hyperdense 1.9 cm posterior left renal mass noted; malignancy cannot be excluded.  This could be further assessed on ultrasound, when and as deemed clinically appropriate. 5.  Borderline enlarged prostate, impressing on the base of the bladder. 6.  Diffuse diverticulosis along the distal descending and sigmoid colon, without definite evidence of diverticulitis. 7.  Mild bibasilar atelectasis noted; peribronchial thickening seen.  Original Report Authenticated By: Tonia Ghent, M.D.    Dg Chest 2 View 08/18/2011  IMPRESSION: Findings of COPD; no definite superimposed airspace consolidation seen.  Original Report Authenticated By: Tonia Ghent, M.D.    12 Lead EKG 08/18/11: Sinus rhythm with marked sinus arrhythmia; Left axis deviation; Right bundle branch block; Abnormal ECG Scheduled Meds:    . albuterol  5 mg Nebulization Once  . albuterol      . ALPRAZolam  0.25 mg Oral QHS  . aspirin EC  81 mg Oral Daily  . cefTRIAXone (ROCEPHIN)  IV  1 g Intravenous Q24H  . citalopram  20 mg Oral Daily  . hydrocortisone sod succinate (SOLU-CORTEF) injection  50 mg Intravenous Q6H  . ipratropium  0.5 mg Nebulization Once  . ipratropium  0.5 mg Nebulization Q6H  . metronidazole  500 mg Intravenous Q8H  . piperacillin-tazobactam (ZOSYN)  IV  3.375 g Intravenous Once  . potassium chloride  10 mEq Intravenous Q1 Hr x 2  . sodium chloride  3 mL Intravenous Q12H  . DISCONTD: ciprofloxacin  400 mg Intravenous Q12H   Continuous Infusions:    . sodium chloride 50 mL/hr at 08/18/11 0355      LOS: 1 day   Hillery Aldo, MD Pager [phone]  08/18/2011, 7:42 AM

## 2011-08-18 NOTE — Progress Notes (Signed)
  Echocardiogram 2D Echocardiogram has been performed.  Cathie Beams Deneen 08/18/2011, 2:29 PM

## 2011-08-19 ENCOUNTER — Encounter (HOSPITAL_COMMUNITY): Payer: Self-pay | Admitting: Internal Medicine

## 2011-08-19 ENCOUNTER — Inpatient Hospital Stay (HOSPITAL_COMMUNITY): Payer: Medicare Other

## 2011-08-19 DIAGNOSIS — I5032 Chronic diastolic (congestive) heart failure: Secondary | ICD-10-CM

## 2011-08-19 HISTORY — DX: Chronic diastolic (congestive) heart failure: I50.32

## 2011-08-19 LAB — COMPREHENSIVE METABOLIC PANEL
ALT: 122 U/L — ABNORMAL HIGH (ref 0–53)
CO2: 17 mEq/L — ABNORMAL LOW (ref 19–32)
Calcium: 8.4 mg/dL (ref 8.4–10.5)
Creatinine, Ser: 1.85 mg/dL — ABNORMAL HIGH (ref 0.50–1.35)
GFR calc Af Amer: 37 mL/min — ABNORMAL LOW (ref >90)
GFR calc non Af Amer: 32 mL/min — ABNORMAL LOW (ref >90)
Glucose, Bld: 134 mg/dL — ABNORMAL HIGH (ref 70–99)
Sodium: 136 mEq/L (ref 135–145)

## 2011-08-19 LAB — CLOSTRIDIUM DIFFICILE BY PCR: Toxigenic C. Difficile by PCR: NEGATIVE

## 2011-08-19 LAB — CBC
Hemoglobin: 9.2 g/dL — ABNORMAL LOW (ref 13.0–17.0)
MCHC: 32.1 g/dL (ref 30.0–36.0)
RDW: 13.3 % (ref 11.5–15.5)

## 2011-08-19 LAB — POTASSIUM: Potassium: 2.7 mEq/L — CL (ref 3.5–5.1)

## 2011-08-19 MED ORDER — ALBUTEROL SULFATE (5 MG/ML) 0.5% IN NEBU
2.5000 mg | INHALATION_SOLUTION | Freq: Three times a day (TID) | RESPIRATORY_TRACT | Status: DC
  Administered 2011-08-19 (×3): 2.5 mg via RESPIRATORY_TRACT
  Filled 2011-08-19 (×3): qty 0.5

## 2011-08-19 MED ORDER — TIOTROPIUM BROMIDE MONOHYDRATE 18 MCG IN CAPS
18.0000 ug | ORAL_CAPSULE | Freq: Every day | RESPIRATORY_TRACT | Status: DC
  Administered 2011-08-20 – 2011-09-06 (×17): 18 ug via RESPIRATORY_TRACT
  Filled 2011-08-19 (×4): qty 5

## 2011-08-19 MED ORDER — HYDROCORTISONE SOD SUCCINATE 100 MG IJ SOLR: 50.0000 mg | Freq: Every day | INTRAMUSCULAR | Status: DC

## 2011-08-19 MED ORDER — IPRATROPIUM BROMIDE 0.02 % IN SOLN
0.5000 mg | Freq: Three times a day (TID) | RESPIRATORY_TRACT | Status: DC
  Administered 2011-08-19 (×3): 0.5 mg via RESPIRATORY_TRACT
  Filled 2011-08-19 (×3): qty 2.5

## 2011-08-19 MED ORDER — POTASSIUM CHLORIDE 10 MEQ/100ML IV SOLN
10.0000 meq | INTRAVENOUS | Status: AC
  Administered 2011-08-19 (×2): 10 meq via INTRAVENOUS
  Filled 2011-08-19 (×2): qty 100

## 2011-08-19 MED ORDER — CEFUROXIME AXETIL 500 MG PO TABS
500.0000 mg | ORAL_TABLET | Freq: Two times a day (BID) | ORAL | Status: DC
  Administered 2011-08-19 – 2011-08-20 (×2): 500 mg via ORAL
  Filled 2011-08-19 (×3): qty 1

## 2011-08-19 MED ORDER — POTASSIUM CHLORIDE 20 MEQ/15ML (10%) PO LIQD
40.0000 meq | Freq: Once | ORAL | Status: AC
  Administered 2011-08-19: 40 meq via ORAL
  Filled 2011-08-19: qty 30

## 2011-08-19 MED ORDER — POTASSIUM CHLORIDE CRYS ER 20 MEQ PO TBCR
40.0000 meq | EXTENDED_RELEASE_TABLET | Freq: Three times a day (TID) | ORAL | Status: DC
  Administered 2011-08-19 – 2011-08-23 (×12): 40 meq via ORAL
  Filled 2011-08-19 (×14): qty 2

## 2011-08-19 MED ORDER — ALBUTEROL SULFATE (5 MG/ML) 0.5% IN NEBU
2.5000 mg | INHALATION_SOLUTION | RESPIRATORY_TRACT | Status: DC | PRN
  Administered 2011-08-28 – 2011-09-03 (×4): 2.5 mg via RESPIRATORY_TRACT
  Filled 2011-08-19 (×4): qty 0.5

## 2011-08-19 MED ORDER — POTASSIUM CHLORIDE CRYS ER 20 MEQ PO TBCR
20.0000 meq | EXTENDED_RELEASE_TABLET | Freq: Three times a day (TID) | ORAL | Status: DC
  Administered 2011-08-19: 20 meq via ORAL
  Filled 2011-08-19: qty 1

## 2011-08-19 NOTE — Plan of Care (Signed)
Problem: Phase I Progression Outcomes Goal: OOB as tolerated unless otherwise ordered Outcome: Progressing Walks to bathroom with standby assistance

## 2011-08-19 NOTE — Progress Notes (Signed)
CRITICAL VALUE ALERT  Critical value received:  Potassium 2.7   Date of notification:  08/19/11  Time of notification: 1405  Critical value read back:yes  Nurse who received alert: Auburn Bilberry   MD notified (1st page):  Yes  Time of first page:  1408  MD notified (2nd page): Not needed  Time of second page:NA  Responding MD:  Dr Darnelle Catalan  Time MD responded:  [phone]

## 2011-08-19 NOTE — Progress Notes (Signed)
At 1648 Pt transferred to 1503 with his personal belongings. Receiving nurse called and given report. Pt's vital signs within normal limits

## 2011-08-19 NOTE — Progress Notes (Signed)
PROGRESS NOTE  Brandon [name] ZOX:[id number] DOB: 07/26/1925 DOA: 08/17/2011 PCP: Romero Belling, MD, MD  Brief narrative: Mr. Harvill is an 78 year old man with a PMH of chronic renal failure, hypertension, Alzheimer's disease, COPD, atrial fibrillation, recent treatment with antibiotics for bronchitis who was brought to the ER on 08/18/11 with the acute onset of diarrhea and dyspnea.  Upon initial evaluation, he was found to be hypotensive with marked leukocytosis.  He has dramatically improved on empiric Rocephin and Flagyl.   Assessment/Plan: Principal Problem:  *Diarrhea / Sepsis /  Hypotension /  Leukocytosis /  Metabolic acidosis, normal anion gap (NAG)  Index of suspicion high for C. difficile colitis given recent antibiotic use and high white blood cell count.  Being treated empirically with Flagyl pending C. difficile studies.  Also on empiric Rocephin for the possibility of other sources of infection including the lungs and the urinary tract.  White blood cell count steadily improved since admission.  Blood cultures, C. Diff PCR pending. Active Problems:  ANEMIA OF CHRONIC DISEASE  Normocytic anemia, likely of chronic disease.  Hemoglobin 9.2-11.5 mg/dL, stable.  On Aranesp as an outpatient.  MYOCARDIAL INFARCTION, HX OF / Elevated CK-MB  Troponins were negative x 3.  No complaints of chest pain at this time.  12-lead EKG nonacute.  Two-dimensional echocardiogram showed grossly normal LV function.  Continue aspirin therapy.  Atrial fibrillation  Maintaining normal sinus rhythm on telemetry.  PERIPHERAL VASCULAR DISEASE  Continue aspirin.  COPD / Dyspnea / Hypoxia  No complaints of dyspnea this morning.  Oxygen saturations 98-100%.  No tachycardia. VQ scan discontinued.  Continue nebulized bronchodilator therapy.  SECONDARY HYPERPARATHYROIDISM  Resume calcitriol.  Alzheimer's disease  Appears stable.  AKI (acute kidney injury) /  CKD (chronic kidney  disease) stage 3, GFR 30-59 ml/min  Baseline creatinine is 1.8.  Acute renal failure likely secondary to sepsis.  Creatinine back to baseline with gentle IVF.  Hyzaar on hold.  Abnormal LFTs  Abdominal exam is benign.  Likely secondary to sepsis versus passive congestion of the liver from congestive heart failure.  Transaminases and bilirubin coming down, but alkaline phosphatase is up.  Abdominal ultrasound to evaluate gallbladder/biliary tree.  Statin on hold.  Elevated brain natriuretic peptide (BNP) level / chronic diastolic CHF  Chest x-ray clear with no evidence of pulmonary edema.  Lungs clear to auscultation on exam.  Suspect elevation is secondary to decreased renal clearance and chronic diastolic dysfunction.  Last two-dimensional echocardiogram done 07/28/2008, poor study, but LV function appeared to be normal at that time.  Repeat two-dimensional echocardiogram showed normal LV function, grade I diastolic dysfunction.  Hyponatremia  Likely secondary to dehydration from GI losses.  Resolved with gentle IV fluids.  Hypokalemia  Secondary to GI losses.  Replete orally and IV  Check magnesium.  Left renal mass  Will need further evaluation to rule out underlying malignancy.  Enlarged prostate  No complaints of urinary hesitancy.  Right bundle branch block  Chronic.   Code Status: Full Family CommunicationVinson, Brandon [name] [phone]; Daughter updated at bedside 08/19/11. Disposition Plan: Home when stable, depending on progress  Consultants:  None  Antibiotics:  Rocephin 08/18/11--->  Flagyl 08/18/11--->  Zosyn 08/18/11--->08/18/11  Cipro 08/18/11--->08/18/11    Subjective  Mr. Lina denies dyspnea, chest pain, abdominal pain, nausea and vomiting.  He has had 2-3 loose stools overnight.     Objective    Interim History: Stable overnight.   Objective: Filed Vitals:   08/19/11 0100  08/19/11 0300 08/19/11 0400  08/19/11 0500  BP: 128/57 138/56  135/66  Pulse: 78 77  85  Temp:   97.6 F (36.4 C)   TempSrc:   Oral   Resp: 21 20  23   Height:      Weight:    69 kg (152 lb 1.9 oz)  SpO2: 99% 98%  100%    Intake/Output Summary (Last 24 hours) at 08/19/11 0718 Last data filed at 08/18/11 2142  Gross per 24 hour  Intake    896 ml  Output      0 ml  Net    896 ml    Exam: Gen:  NAD Cardiovascular:  RRR, No M/R/G Respiratory: Lungs CTAB but diminished. Gastrointestinal: Abdomen soft, NT/ND with normal active bowel sounds. Extremities: No C/E/C   Data Reviewed: Basic Metabolic Panel:  Lab 08/19/11 4098 08/18/11 0424 08/17/11 2331 08/16/11 1532  NA 136 133* 136 135  K 2.5* 3.1* -- --  CL 104 103 106 103  CO2 17* 19 18* 20  GLUCOSE 134* 109* 111* 113*  BUN 44* 43* 42* 41*  CREATININE 1.85* 2.44* 2.50* 2.46*  CALCIUM 8.4 8.3* 8.4 9.4  MG -- 2.0 -- --  PHOS -- -- -- --   GFR Estimated Creatinine Clearance: 29.5 ml/min (by C-G formula based on Cr of 1.85). Liver Function Tests:  Lab 08/19/11 0335 08/18/11 0424 08/17/11 2331  AST 56* 85* 91*  ALT 122* 149* 151*  ALKPHOS 483* 413* 396*  BILITOT 1.2 2.2* 2.3*  PROT 5.5* 5.9* 5.8*  ALBUMIN 2.4* 2.6* 2.6*    CBC:  Lab 08/19/11 0335 08/18/11 0424 08/17/11 2331 08/16/11 1532  WBC 8.3 16.7* 20.1* 22.2*  NEUTROABS -- -- -- --  HGB 9.2* 9.9* 9.7* 11.5*  HCT 28.7* 31.3* 31.1* 36.3*  MCV 91.1 94.6 95.4 95.3  PLT 85* 92* 98* 135*   Cardiac Enzymes:  Lab 08/18/11 1925 08/18/11 1146 08/18/11 0424  CKTOTAL 256* 340* 323*  CKMB 14.0* 14.8* 10.9*  CKMBINDEX -- -- --  TROPONINI <0.30 <0.30 <0.30   BNP:  Ref. Range 08/17/2011 23:31 08/17/2011 23:33 08/18/2011 04:24  Pro B Natriuretic peptide (BNP) Latest Range: 0-450 pg/mL 3612.0 (H)  3665.0 (H)   Microbiology Recent Results (from the past 240 hour(s))  MRSA PCR SCREENING     Status: Normal   Collection Time   08/18/11  2:27 AM      Component Value Range Status Comment   MRSA by  PCR NEGATIVE  NEGATIVE  Final     Procedures and Diagnostic Studies:  Ct Abdomen Pelvis Wo Contrast 08/18/2011 IMPRESSION:  1.  Mildly distended gallbladder, with suggestion of trace pericholecystic fluid and mild associated soft tissue inflammation. This is nonspecific and could reflect the patient's baseline, though mild cholecystitis could have this appearance. 2.  Diffuse distension of the colon, predominantly with air; this could reflect very mild ileus.  The small bowel remains normal in caliber.  No evidence of bowel obstruction. 3.  Diffuse calcification along the abdominal aorta and its branches; scattered calcification along the renal arteries bilaterally, with mild bilateral renal atrophy. 4.  Mildly hyperdense 1.9 cm posterior left renal mass noted; malignancy cannot be excluded.  This could be further assessed on ultrasound, when and as deemed clinically appropriate. 5.  Borderline enlarged prostate, impressing on the base of the bladder. 6.  Diffuse diverticulosis along the distal descending and sigmoid colon, without definite evidence of diverticulitis. 7.  Mild bibasilar atelectasis noted; peribronchial thickening seen.  Original Report Authenticated By: Tonia Ghent, M.D.    Dg Chest 2 View 08/18/2011  IMPRESSION: Findings of COPD; no definite superimposed airspace consolidation seen.  Original Report Authenticated By: Tonia Ghent, M.D.    12 Lead EKG 08/18/11: Sinus rhythm with marked sinus arrhythmia; Left axis deviation; Right bundle branch block; Abnormal ECG   2 D Echocardiogram 08/18/11: Study Conclusions - Left ventricle: The cavity size was normal. Wall thickness was normal. Systolic function was normal. The estimated ejection fraction was in the range of 55% to 60%. Images were inadequate for LV wall motion assessment. Doppler parameters are consistent with abnormal left ventricular relaxation (grade 1 diastolic dysfunction);  Grossly normal LV systolic function. Otherwise,  this echo is not technically adequate to accurately comment on other structures.   Scheduled Meds:    . albuterol  2.5 mg Nebulization TID  . ALPRAZolam  0.25 mg Oral QHS  . aspirin EC  81 mg Oral Daily  . calcitRIOL  0.25 mcg Oral Daily  . cefTRIAXone (ROCEPHIN)  IV  1 g Intravenous Q24H  . citalopram  20 mg Oral Daily  . hydrocortisone sod succinate (SOLU-CORTEF) injection  50 mg Intravenous Q12H  . ipratropium  0.5 mg Nebulization TID  . metroNIDAZOLE  500 mg Oral Q8H  . potassium chloride  10 mEq Intravenous Q1 Hr x 2  . potassium chloride  40 mEq Oral Once  . potassium chloride  40 mEq Oral Once  . sodium chloride  3 mL Intravenous Q12H  . DISCONTD: hydrocortisone sod succinate (SOLU-CORTEF) injection  50 mg Intravenous Q6H  . DISCONTD: ipratropium  0.5 mg Nebulization Q6H  . DISCONTD: metronidazole  500 mg Intravenous Q8H   Continuous Infusions:    . sodium chloride 50 mL/hr at 08/18/11 0355      LOS: 2 days   Hillery Aldo, MD Pager [phone]  08/19/2011, 7:18 AM   Patient education given:  Patient name: Brandon [name] Hospitalist: Dr. Trula Ore Lashaundra Lehrmann Date 08/19/11  Medical Issues and Plan: Principal Problem:  *Diarrhea / Infection /  Low blood pressure /  High white blood cell count (cells that fight infection)  Index of suspicion high for infectious diarrhea given recent antibiotic use and high white blood cell count.  Being treated with Flagyl for presumed infectious diarrhea.  Also on other antibiotics (Rocephin) for the possibility of other sources of infection including the lungs and the urinary tract.  White blood cell count steadily improved since admission, and now within the normal range.  Blood tests, stool studies for infection still are pending. Active Problems: Low blood count ("anemia")  Blood cells are normal in size.  Hemoglobin 9.2-11.5 mg/dL, stable. (Normal = 13) History of heart disease  Enzyme tests specific for heart  damage are negative (tested x 3)  No complaints of chest pain.  12-lead EKG does not show any evidence of acute heart issues.  Two-dimensional echocardiogram showed that the pumping mechanism of the heart is strong, but that the heart is a little stiff.  Continue aspirin therapy. History of Irregular heart rhythm (Atrial fibrillation)  Maintaining a normal sinus rhythm on telemetry.  Poor circulation  Continue aspirin.  COPD / Shortness of breath / Low oxygen level  Oxygen saturations 98-100%. (normal)  Continue nebulized bronchodilator therapy.  SECONDARY HYPERPARATHYROIDISM  Resume calcitriol.  Alzheimer's disease  Appears stable.  AKI (acute kidney injury) /  CKD (chronic kidney disease) stage 3, GFR 30-59 ml/min  Baseline creatinine is 1.8. (this is a test that  measures kidney function)  Acute renal failure likely secondary to infection and diarrhea  Creatinine now back to baseline with IV fluids.  Abnormal liver function tests  Abdominal exam is benign.  Some enzymes coming down, but alkaline phosphatase is up.  Abdominal ultrasound to evaluate gallbladder/biliary tree.  Low potassium  Secondary to diarrhea.  GIve supplements by mouth.  Mass on the left kidney  Will need further evaluation.  Enlarged prostate  No complaints of urinary hesitancy.

## 2011-08-19 NOTE — Progress Notes (Signed)
Brandon [name] notified pt had 5bt/4bt run vtach.  Pt assymptomatic.  Pt resting in bed with no complaints.  Pt vitals:  111/74, hr 77, p8 percent RA.  Per callahn no new orders.  Will  Continue to monitor patient

## 2011-08-19 NOTE — Progress Notes (Signed)
CRITICAL VALUE ALERT  Critical value received:  Potassium 2.5  Date of notification:  08/19/11  Time of notification:  04:20  Critical value read back:yes  Nurse who received alert:  Christiane Ha. Message given to Cleotis Lema, RN  MD notified (1st page):  [facility] Schorr  Time of first page:  04:25  MD notified (2nd page):  Time of second page:  Responding MD:  [place]orr  Time MD responded:  [phone]

## 2011-08-20 DIAGNOSIS — I472 Ventricular tachycardia: Secondary | ICD-10-CM | POA: Diagnosis present

## 2011-08-20 DIAGNOSIS — N12 Tubulo-interstitial nephritis, not specified as acute or chronic: Secondary | ICD-10-CM | POA: Diagnosis present

## 2011-08-20 LAB — CBC
HCT: 33.8 % — ABNORMAL LOW (ref 39.0–52.0)
Hemoglobin: 10.9 g/dL — ABNORMAL LOW (ref 13.0–17.0)
MCH: 29.6 pg (ref 26.0–34.0)
MCHC: 32.2 g/dL (ref 30.0–36.0)
MCV: 91.8 fL (ref 78.0–100.0)

## 2011-08-20 LAB — COMPREHENSIVE METABOLIC PANEL
ALT: 115 U/L — ABNORMAL HIGH (ref 0–53)
AST: 68 U/L — ABNORMAL HIGH (ref 0–37)
Albumin: 2.7 g/dL — ABNORMAL LOW (ref 3.5–5.2)
CO2: 16 mEq/L — ABNORMAL LOW (ref 19–32)
Calcium: 9.4 mg/dL (ref 8.4–10.5)
Sodium: 139 mEq/L (ref 135–145)
Total Protein: 6.1 g/dL (ref 6.0–8.3)

## 2011-08-20 MED ORDER — ALPRAZOLAM 0.25 MG PO TABS
0.2500 mg | ORAL_TABLET | Freq: Every evening | ORAL | Status: DC | PRN
  Administered 2011-08-26 – 2011-08-31 (×2): 0.25 mg via ORAL
  Filled 2011-08-20 (×2): qty 1

## 2011-08-20 MED ORDER — CIPROFLOXACIN HCL 500 MG PO TABS
500.0000 mg | ORAL_TABLET | Freq: Two times a day (BID) | ORAL | Status: DC
  Filled 2011-08-20 (×2): qty 1

## 2011-08-20 MED ORDER — TRAMADOL-ACETAMINOPHEN 37.5-325 MG PO TABS
1.0000 | ORAL_TABLET | Freq: Two times a day (BID) | ORAL | Status: DC | PRN
  Filled 2011-08-20: qty 1

## 2011-08-20 MED ORDER — PIPERACILLIN-TAZOBACTAM 3.375 G IVPB
3.3750 g | Freq: Three times a day (TID) | INTRAVENOUS | Status: DC
  Administered 2011-08-20 – 2011-08-25 (×15): 3.375 g via INTRAVENOUS
  Filled 2011-08-20 (×18): qty 50

## 2011-08-20 MED ORDER — FUROSEMIDE 10 MG/ML IJ SOLN
40.0000 mg | Freq: Once | INTRAMUSCULAR | Status: AC
  Administered 2011-08-20: 40 mg via INTRAVENOUS
  Filled 2011-08-20: qty 4

## 2011-08-20 MED ORDER — METRONIDAZOLE 500 MG PO TABS
500.0000 mg | ORAL_TABLET | Freq: Three times a day (TID) | ORAL | Status: DC
  Filled 2011-08-20 (×3): qty 1

## 2011-08-20 MED ORDER — POTASSIUM CHLORIDE 10 MEQ/100ML IV SOLN
10.0000 meq | INTRAVENOUS | Status: AC
  Administered 2011-08-20 (×5): 10 meq via INTRAVENOUS
  Filled 2011-08-20 (×5): qty 100

## 2011-08-20 NOTE — Progress Notes (Signed)
Received report from Sherlon Handing, Charity fundraiser. No change since initial assessment. Will continue to monitor and  follow the plan of care.

## 2011-08-20 NOTE — Progress Notes (Addendum)
PROGRESS NOTE  CODY ALBUS YNW:[id number] DOB: 04-Sep-1925 DOA: 08/17/2011 PCP: Romero Belling, MD, MD  Brief narrative: Mr. Koral is an 78 year old man with a PMH of chronic renal failure, hypertension, Alzheimer's disease, COPD, atrial fibrillation, recent treatment with antibiotics for bronchitis who was brought to the ER on 08/18/11 with the acute onset of diarrhea and dyspnea.  Upon initial evaluation, he was found to be hypotensive with marked leukocytosis.  He has dramatically improved on empiric Rocephin and Flagyl.   Assessment/Plan: Principal Problem:  *Diarrhea / Sepsis /  Hypotension /  Leukocytosis /  Metabolic acidosis, normal anion gap (NAG) / Pyelonephritis  Index of suspicion initially high for C. difficile colitis given recent antibiotic use and high white blood cell count, but C. difficile PCR studies were negative. Check stool culture given ongoing diarrhea.  Metabolic acidosis likely from sepsis and bicarbonate losses in the stool. We'll check pro calcitonin and lactic acid in the morning.  Treated empirically with Flagyl until C. difficile studies came back negative.  Was also on empiric Rocephin for the possibility of other sources of infection including the lungs and the urinary tract. Unfortunately, urine cultures not checked at the time of admission. We'll check now, but likely will be negative due to having been on antibiotics.  CT scan of abdomen showed possible gallbladder inflammation as well as perinephric stranding.  Abdominal ultrasound not consistent with cholecystitis so source of infection felt to be pyelonephritis.  White blood cell count steadily improved since admission but spiked up after antibiotics narrowed to Ceftin.  The patient also developed increased chest congestion with narrowing of antibiotics.  Plan to broaden antibiotics to Zosyn to cover pneumonia, pyelonephritis and cholecystitis.  Blood cultures negative to date. Active Problems:  ANEMIA OF CHRONIC DISEASE  Normocytic anemia, likely of chronic disease.  Hemoglobin 9.2-11.5 mg/dL, stable.  On Aranesp as an outpatient.  MYOCARDIAL INFARCTION, HX OF / Elevated CK-MB  Troponins were negative x 3.  No complaints of chest pain at this time.  12-lead EKG nonacute.  Two-dimensional echocardiogram showed grossly normal LV function.  Continue aspirin therapy.  Atrial fibrillation / nonsustained ventricular tachycardia  Maintaining normal sinus rhythm on telemetry.  One episode of nonsustained ventricular tachycardia, likely exacerbated by hypokalemia. Replete potassium.  PERIPHERAL VASCULAR DISEASE  Continue aspirin.  COPD / Dyspnea / Hypoxia  No complaints of dyspnea this morning.  Oxygen saturations 98-100%.  No tachycardia. VQ scan discontinued.  Continue nebulized bronchodilator therapy.  Increased chest congestion maybe secondary to pulmonary edema versus an occult pneumonia.  Check chest x-ray in the morning if no improvement from Lasix, given today.  SECONDARY HYPERPARATHYROIDISM  Continue calcitriol.  Alzheimer's disease  Appears stable.  AKI (acute kidney injury) /  CKD (chronic kidney disease) stage 3, GFR 30-59 ml/min  Baseline creatinine is 1.8.  Acute renal failure likely secondary to sepsis.  Creatinine back to baseline with gentle IVF.  Hyzaar on hold.  Abnormal LFTs  Abdominal exam is benign.  Likely secondary to sepsis versus passive congestion of the liver from congestive heart failure.  Transaminases and bilirubin coming down, but alkaline phosphatase is up.  Abdominal ultrasound to evaluate gallbladder/biliary tree performed on 08/19/2011. No significant evidence of cholecystitis.  Statin on hold.  Elevated brain natriuretic peptide (BNP) level / chronic diastolic CHF  Chest x-ray clear with no evidence of pulmonary edema.  Lungs clear to auscultation on exam.  Suspect elevation is secondary to decreased renal  clearance and chronic diastolic dysfunction.  Last two-dimensional echocardiogram done 07/28/2008, poor study, but LV function appeared to be normal at that time.  Repeat two-dimensional echocardiogram showed normal LV function, grade I diastolic dysfunction.  Lasix 40 mg IV x1 given on 08/20/2011 for increased chest congestion.  Check pro BNP in the morning.  Hyponatremia  Likely secondary to dehydration from GI losses.  Resolved with gentle IV fluids.  Hypokalemia  Secondary to GI losses.  Replete orally and IV  Check magnesium.  Left renal mass  Consistent with a complex cyst on ultrasound.  Will need further evaluation to rule out underlying malignancy. Followup imaging recommended in 3 months.  Enlarged prostate  No complaints of urinary hesitancy.  Right bundle branch block  Chronic.   Code Status: Full Family Communication: Tyreke, [place] [phone]; wife and daughter updated at bedside 08/20/11. I spent approximately one hour with the patient and his family explaining his condition, prognosis, and plan of care. I answered all of their questions. Disposition Plan: Home when stable, depending on progress  Consultants:  None  Antibiotics:  Rocephin 08/18/11--->Ceftin 08/19/11.  Flagyl 08/18/11--->08/19/11  Zosyn 08/18/11--->08/18/11, Restarted 08/20/11---->  Cipro 08/18/11--->08/18/11    Subjective  Mr. Kleinpeter is worse today. He is weaker with more chest congestion. He continues to deny abdominal pain, but does have abdominal distention and bloating. Appetite is poor, but no nausea or vomiting. He still having loose stools.   Objective    Interim History: Stable overnight.   Objective: Filed Vitals:   08/19/11 2118 08/19/11 2210 08/20/11 0610 08/20/11 0900  BP:  144/76 133/76   Pulse:  80 88   Temp:  97.7 F (36.5 C) 98.2 F (36.8 C)   TempSrc:  Oral Oral   Resp:  18 20   Height:      Weight:      SpO2: 98% 94% 95% 94%     Intake/Output Summary (Last 24 hours) at 08/20/11 1218 Last data filed at 08/20/11 0936  Gross per 24 hour  Intake    123 ml  Output      0 ml  Net    123 ml    Exam: Gen:  NAD Cardiovascular:  RRR, No M/R/G Respiratory: Lungs diminished with occasional rhonchi Gastrointestinal: Abdomen softly distended, NT with normal active bowel sounds. Extremities: No C/E/C   Data Reviewed: Basic Metabolic Panel:  Lab 08/20/11 6578 08/19/11 1138 08/19/11 0335 08/18/11 0424 08/17/11 2331 08/16/11 1532  NA 139 -- 136 133* 136 135  K 2.7* 2.7* -- -- -- --  CL 109 -- 104 103 106 103  CO2 16* -- 17* 19 18* 20  GLUCOSE 104* -- 134* 109* 111* 113*  BUN 36* -- 44* 43* 42* 41*  CREATININE 1.56* -- 1.85* 2.44* 2.50* 2.46*  CALCIUM 9.4 -- 8.4 8.3* 8.4 9.4  MG 2.1 -- -- 2.0 -- --  PHOS -- -- -- -- -- --   GFR Estimated Creatinine Clearance: 35 ml/min (by C-G formula based on Cr of 1.56). Liver Function Tests:  Lab 08/20/11 0539 08/19/11 0335 08/18/11 0424 08/17/11 2331  AST 68* 56* 85* 91*  ALT 115* 122* 149* 151*  ALKPHOS 529* 483* 413* 396*  BILITOT 0.7 1.2 2.2* 2.3*  PROT 6.1 5.5* 5.9* 5.8*  ALBUMIN 2.7* 2.4* 2.6* 2.6*    CBC:  Lab 08/20/11 0539 08/19/11 0335 08/18/11 0424 08/17/11 2331 08/16/11 1532  WBC 15.9* 8.3 16.7* 20.1* 22.2*  NEUTROABS -- -- -- -- --  HGB 10.9* 9.2* 9.9* 9.7* 11.5*  HCT  33.8* 28.7* 31.3* 31.1* 36.3*  MCV 91.8 91.1 94.6 95.4 95.3  PLT 113* 85* 92* 98* 135*   Cardiac Enzymes:  Lab 08/18/11 1925 08/18/11 1146 08/18/11 0424  CKTOTAL 256* 340* 323*  CKMB 14.0* 14.8* 10.9*  CKMBINDEX -- -- --  TROPONINI <0.30 <0.30 <0.30   BNP:  Ref. Range 08/17/2011 23:31 08/17/2011 23:33 08/18/2011 04:24  Pro B Natriuretic peptide (BNP) Latest Range: 0-450 pg/mL 3612.0 (H)  3665.0 (H)   Microbiology Recent Results (from the past 240 hour(s))  CULTURE, BLOOD (ROUTINE X 2)     Status: Normal (Preliminary result)   Collection Time   08/18/11  1:19 AM      Component  Value Range Status Comment   Specimen Description BLOOD R FOREARM   Final    Special Requests     Final    Value: BOTTLES DRAWN AEROBIC AND ANAEROBIC 4CC AEROBIC/3CC ANAEROBIC   Culture  Setup Time [id number]   Final    Culture     Final    Value:        BLOOD CULTURE RECEIVED NO GROWTH TO DATE CULTURE WILL BE HELD FOR 5 DAYS BEFORE ISSUING A FINAL NEGATIVE REPORT   Report Status PENDING   Incomplete   CULTURE, BLOOD (ROUTINE X 2)     Status: Normal (Preliminary result)   Collection Time   08/18/11  1:22 AM      Component Value Range Status Comment   Specimen Description BLOOD RIGHT ANTECUBITAL   Final    Special Requests BOTTLES DRAWN AEROBIC ONLY 5C   Final    Culture  Setup Time [id number]   Final    Culture     Final    Value:        BLOOD CULTURE RECEIVED NO GROWTH TO DATE CULTURE WILL BE HELD FOR 5 DAYS BEFORE ISSUING A FINAL NEGATIVE REPORT   Report Status PENDING   Incomplete   MRSA PCR SCREENING     Status: Normal   Collection Time   08/18/11  2:27 AM      Component Value Range Status Comment   MRSA by PCR NEGATIVE  NEGATIVE  Final   CLOSTRIDIUM DIFFICILE BY PCR     Status: Normal   Collection Time   08/19/11  3:13 AM      Component Value Range Status Comment   C difficile by pcr NEGATIVE  NEGATIVE  Final     Procedures and Diagnostic Studies:  Ct Abdomen Pelvis Wo Contrast 08/18/2011 IMPRESSION:  1.  Mildly distended gallbladder, with suggestion of trace pericholecystic fluid and mild associated soft tissue inflammation. This is nonspecific and could reflect the patient's baseline, though mild cholecystitis could have this appearance. 2.  Diffuse distension of the colon, predominantly with air; this could reflect very mild ileus.  The small bowel remains normal in caliber.  No evidence of bowel obstruction. 3.  Diffuse calcification along the abdominal aorta and its branches; scattered calcification along the renal arteries bilaterally, with mild bilateral renal atrophy. 4.   Mildly hyperdense 1.9 cm posterior left renal mass noted; malignancy cannot be excluded.  This could be further assessed on ultrasound, when and as deemed clinically appropriate. 5.  Borderline enlarged prostate, impressing on the base of the bladder. 6.  Diffuse diverticulosis along the distal descending and sigmoid colon, without definite evidence of diverticulitis. 7.  Mild bibasilar atelectasis noted; peribronchial thickening seen.  Original Report Authenticated By: Tonia Ghent, M.D.    Dg Chest 2 View 08/18/2011  IMPRESSION: Findings of COPD; no definite superimposed airspace consolidation seen.  Original Report Authenticated By: Tonia Ghent, M.D.    12 Lead EKG 08/18/11: Sinus rhythm with marked sinus arrhythmia; Left axis deviation; Right bundle branch block; Abnormal ECG   2 D Echocardiogram 08/18/11: Study Conclusions - Left ventricle: The cavity size was normal. Wall thickness was normal. Systolic function was normal. The estimated ejection fraction was in the range of 55% to 60%. Images were inadequate for LV wall motion assessment. Doppler parameters are consistent with abnormal left ventricular relaxation (grade 1 diastolic dysfunction);  Grossly normal LV systolic function. Otherwise, this echo is not technically adequate to accurately comment on other structures.   US Abdomen Complete 08/19/2011 IMPRESSION:  1.  No shadowing gallstones are noted within gallbladder.  Mild thickening of the gallbladder wall without sonographic Murphy's sign. 2.  Normal CBD. 3.  No hydronephrosis or diagnostic renal calculus .There is a hypoechoic lesion mid pole posterior aspect of the left kidney measures 1.9 x 1.7 cm.  This corresponds to hyperdense exophytic lesion noted on CT scan.  Probable represents a complex cyst. Follow-up examination in 3 months is recommended to assure stability.  Original Report Authenticated By: Natasha Mead, M.D.   Scheduled Meds:    . ALPRAZolam  0.25 mg Oral QHS  .  aspirin EC  81 mg Oral Daily  . calcitRIOL  0.25 mcg Oral Daily  . cefUROXime  500 mg Oral BID WC  . citalopram  20 mg Oral Daily  . potassium chloride  10 mEq Intravenous Q1 Hr x 5  . potassium chloride  40 mEq Oral TID  . sodium chloride  3 mL Intravenous Q12H  . tiotropium  18 mcg Inhalation Daily  . DISCONTD: albuterol  2.5 mg Nebulization TID  . DISCONTD: cefTRIAXone (ROCEPHIN)  IV  1 g Intravenous Q24H  . DISCONTD: hydrocortisone sod succinate (SOLU-CORTEF) injection  50 mg Intravenous Daily  . DISCONTD: ipratropium  0.5 mg Nebulization TID  . DISCONTD: metroNIDAZOLE  500 mg Oral Q8H  . DISCONTD: potassium chloride  20 mEq Oral TID   Continuous Infusions:      LOS: 3 days   Hillery Aldo, MD Pager [phone]  08/20/2011, 12:18 PM   Patient education given:  Patient name: Enrique Manganaro Hospitalist: Dr. Trula Ore Jorgen Wolfinger Date 08/20/11  Medical Issues and Plan: Principal Problem:  *Kidney infection causing diarrhea /  Low blood pressure /  High white blood cell count (cells that fight infection)  CT scan showed some evidence of inflammation around the kidneys and gallbladder inflammation but no gallstones.  Flagyl stopped 08/19/2011 after tests for infectious diarrhea came back negative.  On antibiotics for treatment of kidney infection (initially Rocephin, changed to by mouth Ceftin 08/19/11).  White blood cell count was higher today, so we will change antibiotics to Zosyn to cover kidney infections, gallbladder infections, and even pneumonia.  Active Problems: Low blood count ("anemia")  Blood cells are normal in size.  Hemoglobin 9.2-11.5 mg/dL, stable. (Normal = 13).  10.9 today. History of heart disease  Enzyme tests specific for heart damage are negative (tested x 3)  No complaints of chest pain.  12-lead EKG does not show any evidence of acute heart issues.  Two-dimensional echocardiogram showed that the pumping mechanism of the heart is strong, but  that the heart is a little stiff.  Continue aspirin therapy. History of Irregular heart rhythm (Atrial fibrillation)  Maintaining a normal sinus rhythm on telemetry.  Poor circulation  Continue  aspirin.  COPD / Shortness of breath / Low oxygen level  Oxygen saturations 98-100%. (normal)  Continue nebulized bronchodilator therapy.  SECONDARY HYPERPARATHYROIDISM  Resume calcitriol.  Alzheimer's disease  Appears stable.  AKI (acute kidney injury) /  CKD (chronic kidney disease) stage 3, GFR 30-59 ml/min  Baseline creatinine is 1.8. (this is a test that measures kidney function).  Creatinine today is 1.56   Acute renal failure likely secondary to infection and diarrhea  Creatinine now back to baseline with IV fluids.  Abnormal liver function tests  Abdominal exam is benign.  Some enzymes coming down, but alkaline phosphatase is up.  This can be seen with gallbladder infections   Abdominal ultrasound  showed some gallbladder inflammation but no gallstones and clear bile ducts.  Low potassium  Secondary to diarrhea.  GIve supplements by mouth.  This is the main reason to keep you in the hospital since low potassium levels can cause irritability of the heart and heart palpitations   Mass on the left kidney   Radiologist thinks this is a complex cyst and recommends repeat imaging in 3 months   Enlarged prostate  No complaints of urinary hesitancy.

## 2011-08-20 NOTE — Progress Notes (Signed)
ANTIBIOTIC CONSULT NOTE - INITIAL  Pharmacy Consult for Zosyn Indication: pyelonephritis/cholecystitis  No Known Allergies  Patient Measurements: Height: 5\' 10"  (177.8 cm) Weight: 152 lb 1.9 oz (69 kg) IBW/kg (Calculated) : 73   Vital Signs: Temp: 98.2 F (36.8 C) (04/13 0610) Temp src: Oral (04/13 0610) BP: 133/76 mmHg (04/13 0610) Pulse Rate: 88  (04/13 0610) Intake/Output from previous day: 04/12 0701 - 04/13 0700 In: 480 [P.O.:480] Out: -  Intake/Output from this shift: Total I/O In: 3 [I.V.:3] Out: -   Labs:  Basename 08/20/11 0539 08/19/11 0335 08/18/11 0424  WBC 15.9* 8.3 16.7*  HGB 10.9* 9.2* 9.9*  PLT 113* 85* 92*  LABCREA -- -- --  CREATININE 1.56* 1.85* 2.44*   Estimated Creatinine Clearance: 35 ml/min (by C-G formula based on Cr of 1.56). No results found for this basename: VANCOTROUGH:2,VANCOPEAK:2,VANCORANDOM:2,GENTTROUGH:2,GENTPEAK:2,GENTRANDOM:2,TOBRATROUGH:2,TOBRAPEAK:2,TOBRARND:2,AMIKACINPEAK:2,AMIKACINTROU:2,AMIKACIN:2, in the last 72 hours   Microbiology: Recent Results (from the past 720 hour(s))  CULTURE, BLOOD (ROUTINE X 2)     Status: Normal (Preliminary result)   Collection Time   08/18/11  1:19 AM      Component Value Range Status Comment   Specimen Description BLOOD R FOREARM   Final    Special Requests     Final    Value: BOTTLES DRAWN AEROBIC AND ANAEROBIC 4CC AEROBIC/3CC ANAEROBIC   Culture  Setup Time [id number]   Final    Culture     Final    Value:        BLOOD CULTURE RECEIVED NO GROWTH TO DATE CULTURE WILL BE HELD FOR 5 DAYS BEFORE ISSUING A FINAL NEGATIVE REPORT   Report Status PENDING   Incomplete   CULTURE, BLOOD (ROUTINE X 2)     Status: Normal (Preliminary result)   Collection Time   08/18/11  1:22 AM      Component Value Range Status Comment   Specimen Description BLOOD RIGHT ANTECUBITAL   Final    Special Requests BOTTLES DRAWN AEROBIC ONLY 5C   Final    Culture  Setup Time [id number]   Final    Culture     Final      Value:        BLOOD CULTURE RECEIVED NO GROWTH TO DATE CULTURE WILL BE HELD FOR 5 DAYS BEFORE ISSUING A FINAL NEGATIVE REPORT   Report Status PENDING   Incomplete   MRSA PCR SCREENING     Status: Normal   Collection Time   08/18/11  2:27 AM      Component Value Range Status Comment   MRSA by PCR NEGATIVE  NEGATIVE  Final   CLOSTRIDIUM DIFFICILE BY PCR     Status: Normal   Collection Time   08/19/11  3:13 AM      Component Value Range Status Comment   C difficile by pcr NEGATIVE  NEGATIVE  Final     Medical History: Past Medical History  Diagnosis Date  . COPD (chronic obstructive pulmonary disease)   . Osteoporosis   . GERD (gastroesophageal reflux disease)   . Hypertension   . Anemia   . Arthritis   . Depression   . Cerebrovascular disease, unspecified 02/2008    Carotid U/S- right internal carotid 60-79%, left internal carotid artery 40-59%  . Secondary hyperparathyroidism (of renal origin)   . Other and unspecified hyperlipidemia   . Peripheral vascular disease, unspecified   . Atrial fibrillation   . Unspecified disorder resulting from impaired renal function   . Hiatal hernia   . Hyperkalemia  Due to ACE Inhibitors  . Varicosities     LE  . CVA (cerebral infarction)   . Lumbago   . Lumbosacral spondylosis without myelopathy   . Chronic pain syndrome   . Diverticulosis 08/18/2011  . Enlarged prostate 08/18/2011  . CKD (chronic kidney disease) stage 3, GFR 30-59 ml/min 08/18/2011  . Left renal mass 08/18/2011  . Right bundle branch block 08/18/2011  . Diastolic CHF, chronic 08/19/2011    Study Conclusions  - Left ventricle: The cavity size was normal. Wall thickness was normal. Systolic function was normal. The estimated ejection fraction was in the range of 55% to 60%. Images were inadequate for LV wall motion assessment. Doppler parameters are consistent with abnormal left ventricular relaxation (grade 1 diastolic dysfunction). - Aortic valve: Poorly visualized. -  Mitral valve: Poorly visualized. - Left atrium: Poorly visualized. - Right ventricle: Poorly visualized. Probably normal size/systolic function. - Tricuspid valve: Poorly visualized. - Pulmonic valve: Poorly visualized. - Systemic veins: IVC not visualized. Impressions:  - Grossly normal LV systolic function. Otherwise, this echo is not technically adequate to accurately comment on other structures.       Medications:  Scheduled:    . aspirin EC  81 mg Oral Daily  . calcitRIOL  0.25 mcg Oral Daily  . citalopram  20 mg Oral Daily  . furosemide  40 mg Intravenous Once  . potassium chloride  10 mEq Intravenous Q1 Hr x 5  . potassium chloride  40 mEq Oral TID  . sodium chloride  3 mL Intravenous Q12H  . tiotropium  18 mcg Inhalation Daily  . DISCONTD: albuterol  2.5 mg Nebulization TID  . DISCONTD: ALPRAZolam  0.25 mg Oral QHS  . DISCONTD: cefTRIAXone (ROCEPHIN)  IV  1 g Intravenous Q24H  . DISCONTD: cefUROXime  500 mg Oral BID WC  . DISCONTD: ciprofloxacin  500 mg Oral BID  . DISCONTD: hydrocortisone sod succinate (SOLU-CORTEF) injection  50 mg Intravenous Daily  . DISCONTD: ipratropium  0.5 mg Nebulization TID  . DISCONTD: metroNIDAZOLE  500 mg Oral Q8H  . DISCONTD: metroNIDAZOLE  500 mg Oral Q8H  . DISCONTD: potassium chloride  20 mEq Oral TID   Infusions:    Assessment: To change antibiotics to Zosyn per pharmacy dosing to cover for cholecystitis and possible pyelonephritis  Goal of Therapy:  Zosyn adjustment per renal function  Plan:  Zosyn 3.375g IV q8 (extended interval infusion) for CrCl > 20 ml/min   Hessie Knows, PharmD, BCPS Pager [phone] 08/20/2011 1:16 PM

## 2011-08-21 LAB — CBC
HCT: 36.1 % — ABNORMAL LOW (ref 39.0–52.0)
MCHC: 32.1 g/dL (ref 30.0–36.0)
Platelets: 134 10*3/uL — ABNORMAL LOW (ref 150–400)
RDW: 13.5 % (ref 11.5–15.5)
WBC: 12.3 10*3/uL — ABNORMAL HIGH (ref 4.0–10.5)

## 2011-08-21 LAB — PRO B NATRIURETIC PEPTIDE: Pro B Natriuretic peptide (BNP): 2129 pg/mL — ABNORMAL HIGH (ref 0–450)

## 2011-08-21 LAB — COMPREHENSIVE METABOLIC PANEL
ALT: 148 U/L — ABNORMAL HIGH (ref 0–53)
Alkaline Phosphatase: 522 U/L — ABNORMAL HIGH (ref 39–117)
BUN: 40 mg/dL — ABNORMAL HIGH (ref 6–23)
CO2: 15 mEq/L — ABNORMAL LOW (ref 19–32)
GFR calc Af Amer: 39 mL/min — ABNORMAL LOW (ref >90)
GFR calc non Af Amer: 34 mL/min — ABNORMAL LOW (ref >90)
Glucose, Bld: 106 mg/dL — ABNORMAL HIGH (ref 70–99)
Potassium: 3.4 mEq/L — ABNORMAL LOW (ref 3.5–5.1)
Total Protein: 6.5 g/dL (ref 6.0–8.3)

## 2011-08-21 LAB — LACTIC ACID, PLASMA: Lactic Acid, Venous: 0.7 mmol/L (ref 0.5–2.2)

## 2011-08-21 LAB — URINE CULTURE
Colony Count: NO GROWTH
Culture: NO GROWTH

## 2011-08-21 LAB — PROCALCITONIN: Procalcitonin: 0.48 ng/mL

## 2011-08-21 MED ORDER — POTASSIUM CHLORIDE 10 MEQ/100ML IV SOLN
10.0000 meq | INTRAVENOUS | Status: AC
  Administered 2011-08-21 (×2): 10 meq via INTRAVENOUS
  Filled 2011-08-21 (×2): qty 100

## 2011-08-21 MED ORDER — SODIUM BICARBONATE 650 MG PO TABS
1300.0000 mg | ORAL_TABLET | Freq: Three times a day (TID) | ORAL | Status: DC
  Administered 2011-08-21 – 2011-08-22 (×4): 1300 mg via ORAL
  Filled 2011-08-21 (×6): qty 2

## 2011-08-21 NOTE — Evaluation (Addendum)
Physical Therapy Evaluation Patient Details Name: Brandon [name] MRN: [id number] DOB: 1925-09-10 Today's Date: 08/21/2011  Problem List:  Patient Active Problem List  Diagnoses  . Unspecified disorder of thyroid  . DYSLIPIDEMIA  . HYPERKALEMIA  . ANEMIA, IRON DEFICIENCY  . ANEMIA OF CHRONIC DISEASE  . UNSPECIFIED ANEMIA  . DEPRESSION  . HYPERTENSION  . MYOCARDIAL INFARCTION, HX OF  . Atrial fibrillation  . CEREBROVASCULAR DISEASE  . PERIPHERAL VASCULAR DISEASE  . COPD  . GERD  . HIATAL HERNIA  . ILEUS  . SECONDARY HYPERPARATHYROIDISM  . RENAL INSUFFICIENCY  . Bladder neck obstruction  . PSORIASIS  . HIP PAIN, BILATERAL  . ARTHRITIS, SPINE  . OSTEOPOROSIS  . Edema  . LOSS OF APPETITE  . WEIGHT LOSS  . Diarrhea  . HYPERGLYCEMIA  . DOE (dyspnea on exertion)  . Alzheimer's disease  . Lumbar disc disease  . Lumbago  . Balance disorder  . Encounter for long-term (current) use of other medications  . Special screening for malignant neoplasm of prostate  . AKI (acute kidney injury)  . Abnormal LFTs  . Hypotension  . Leukocytosis  . Metabolic acidosis, normal anion gap (NAG)  . Elevated CK-MB level  . Elevated brain natriuretic peptide (BNP) level  . Hyponatremia  . Hypokalemia  . CKD (chronic kidney disease) stage 3, GFR 30-59 ml/min  . Normocytic anemia  . Left renal mass  . Enlarged prostate  . Diverticulosis  . Right bundle branch block  . Dyspnea  . Hypoxia  . Diastolic CHF, chronic  . NSVT (nonsustained ventricular tachycardia)  . Pyelonephritis    Past Medical History:  Past Medical History  Diagnosis Date  . COPD (chronic obstructive pulmonary disease)   . Osteoporosis   . GERD (gastroesophageal reflux disease)   . Hypertension   . Anemia   . Arthritis   . Depression   . Cerebrovascular disease, unspecified 02/2008    Carotid U/S- right internal carotid 60-79%, left internal carotid artery 40-59%  . Secondary hyperparathyroidism (of renal  origin)   . Other and unspecified hyperlipidemia   . Peripheral vascular disease, unspecified   . Atrial fibrillation   . Unspecified disorder resulting from impaired renal function   . Hiatal hernia   . Hyperkalemia     Due to ACE Inhibitors  . Varicosities     LE  . CVA (cerebral infarction)   . Lumbago   . Lumbosacral spondylosis without myelopathy   . Chronic pain syndrome   . Diverticulosis 08/18/2011  . Enlarged prostate 08/18/2011  . CKD (chronic kidney disease) stage 3, GFR 30-59 ml/min 08/18/2011  . Left renal mass 08/18/2011  . Right bundle branch block 08/18/2011  . Diastolic CHF, chronic 08/19/2011    Study Conclusions  - Left ventricle: The cavity size was normal. Wall thickness was normal. Systolic function was normal. The estimated ejection fraction was in the range of 55% to 60%. Images were inadequate for LV wall motion assessment. Doppler parameters are consistent with abnormal left ventricular relaxation (grade 1 diastolic dysfunction). - Aortic valve: Poorly visualized. - Mitral valve: Poorly visualized. - Left atrium: Poorly visualized. - Right ventricle: Poorly visualized. Probably normal size/systolic function. - Tricuspid valve: Poorly visualized. - Pulmonic valve: Poorly visualized. - Systemic veins: IVC not visualized. Impressions:  - Grossly normal LV systolic function. Otherwise, this echo is not technically adequate to accurately comment on other structures.      Past Surgical History:  Past Surgical History  Procedure Date  .  Lumbar fusion 01/2008    PT Assessment/Plan/Recommendation PT Assessment Clinical Impression Statement: Pt presents with diagnosis of pyelonephritis. Hx of Alz dementia. Wife not present for eval. Pt appears slightly confused intermittently. Pt currently demonstrating general weakness, decreased activity tolerance, and impaired balance. Pt will benefit from skilled PT in acute setting to maximize independence and safety in preparation for  d/c. Will need to check with wife to confirm if she is able to assist pt.  PT Recommendation/Assessment: Patient will need skilled PT in the acute care venue PT Problem List: Decreased strength;Decreased activity tolerance;Decreased balance;Decreased mobility;Decreased knowledge of use of DME;Decreased cognition;Decreased safety awareness PT Therapy Diagnosis : Difficulty walking;Generalized weakness PT Plan PT Frequency: Min 3X/week PT Treatment/Interventions: DME instruction;Gait training;Functional mobility training;Therapeutic activities;Therapeutic exercise;Balance training;Patient/family education PT Recommendation Recommendations for Other Services: OT consult Follow Up Recommendations: Home health PT;Supervision/Assistance - 24 hour vs SNF (depending on progress and wife's ability to provide assist) Equipment Recommended: Rolling walker with 5" wheels (if pt does not have one already) PT Goals  Acute Rehab PT Goals PT Goal Formulation: With patient Time For Goal Achievement: 2 weeks Pt will go Supine/Side to Sit: Independently PT Goal: Supine/Side to Sit - Progress: Goal set today Pt will go Sit to Supine/Side: Independently PT Goal: Sit to Supine/Side - Progress: Goal set today Pt will go Sit to Stand: with supervision PT Goal: Sit to Stand - Progress: Goal set today Pt will go Stand to Sit: with supervision PT Goal: Stand to Sit - Progress: Goal set today Pt will Ambulate: 51 - 150 feet;with least restrictive assistive device PT Goal: Ambulate - Progress: Goal set today Pt will Go Up / Down Stairs: 3-5 stairs;with rail(s);with least restrictive assistive device PT Goal: Up/Down Stairs - Progress: Goal set today  PT Evaluation Precautions/Restrictions  Precautions Precautions: Fall Restrictions Weight Bearing Restrictions: No Prior Functioning  Home Living Lives With: Spouse Type of Home: House Home Access: Stairs to enter Secretary/administrator of Steps: 5 Entrance  Stairs-Rails: Right Home Layout: Multi-level;Bed/bath upstairs Alternate Level Stairs-Number of Steps: 13 Alternate Level Stairs-Rails: Right Home Adaptive Equipment: Walker - rolling;Straight cane Prior Function Level of Independence: Independent with assistive device(s) Able to Take Stairs?: Yes Cognition Cognition Arousal/Alertness: Awake/alert Overall Cognitive Status: History of cognitive impairments History of Cognitive Impairment: Appears at baseline functioning? Orientation Level: Disoriented to time Sensation/Coordination Sensation Light Touch: Appears Intact Coordination Gross Motor Movements are Fluid and Coordinated: Yes Extremity Assessment RLE Strength RLE Overall Strength Comments: At least 4/5 with functional activity LLE Strength LLE Overall Strength Comments: At least 4/5 with functional activity Mobility (including Balance) Bed Mobility Bed Mobility: Yes Supine to Sit: 6: Modified independent (Device/Increase time) Sit to Supine: 6: Modified independent (Device/Increase time) Transfers Transfers: Yes Sit to Stand: 4: Min assist;With upper extremity assist;From bed;From chair/3-in-1 Sit to Stand Details (indicate cue type and reason): VCs safety, hand placement. Assist to rise, stabilize.  Stand to Sit: To bed;To chair/3-in-1;4: Min assist Stand to Sit Details: VCs safety, hand placement. Assist to control descent Ambulation/Gait Ambulation/Gait: Yes Ambulation/Gait Assistance: 4: Min assist Ambulation/Gait Assistance Details (indicate cue type and reason): Ambulated into bathroom with straight cane-pt unsteady and reaching out for objects to hold onto. For hallway ambulation, used RW-pt more steady but still required intermittent assist to steady. Fatigues easily. Seated rest break needed before walking into hallway.  Ambulation Distance (Feet): 75 Feet (15'x1, 75'x1) Assistive device: Rolling walker;Straight cane Gait Pattern: Step-through pattern;Trunk  flexed;Decreased stride length    Exercise  End of Session PT - End of Session Equipment Utilized During Treatment: Gait belt Activity Tolerance: Patient limited by fatigue Patient left: in bed;with call bell in reach General Behavior During Session: [name] for tasks performed Cognition: Impaired, at baseline [name] [facility] 08/21/2011, 9:59 AM [phone]

## 2011-08-21 NOTE — Progress Notes (Signed)
PROGRESS NOTE  DONTEL HARSHBERGER ZOX:[id number] DOB: Oct 05, 1925 DOA: 08/17/2011 PCP: Romero Belling, MD, MD  Brief narrative: Brandon [name] is an 78 year old man with a PMH of chronic renal failure, hypertension, Alzheimer's disease, COPD, atrial fibrillation, recent treatment with antibiotics for bronchitis who was brought to the ER on 08/18/11 with the acute onset of diarrhea and dyspnea.  Upon initial evaluation, he was found to be hypotensive with marked leukocytosis.  He has dramatically improved on empiric Rocephin and Flagyl.   Assessment/Plan: Principal Problem:  *Diarrhea / Sepsis /  Hypotension /  Leukocytosis /  Metabolic acidosis, normal anion gap (NAG) / Pyelonephritis  Index of suspicion initially high for C. difficile colitis given recent antibiotic use and high white blood cell count, but C. difficile PCR studies were negative. Stool cultures also ordered with results pending.  Metabolic acidosis likely from sepsis and bicarbonate losses in the stool. Started on oral bicarbonate replacement therapy.  Pro calcitonin and lactic acid not elevated on 08/21/11.  Initially treated empirically with Flagyl until C. difficile studies came back negative.  Was also on initially on empiric Rocephin for the possibility of other sources of infection including the lungs and the urinary tract. Unfortunately, urine cultures not checked at the time of admission. Cultures sent on 08/20/11, but likely will be negative due to having been on antibiotics.  CT scan of abdomen showed possible gallbladder inflammation as well as perinephric stranding.  Abdominal ultrasound not consistent with cholecystitis so source of infection felt to be pyelonephritis.  White blood cell count steadily improved since admission but spiked up after antibiotics narrowed to Ceftin, making cholecystitis a higher possibility on the differential.  The patient also developed increased chest congestion with narrowing of  antibiotics.  Antibiotics broadened to Zosyn on 08/20/11 to cover pneumonia, pyelonephritis and cholecystitis.  WBC trending down again with change.  Blood cultures negative to date. Active Problems:  ANEMIA OF CHRONIC DISEASE  Normocytic anemia, likely of chronic disease.  Hemoglobin 9.2-11.5 mg/dL, stable.  On Aranesp as an outpatient.  MYOCARDIAL INFARCTION, HX OF / Elevated CK-MB  Troponins were negative x 3.  No complaints of chest pain at this time.  12-lead EKG nonacute.  Two-dimensional echocardiogram showed grossly normal LV function.  Continue aspirin therapy.  Atrial fibrillation / nonsustained ventricular tachycardia  Maintaining normal sinus rhythm on telemetry.  One episode of nonsustained ventricular tachycardia, likely exacerbated by hypokalemia. Repleting potassium.  PERIPHERAL VASCULAR DISEASE  Continue aspirin.  COPD / Dyspnea / Hypoxia  Oxygen saturations 96-100%.  Continue nebulized bronchodilator therapy.  Increased chest congestion noted 08/20/11 felt to be secondary to pulmonary edema, pro-BNP was elevated.  Responded to Lasix given 08/20/11.  SECONDARY HYPERPARATHYROIDISM  Continue calcitriol.  Alzheimer's disease  Appears stable.  AKI (acute kidney injury) /  CKD (chronic kidney disease) stage 3, GFR 30-59 ml/min  Baseline creatinine is 1.8.  Acute renal failure likely secondary to sepsis.  Creatinine back to baseline with gentle IVF.  No further IVF given pulmonary edema.  Hyzaar on hold.  Abnormal LFTs  Abdominal exam is benign.  Likely secondary to sepsis versus passive congestion of the liver from congestive heart failure.  Transaminases and alkaline phosphatase still markedly abnormal.  Abdominal ultrasound to evaluate gallbladder/biliary tree performed on 08/19/2011. No significant evidence of cholecystitis.  Statin on hold.  If LFTs continue to rise, consider GI consult.  Elevated brain natriuretic peptide (BNP) level /  chronic diastolic CHF  Chest x-ray clear with no evidence of  pulmonary edema on admission.  Lungs clear to auscultation on exam on admission.  Suspect initial elevation of pro-BNP secondary to decreased renal clearance and chronic diastolic dysfunction.  Last two-dimensional echocardiogram done 07/28/2008, poor study, but LV function appeared to be normal at that time.  Repeat two-dimensional echocardiogram showed normal LV function, grade I diastolic dysfunction.  Lasix 40 mg IV x1 given on 08/20/2011 for increased chest congestion.    Hyponatremia  Likely secondary to dehydration from GI losses.  Resolved with gentle IV fluids.  Hypokalemia  Secondary to GI losses.  Repleting orally and intravenously.  Should improve with decreasing diarrhea.  Magnesium checked 08/20/11, and was normal.  Left renal mass  Consistent with a complex cyst on ultrasound.  Will need further evaluation to rule out underlying malignancy. Followup imaging recommended in 3 months.  Enlarged prostate  No complaints of urinary hesitancy.  Right bundle branch block  Chronic.   Code Status: Full Family Communication: Kym[place], [place] [phone]; wife updated at bedside 08/21/11. Disposition Plan: Home when stable, depending on progress  Consultants:  None  Antibiotics:  Rocephin 08/18/11--->Ceftin 08/19/11.  Flagyl 08/18/11--->08/19/11  Zosyn 08/18/11--->08/18/11, Restarted 08/20/11---->  Cipro 08/18/11--->08/18/11    Subjective  Mr. Grieco is slightly better today.  No frank dyspnea.  No cough.  Diarrhea beginning to improve.  Appetite has been poor.   Objective    Interim History: Stable overnight.   Objective: Filed Vitals:   08/20/11 1423 08/20/11 2200 08/21/11 0600 08/21/11 0819  BP: 154/70 137/71 160/93   Pulse: 82 90 88   Temp: 97.5 F (36.4 C) 97.7 F (36.5 C) 98.5 F (36.9 C)   TempSrc: Oral Oral Oral   Resp: 22 20 20    Height:      Weight:   66.225 kg  (146 lb)   SpO2: 100% 97% 96% 97%    Intake/Output Summary (Last 24 hours) at 08/21/11 1124 Last data filed at 08/20/11 1924  Gross per 24 hour  Intake    240 ml  Output      0 ml  Net    240 ml    Exam: Gen:  NAD Cardiovascular:  RRR, No M/R/G Respiratory: Lungs diminished with occasional rhonchi Gastrointestinal: Abdomen softly distended, NT with normal active bowel sounds. Extremities: No C/E/C   Data Reviewed: Basic Metabolic Panel:  Lab 08/21/11 2956 08/20/11 0539 08/19/11 0335 08/18/11 0424 08/17/11 2331  NA 143 139 136 133* 136  K 3.4* 2.7* -- -- --  CL 114* 109 104 103 106  CO2 15* 16* 17* 19 18*  GLUCOSE 106* 104* 134* 109* 111*  BUN 40* 36* 44* 43* 42*  CREATININE 1.77* 1.56* 1.85* 2.44* 2.50*  CALCIUM 9.7 9.4 8.4 8.3* 8.4  MG -- 2.1 -- 2.0 --  PHOS -- -- -- -- --   GFR Estimated Creatinine Clearance: 29.6 ml/min (by C-G formula based on Cr of 1.77). Liver Function Tests:  Lab 08/21/11 0545 08/20/11 0539 08/19/11 0335 08/18/11 0424 08/17/11 2331  AST 139* 68* 56* 85* 91*  ALT 148* 115* 122* 149* 151*  ALKPHOS 522* 529* 483* 413* 396*  BILITOT 0.7 0.7 1.2 2.2* 2.3*  PROT 6.5 6.1 5.5* 5.9* 5.8*  ALBUMIN 3.0* 2.7* 2.4* 2.6* 2.6*    CBC:  Lab 08/21/11 0545 08/20/11 0539 08/19/11 0335 08/18/11 0424 08/17/11 2331  WBC 12.3* 15.9* 8.3 16.7* 20.1*  NEUTROABS -- -- -- -- --  HGB 11.6* 10.9* 9.2* 9.9* 9.7*  HCT 36.1* 33.8* 28.7* 31.3* 31.1*  MCV 91.9 91.8 91.1 94.6 95.4  PLT 134* 113* 85* 92* 98*   Cardiac Enzymes:  Lab 08/18/11 1925 08/18/11 1146 08/18/11 0424  CKTOTAL 256* 340* 323*  CKMB 14.0* 14.8* 10.9*  CKMBINDEX -- -- --  TROPONINI <0.30 <0.30 <0.30   BNP:  Ref. Range 08/17/2011 23:31 08/18/2011 04:24 08/21/2011 05:45  Pro B Natriuretic peptide (BNP) Latest Range: 0-450 pg/mL 3612.0 (H) 3665.0 (H) 2129.0 (H)   Sepsis Evaluation:  Ref. Range 08/21/2011 05:45  Lactic Acid, Venous Latest Range: 0.5-2.2 mmol/L 0.7  Procalcitonin No range found  0.48   Microbiology Recent Results (from the past 240 hour(s))  CULTURE, BLOOD (ROUTINE X 2)     Status: Normal (Preliminary result)   Collection Time   08/18/11  1:19 AM      Component Value Range Status Comment   Specimen Description BLOOD R FOREARM   Final    Special Requests     Final    Value: BOTTLES DRAWN AEROBIC AND ANAEROBIC 4CC AEROBIC/3CC ANAEROBIC   Culture  Setup Time [id number]   Final    Culture     Final    Value:        BLOOD CULTURE RECEIVED NO GROWTH TO DATE CULTURE WILL BE HELD FOR 5 DAYS BEFORE ISSUING A FINAL NEGATIVE REPORT   Report Status PENDING   Incomplete   CULTURE, BLOOD (ROUTINE X 2)     Status: Normal (Preliminary result)   Collection Time   08/18/11  1:22 AM      Component Value Range Status Comment   Specimen Description BLOOD RIGHT ANTECUBITAL   Final    Special Requests BOTTLES DRAWN AEROBIC ONLY 5C   Final    Culture  Setup Time [id number]   Final    Culture     Final    Value:        BLOOD CULTURE RECEIVED NO GROWTH TO DATE CULTURE WILL BE HELD FOR 5 DAYS BEFORE ISSUING A FINAL NEGATIVE REPORT   Report Status PENDING   Incomplete   MRSA PCR SCREENING     Status: Normal   Collection Time   08/18/11  2:27 AM      Component Value Range Status Comment   MRSA by PCR NEGATIVE  NEGATIVE  Final   CLOSTRIDIUM DIFFICILE BY PCR     Status: Normal   Collection Time   08/19/11  3:13 AM      Component Value Range Status Comment   C difficile by pcr NEGATIVE  NEGATIVE  Final     Procedures and Diagnostic Studies:  Ct Abdomen Pelvis Wo Contrast 08/18/2011 IMPRESSION:  1.  Mildly distended gallbladder, with suggestion of trace pericholecystic fluid and mild associated soft tissue inflammation. This is nonspecific and could reflect the patient's baseline, though mild cholecystitis could have this appearance. 2.  Diffuse distension of the colon, predominantly with air; this could reflect very mild ileus.  The small bowel remains normal in caliber.  No evidence  of bowel obstruction. 3.  Diffuse calcification along the abdominal aorta and its branches; scattered calcification along the renal arteries bilaterally, with mild bilateral renal atrophy. 4.  Mildly hyperdense 1.9 cm posterior left renal mass noted; malignancy cannot be excluded.  This could be further assessed on ultrasound, when and as deemed clinically appropriate. 5.  Borderline enlarged prostate, impressing on the base of the bladder. 6.  Diffuse diverticulosis along the distal descending and sigmoid colon, without definite evidence of diverticulitis. 7.  Mild bibasilar atelectasis noted;  peribronchial thickening seen.  Original Report Authenticated By: Tonia Ghent, M.D.    Dg Chest 2 View 08/18/2011  IMPRESSION: Findings of COPD; no definite superimposed airspace consolidation seen.  Original Report Authenticated By: Tonia Ghent, M.D.    12 Lead EKG 08/18/11: Sinus rhythm with marked sinus arrhythmia; Left axis deviation; Right bundle branch block; Abnormal ECG   2 D Echocardiogram 08/18/11: Study Conclusions - Left ventricle: The cavity size was normal. Wall thickness was normal. Systolic function was normal. The estimated ejection fraction was in the range of 55% to 60%. Images were inadequate for LV wall motion assessment. Doppler parameters are consistent with abnormal left ventricular relaxation (grade 1 diastolic dysfunction);  Grossly normal LV systolic function. Otherwise, this echo is not technically adequate to accurately comment on other structures.   US Abdomen Complete 08/19/2011 IMPRESSION:  1.  No shadowing gallstones are noted within gallbladder.  Mild thickening of the gallbladder wall without sonographic Murphy's sign. 2.  Normal CBD. 3.  No hydronephrosis or diagnostic renal calculus .There is a hypoechoic lesion mid pole posterior aspect of the left kidney measures 1.9 x 1.7 cm.  This corresponds to hyperdense exophytic lesion noted on CT scan.  Probable represents a  complex cyst. Follow-up examination in 3 months is recommended to assure stability.  Original Report Authenticated By: Natasha Mead, M.D.   Scheduled Meds:    . aspirin EC  81 mg Oral Daily  . calcitRIOL  0.25 mcg Oral Daily  . citalopram  20 mg Oral Daily  . furosemide  40 mg Intravenous Once  . piperacillin-tazobactam (ZOSYN)  IV  3.375 g Intravenous Q8H  . potassium chloride  10 mEq Intravenous Q1 Hr x 5  . potassium chloride  10 mEq Intravenous Q1 Hr x 2  . potassium chloride  40 mEq Oral TID  . sodium bicarbonate  1,300 mg Oral TID  . sodium chloride  3 mL Intravenous Q12H  . tiotropium  18 mcg Inhalation Daily  . DISCONTD: ALPRAZolam  0.25 mg Oral QHS  . DISCONTD: cefUROXime  500 mg Oral BID WC  . DISCONTD: ciprofloxacin  500 mg Oral BID  . DISCONTD: metroNIDAZOLE  500 mg Oral Q8H   Continuous Infusions:      LOS: 4 days   Hillery Aldo, MD Pager [phone]  08/21/2011, 11:24 AM   Patient education given:  Patient name: Brandon [name] Hospitalist: Dr. Trula Ore Keatyn Luck Date 08/21/11  Medical Issues and Plan: Principal Problem:  *Kidney infection causing diarrhea /  Low blood pressure /  High white blood cell count (cells that fight infection) / Acidic blood  CT scan showed some evidence of inflammation around the kidneys and gallbladder inflammation but no gallstones.  Flagyl stopped 08/19/2011 after tests for infectious diarrhea came back negative.  On antibiotics for treatment of kidney infection (initially Rocephin, changed to by mouth Ceftin 08/19/11).  White blood cell count was higher 08/20/11, so we changed antibiotics to Zosyn to cover kidney infections, gallbladder infections, and even pneumonia.  White blood cells were 20.1 on admission, dropped down to 8.3 on 08/19/11 then up to 15.9 08/20/11 and today they are 12.3.  Normal is less than 10.  Acidic blood can make you feel bad, the acids go up with bad cases of diarrhea, due to "bicarbonate" losses in the  stool.  Oral bicarbonate replacement ordered on 08/21/11.  Active Problems: Low blood count ("anemia")  Blood cells are normal in size.  Hemoglobin 9.2-11.5 mg/dL, stable. (Normal = 13).  11.6 today. History of heart disease  Enzyme tests specific for heart damage are negative (tested x 3)  No complaints of chest pain.  12-lead EKG does not show any evidence of acute heart issues.  Two-dimensional echocardiogram showed that the pumping mechanism of the heart is strong, but that the heart is a little stiff.  Continue aspirin therapy. History of Irregular heart rhythm (Atrial fibrillation)  Maintaining a normal sinus rhythm on telemetry.  Poor circulation  Continue aspirin.  COPD / Shortness of breath / Low oxygen level  Oxygen saturations 98-100%. (normal)  Continue nebulized bronchodilator therapy.  SECONDARY HYPERPARATHYROIDISM  Resume calcitriol.  AKI (acute kidney injury) /  CKD (chronic kidney disease) stage 3, GFR 30-59 ml/min  Baseline creatinine is 1.8. (this is a test that measures kidney function).  Creatinine today is 1.77, around baseline values.   Acute renal failure likely secondary to infection and diarrhea  Creatinine now back to baseline with IV fluids.  Abnormal liver function tests  Abdominal exam is benign.  Some enzymes coming down, but alkaline phosphatase is up.  This can be seen with gallbladder infections   Abdominal ultrasound  showed some gallbladder inflammation but no gallstones and clear bile ducts.  Low potassium  Secondary to diarrhea.  Improving with supplements by mouth. (2.7 yesterday, 3.4 today, normal is 3.5-4.9.  Low potassium levels can cause irritability of the heart and heart palpitations   Mass on the left kidney   Radiologist thinks this is a complex cyst and recommends repeat imaging in 3 months   Enlarged prostate  No complaints of urinary hesitancy.

## 2011-08-21 NOTE — Progress Notes (Signed)
OT Cancellation Note  Treatment cancelled today due to:  Pt declining OT evaluation x2 attempts stating "I need to rest". Pt agreeable to evaluation tomorrow 08-22-11. Ot to continue to follow acutely   Lucile Shutters   OTR/L Pager: [phone] Office: [phone] .

## 2011-08-21 NOTE — Plan of Care (Signed)
Problem: Phase II Progression Outcomes Goal: Progress activity as tolerated unless otherwise ordered Outcome: Progressing Up to bsc with 1 assist  Goal: Discharge plan established Outcome: Progressing Plans to go home with wife at this time

## 2011-08-22 ENCOUNTER — Inpatient Hospital Stay (HOSPITAL_COMMUNITY): Payer: Medicare Other

## 2011-08-22 DIAGNOSIS — R197 Diarrhea, unspecified: Secondary | ICD-10-CM

## 2011-08-22 DIAGNOSIS — R932 Abnormal findings on diagnostic imaging of liver and biliary tract: Secondary | ICD-10-CM

## 2011-08-22 DIAGNOSIS — R7989 Other specified abnormal findings of blood chemistry: Secondary | ICD-10-CM

## 2011-08-22 DIAGNOSIS — K560 Paralytic ileus: Secondary | ICD-10-CM

## 2011-08-22 DIAGNOSIS — D72829 Elevated white blood cell count, unspecified: Secondary | ICD-10-CM

## 2011-08-22 LAB — CBC
HCT: 36.3 % — ABNORMAL LOW (ref 39.0–52.0)
Hemoglobin: 11.6 g/dL — ABNORMAL LOW (ref 13.0–17.0)
MCH: 29.9 pg (ref 26.0–34.0)
MCV: 93.6 fL (ref 78.0–100.0)
Platelets: 142 10*3/uL — ABNORMAL LOW (ref 150–400)
RBC: 3.88 MIL/uL — ABNORMAL LOW (ref 4.22–5.81)
WBC: 18.1 10*3/uL — ABNORMAL HIGH (ref 4.0–10.5)

## 2011-08-22 LAB — COMPREHENSIVE METABOLIC PANEL
AST: 111 U/L — ABNORMAL HIGH (ref 0–37)
Albumin: 3.1 g/dL — ABNORMAL LOW (ref 3.5–5.2)
BUN: 44 mg/dL — ABNORMAL HIGH (ref 6–23)
Calcium: 9.7 mg/dL (ref 8.4–10.5)
Chloride: 116 mEq/L — ABNORMAL HIGH (ref 96–112)
Creatinine, Ser: 1.93 mg/dL — ABNORMAL HIGH (ref 0.50–1.35)
Total Bilirubin: 0.7 mg/dL (ref 0.3–1.2)
Total Protein: 6.7 g/dL (ref 6.0–8.3)

## 2011-08-22 MED ORDER — SODIUM BICARBONATE 8.4 % IV SOLN
INTRAVENOUS | Status: DC
  Filled 2011-08-22 (×3): qty 150

## 2011-08-22 MED ORDER — VANCOMYCIN HCL IN DEXTROSE 1-5 GM/200ML-% IV SOLN
1000.0000 mg | INTRAVENOUS | Status: DC
  Administered 2011-08-22 – 2011-08-25 (×4): 1000 mg via INTRAVENOUS
  Filled 2011-08-22 (×5): qty 200

## 2011-08-22 MED ORDER — SODIUM BICARBONATE 8.4 % IV SOLN
INTRAVENOUS | Status: DC
  Administered 2011-08-22 – 2011-08-25 (×5): via INTRAVENOUS
  Filled 2011-08-22 (×14): qty 1000

## 2011-08-22 NOTE — Consult Note (Signed)
Referring Provider: Dr. Elvera Lennox amaPrimary Care Physician:  Romero Belling, MD, MD Primary Gastroenterologist:  Dr.Gessner  Reason for Consultation:  Abnormal liver function studies, question cholecystitis  HPI: Brandon [name] is a 78 y.o. male with multiple medical problems. Is a primary patient of Dr. Everardo All. He carries diagnoses of COPD, Alzheimer's dementia, chronic renal failure with baseline creatinine of about 1.8, peripheral vascular disease, coronary artery disease, and history of atrial fibrillation. He also has history of anemia of chronic disease and has been maintained on Aranesp.  Patient was admitted on 08/18/2011 after coming to the emergency room with complaints of three-day history of severe watery diarrhea increased complaints of shortness of breath and was found to be hypotensive with a significant leukocytosis. It was felt that he was probably septic. He had been on a Z-Pak recently at high and initially felt high risk for C. difficile however C. difficile PCR was done and was negative. He was covered initially with Rocephin and Flagyl. He also had an abnormal U and A on admission however culture was not done prior to starting antibiotics and culture subsequently negative. Blood cultures have also been negative. His antibiotics were narrowed for cultures came back negative he was placed on Ceftin and within 24 hours had a rise again in his leukocytosis. He was started on Zosyn on 4/13 and has had a stable white count with some improvement over the past 48 hours.  He is also had elevated LFTs since admission which have been relatively stable. Abdominal ultrasound was done on 08/19/2011 with finding of mild gallbladder wall thickening with gallbladder wall at 3.6 mm no definite stones seen there was no evidence of hydronephrosis.  CT scan of the abdomen and pelvis was done on 08/18/2011 showing a mildly distended gallbladder with no definite stones there was trace pericholecystic fluid  noted as well as bilateral perinephric stranding and a left renal mass measuring 1.9 cm. He was also noted to have a mild colonic ileus.  Patient is a poor historian however he states that he has not had any abdominal pain since onset of this illness- just diarrhea. He is continuing to have diarrhea and has had 3 loose bowel movements today thus far. He does admit to feeling distended and has no appetite. He denies any nausea or vomiting. His son who is at bedside says that he is weak and has been very sleepy. He also complains of dyspnea and says that this persists and is not his baseline.   Past Medical History  Diagnosis Date  . COPD (chronic obstructive pulmonary disease)   . Osteoporosis   . GERD (gastroesophageal reflux disease)   . Hypertension   . Anemia   . Arthritis   . Depression   . Cerebrovascular disease, unspecified 02/2008    Carotid U/S- right internal carotid 60-79%, left internal carotid artery 40-59%  . Secondary hyperparathyroidism (of renal origin)   . Other and unspecified hyperlipidemia   . Peripheral vascular disease, unspecified   . Atrial fibrillation   . Unspecified disorder resulting from impaired renal function   . Hiatal hernia   . Hyperkalemia     Due to ACE Inhibitors  . Varicosities     LE  . CVA (cerebral infarction)   . Lumbago   . Lumbosacral spondylosis without myelopathy   . Chronic pain syndrome   . Diverticulosis 08/18/2011  . Enlarged prostate 08/18/2011  . CKD (chronic kidney disease) stage 3, GFR 30-59 ml/min 08/18/2011  . Left renal mass  08/18/2011  . Right bundle branch block 08/18/2011  . Diastolic CHF, chronic 08/19/2011    Study Conclusions  - Left ventricle: The cavity size was normal. Wall thickness was normal. Systolic function was normal. The estimated ejection fraction was in the range of 55% to 60%. Images were inadequate for LV wall motion assessment. Doppler parameters are consistent with abnormal left ventricular relaxation  (grade 1 diastolic dysfunction). - Aortic valve: Poorly visualized. - Mitral valve: Poorly visualized. - Left atrium: Poorly visualized. - Right ventricle: Poorly visualized. Probably normal size/systolic function. - Tricuspid valve: Poorly visualized. - Pulmonic valve: Poorly visualized. - Systemic veins: IVC not visualized. Impressions:  - Grossly normal LV systolic function. Otherwise, this echo is not technically adequate to accurately comment on other structures.       Past Surgical History  Procedure Date  . Lumbar fusion 01/2008    Prior to Admission medications   Medication Sig Start Date End Date Taking? Authorizing Provider  ALPRAZolam (XANAX) 0.25 MG tablet Take 0.25 mg by mouth daily as needed. Anxiety   Yes Historical Provider, MD  amLODipine (NORVASC) 5 MG tablet Take 1 tablet (5 mg total) by mouth daily. 08/03/11 08/02/12 Yes Romero Belling, MD  aspirin 81 MG tablet Take 81 mg by mouth daily.     Yes Historical Provider, MD  atorvastatin (LIPITOR) 80 MG tablet Take 1 tablet (80 mg total) by mouth daily. 08/03/11 08/02/12 Yes Romero Belling, MD  calcitRIOL (ROCALTROL) 0.25 MCG capsule TAKE 1 CAPSULE BY MOUTH EVERY DAY 08/03/11  Yes Romero Belling, MD  Cholecalciferol (VITAMIN D PO) Take 1 tablet by mouth daily.   Yes Historical Provider, MD  citalopram (CELEXA) 20 MG tablet Take 1 tablet (20 mg total) by mouth daily. 08/03/11 08/02/12 Yes Romero Belling, MD  diphenoxylate-atropine (LOMOTIL) 2.5-0.025 MG per tablet Take 1 tablet by mouth 4 (four) times daily as needed for diarrhea or loose stools. 08/16/11 08/26/11 Yes Cheri Guppy, MD  losartan-hydrochlorothiazide (HYZAAR) 100-25 MG per tablet Take 1 tablet by mouth daily. 08/03/11 08/02/12 Yes Romero Belling, MD  Multiple Vitamin (MULTIVITAMIN) tablet Take 1 tablet by mouth daily.     Yes Historical Provider, MD  omeprazole (PRILOSEC) 20 MG capsule TAKE 1 CAPSULE TWICE A DAY .. (TAKE 30 MINUTES BEFORE MEALS) 08/23/10  Yes Iva Boop, MD    oxyCODONE-acetaminophen (PERCOCET) 5-325 MG per tablet Take 1 tablet by mouth every 6 (six) hours as needed. pain 08/16/11 08/26/11 Yes Cheri Guppy, MD  tiotropium (SPIRIVA) 18 MCG inhalation capsule Place 1 capsule (18 mcg total) into inhaler and inhale daily. 05/24/11 05/23/12 Yes Romero Belling, MD    Current Facility-Administered Medications  Medication Dose Route Frequency Provider Last Rate Last Dose  . acetaminophen (TYLENOL) tablet 650 mg  650 mg Oral Q6H PRN Edsel Petrin, DO       Or  . acetaminophen (TYLENOL) suppository 650 mg  650 mg Rectal Q6H PRN Edsel Petrin, DO      . albuterol (PROVENTIL) (5 MG/ML) 0.5% nebulizer solution 2.5 mg  2.5 mg Nebulization Q4H PRN Zannie Cove, MD      . ALPRAZolam Prudy Feeler) tablet 0.25 mg  0.25 mg Oral QHS PRN Maryruth Bun Rama, MD      . alum & mag hydroxide-simeth (MAALOX/MYLANTA) 200-200-20 MG/5ML suspension 30 mL  30 mL Oral Q6H PRN Edsel Petrin, DO   30 mL at 08/18/11 1839  . aspirin EC tablet 81 mg  81 mg Oral Daily Edsel Petrin, DO  81 mg at 08/22/11 0948  . calcitRIOL (ROCALTROL) capsule 0.25 mcg  0.25 mcg Oral Daily Maryruth Bun Rama, MD   0.25 mcg at 08/22/11 0948  . citalopram (CELEXA) tablet 20 mg  20 mg Oral Daily Edsel Petrin, DO   20 mg at 08/22/11 0948  . feeding supplement (NEPRO CARB STEADY) liquid 237 mL  237 mL Oral Daily PRN Anastasio Champion, RD      . ondansetron (ZOFRAN) tablet 4 mg  4 mg Oral Q6H PRN Edsel Petrin, DO       Or  . ondansetron [facility]) injection 4 mg  4 mg Intravenous Q6H PRN Edsel Petrin, DO   4 mg at 08/21/11 1626  . piperacillin-tazobactam (ZOSYN) IVPB 3.375 g  3.375 g Intravenous [address] [place], [place]   3.375 g at 08/22/11 1610  . potassium chloride 10 mEq in 100 mL IVPB  10 mEq Intravenous Q1 Hr x 2 Maryruth Bun Rama, MD   10 mEq at 08/21/11 1318  . potassium chloride SA (K-DUR,KLOR-CON) CR tablet 40 mEq  40 mEq Oral TID Maryruth Bun Rama, MD   40  mEq at 08/22/11 0948  . sodium bicarbonate tablet 1,300 mg  1,300 mg Oral TID Maryruth Bun Rama, MD   1,300 mg at 08/22/11 0949  . sodium chloride 0.9 % injection 3 mL  3 mL Intravenous Q12H Edsel Petrin, DO   3 mL at 08/22/11 0949  . tiotropium (SPIRIVA) inhalation capsule 18 mcg  18 mcg Inhalation Daily Maryruth Bun Rama, MD   18 mcg at 08/22/11 0919  . traMADol-acetaminophen (ULTRACET) 37.5-325 MG per tablet 1 tablet  1 tablet Oral Q12H PRN Maryruth Bun Rama, MD        Allergies as of 08/17/2011  . (No Known Allergies)    Family History  Problem Relation Age of Onset  . Cancer Neg Hx     Colon Cancer  . Stroke Father   . Coronary artery disease Father   . Heart attack Father     History   Social History  . Marital Status: Married    Spouse Name: Erskine Squibb    Number of Children: Y  . Years of Education: N/A   Occupational History  . Art gallery manager     retired   Social History Main Topics  . Smoking status: Former Smoker -- 1.0 packs/day for 38 years    Types: Cigarettes    Quit date: 05/09/1982  . Smokeless tobacco: Not on file  . Alcohol Use: No  . Drug Use: No  . Sexually Active: Not on file   Other Topics Concern  . Not on file   Social History Narrative  . No narrative on file    Review of Systems: Pertinent positive and negative review of systems were noted in the above HPI section.  All other review of systems was otherwise negative.   Physical Exam: Vital signs in last 24 hours: Temp:  [97.9 F (36.6 C)-98.4 F (36.9 C)] 98.2 F (36.8 C) (04/15 0600) Pulse Rate:  [86-93] 93  (04/15 0600) Resp:  [20-22] 20  (04/15 0600) BP: (113-158)/(73-82) 113/78 mmHg (04/15 0600) SpO2:  [94 %-98 %] 94 % (04/15 0922) Weight:  [143 lb (64.864 kg)] 143 lb (64.864 kg) (04/15 0600) Last BM Date: 08/20/11 General:   Alert,  Well-developed ill-appearing elderly white male, pleasant and cooperative in NAD mild dyspnea at rest Head:  Normocephalic and atraumatic. Eyes:   Sclera clear, no icterus.  Conjunctiva pink. Ears:  Normal auditory acuity. Nose:  No deformity, discharge,  or lesions. Mouth:  No deformity or lesions.   Neck:  Supple; no masses or thyromegaly. Lungs: Decreased breath sounds throughout with scattered basilar rales and faint expiratory wheeze Heart:  Regular rate and rhythm; no murmurs, clicks, rubs,  or gallops. Abdomen: Distended and tympanitic bowel sounds high-pitched very minimally tender to deep palpation nonfocal he no palpable mass or hepatosplenomegaly Rectal:  Deferred  Msk:  Symmetrical without gross deformities. . Pulses:  Normal pulses noted. Extremities:  Without clubbing or edema. Neurologic:  Alert and  oriented x4;  grossly normal neurologically. Marland Kitchen Psych:  Alert and cooperative. Normal mood and affect.  Intake/Output from previous day: 04/14 0701 - 04/15 0700 In: 240 [P.O.:240] Out: -  Intake/Output this shift:    Lab Results:  Basename 08/22/11 0455 08/21/11 0545 08/20/11 0539  WBC 18.1* 12.3* 15.9*  HGB 11.6* 11.6* 10.9*  HCT 36.3* 36.1* 33.8*  PLT 142* 134* 113*   BMET  Basename 08/22/11 0455 08/21/11 0545 08/20/11 0539  NA 144 143 139  K 3.2* 3.4* 2.7*  CL 116* 114* 109  CO2 13* 15* 16*  GLUCOSE 123* 106* 104*  BUN 44* 40* 36*  CREATININE 1.93* 1.77* 1.56*  CALCIUM 9.7 9.7 9.4   LFT  Basename 08/22/11 0455  PROT 6.7  ALBUMIN 3.1*  AST 111*  ALT 148*  ALKPHOS 465*  BILITOT 0.7  BILIDIR --  IBILI --   PT/INR No results found for this basename: LABPROT:2,INR:2 in the last 72 hours Hepatitis Panel No results found for this basename: HEPBSAG,HCVAB,HEPAIGM,HEPBIGM in the last 72 hours      IMPRESSION:  #39 78 year old white male with acute illness x1 week initially presenting with severe diarrhea, hypotension and dyspnea. C. difficile PCR has been negative. Patient continues with diarrhea and clinically has an ileus Abnormal urinalysis on admission and bilateral perinephric stranding  on CT scan are concerning for pyelonephritis however not been unable to prove with negative cultures. It seems likely that this may be the underlying cause for his illness. #2 abnormal liver function studies, and mildly thickened gallbladder on ultrasound and CT with trace pericholecystic fluid. Clinically does not have any symptoms suggestive of cholecystitis. Certainly his abnormal liver function studies may be reactive to underlying infection. #3 dyspnea- history of COPD and congestive heart failure, significant elevation of BNP manage her management per medicine service #4 chronic renal insufficiency #5 hypokalemia correcting #6 Alzheimer's #7 coronary artery disease  PLAN: #1 consider HIDA scan #2 chest x-ray PA and lateral today, and abdominal films #3 if diarrhea persists may need to consider flexible sigmoidoscopy later in the week.     Amy Esterwood  08/22/2011, 11:02 AM  GI ATTENDING  HISTORY, LABS, X-RAYS REVIEWED. PATIENT SEEN AND EXAMINED. AGREE WITH H&P AS OUTLINED ABOVE. PATIENT'S WIFE AND SON IN ROOM. PATIENT PRESENTS WITH ACUTE DIARRHEA WITH DEHYDRATION AND POSSIBLE UROSEPSIS. CLINICALLY IMPROVING. ELEVATED LFT'S LIKELY REACTIVE FROM ACUTE ILLNESS. DO NOT SUSPECT BILIARY SEPSIS. AT THIS POINT WOULD CONTINUE WITH SUPPORTIVE CARE AND FOLLOW LFT'S. NO PLANS FOR FLEX SIG AT THIS POINT GIVEN NEGATIVE C DIFF PCR, NORMAL COLON ON CT, AND IMPROVING DIARRHEA. DISCUSSED WITH PATIENT AND FAMILY. MULTIPLE QUESTIONS ANSWERED. WILL FOLLOW. THANK YOU.  Wilhemina Bonito. Eda Keys., M.D. [facility] Division of Gastroenterology

## 2011-08-22 NOTE — Progress Notes (Signed)
ANTIBIOTIC CONSULT NOTE - INITIAL  Pharmacy Consult for Zosyn Indication: pyelonephritis/cholecystitis  No Known Allergies  Patient Measurements: Height: 5\' 10"  (177.8 cm) Weight: 143 lb (64.864 kg) IBW/kg (Calculated) : 73   Vital Signs: Temp: 98.2 F (36.8 C) (04/15 0600) Temp src: Oral (04/15 0600) BP: 113/78 mmHg (04/15 0600) Pulse Rate: 93  (04/15 0600) Intake/Output from previous day: 04/14 0701 - 04/15 0700 In: 240 [P.O.:240] Out: -  Intake/Output from this shift: Total I/O In: 840 [P.O.:840] Out: -   Labs:  Basename 08/22/11 0455 08/21/11 0545 08/20/11 0539  WBC 18.1* 12.3* 15.9*  HGB 11.6* 11.6* 10.9*  PLT 142* 134* 113*  LABCREA -- -- --  CREATININE 1.93* 1.77* 1.56*   Estimated Creatinine Clearance: 26.6 ml/min (by C-G formula based on Cr of 1.93).    Microbiology: Recent Results (from the past 720 hour(s))  CULTURE, BLOOD (ROUTINE X 2)     Status: Normal (Preliminary result)   Collection Time   08/18/11  1:19 AM      Component Value Range Status Comment   Specimen Description BLOOD R FOREARM   Final    Special Requests     Final    Value: BOTTLES DRAWN AEROBIC AND ANAEROBIC 4CC AEROBIC/3CC ANAEROBIC   Culture  Setup Time [id number]   Final    Culture     Final    Value:        BLOOD CULTURE RECEIVED NO GROWTH TO DATE CULTURE WILL BE HELD FOR 5 DAYS BEFORE ISSUING A FINAL NEGATIVE REPORT   Report Status PENDING   Incomplete   CULTURE, BLOOD (ROUTINE X 2)     Status: Normal (Preliminary result)   Collection Time   08/18/11  1:22 AM      Component Value Range Status Comment   Specimen Description BLOOD RIGHT ANTECUBITAL   Final    Special Requests BOTTLES DRAWN AEROBIC ONLY 5C   Final    Culture  Setup Time [id number]   Final    Culture     Final    Value:        BLOOD CULTURE RECEIVED NO GROWTH TO DATE CULTURE WILL BE HELD FOR 5 DAYS BEFORE ISSUING A FINAL NEGATIVE REPORT   Report Status PENDING   Incomplete   MRSA PCR SCREENING     Status:  Normal   Collection Time   08/18/11  2:27 AM      Component Value Range Status Comment   MRSA by PCR NEGATIVE  NEGATIVE  Final   CLOSTRIDIUM DIFFICILE BY PCR     Status: Normal   Collection Time   08/19/11  3:13 AM      Component Value Range Status Comment   C difficile by pcr NEGATIVE  NEGATIVE  Final   URINE CULTURE     Status: Normal   Collection Time   08/20/11  3:09 PM      Component Value Range Status Comment   Specimen Description URINE, CLEAN CATCH   Final    Special Requests NONE   Final    Culture  Setup Time [id number]   Final    Colony Count NO GROWTH   Final    Culture NO GROWTH   Final    Report Status 08/21/2011 FINAL   Final   STOOL CULTURE     Status: Normal (Preliminary result)   Collection Time   08/20/11 10:25 PM      Component Value Range Status Comment   Specimen Description STOOL  Final    Special Requests NONE   Final    Culture     Final    Value: Culture reincubated for better growth     Note: REDUCED NORMAL FLORA PRESENT   Report Status PENDING   Incomplete     Medical History: Past Medical History  Diagnosis Date  . COPD (chronic obstructive pulmonary disease)   . Osteoporosis   . GERD (gastroesophageal reflux disease)   . Hypertension   . Anemia   . Arthritis   . Depression   . Cerebrovascular disease, unspecified 02/2008    Carotid U/S- right internal carotid 60-79%, left internal carotid artery 40-59%  . Secondary hyperparathyroidism (of renal origin)   . Other and unspecified hyperlipidemia   . Peripheral vascular disease, unspecified   . Atrial fibrillation   . Unspecified disorder resulting from impaired renal function   . Hiatal hernia   . Hyperkalemia     Due to ACE Inhibitors  . Varicosities     LE  . CVA (cerebral infarction)   . Lumbago   . Lumbosacral spondylosis without myelopathy   . Chronic pain syndrome   . Diverticulosis 08/18/2011  . Enlarged prostate 08/18/2011  . CKD (chronic kidney disease) stage 3, GFR 30-59  ml/min 08/18/2011  . Left renal mass 08/18/2011  . Right bundle branch block 08/18/2011  . Diastolic CHF, chronic 08/19/2011    Study Conclusions  - Left ventricle: The cavity size was normal. Wall thickness was normal. Systolic function was normal. The estimated ejection fraction was in the range of 55% to 60%. Images were inadequate for LV wall motion assessment. Doppler parameters are consistent with abnormal left ventricular relaxation (grade 1 diastolic dysfunction). - Aortic valve: Poorly visualized. - Mitral valve: Poorly visualized. - Left atrium: Poorly visualized. - Right ventricle: Poorly visualized. Probably normal size/systolic function. - Tricuspid valve: Poorly visualized. - Pulmonic valve: Poorly visualized. - Systemic veins: IVC not visualized. Impressions:  - Grossly normal LV systolic function. Otherwise, this echo is not technically adequate to accurately comment on other structures.       Medications:  Scheduled:     . aspirin EC  81 mg Oral Daily  . calcitRIOL  0.25 mcg Oral Daily  . citalopram  20 mg Oral Daily  . piperacillin-tazobactam (ZOSYN)  IV  3.375 g Intravenous Q8H  . potassium chloride  40 mEq Oral TID  . sodium chloride  3 mL Intravenous Q12H  . tiotropium  18 mcg Inhalation Daily  . DISCONTD: sodium bicarbonate  1,300 mg Oral TID   Infusions:     . dextrose 5 % 1,000 mL with potassium chloride 40 mEq, sodium bicarbonate 150 mEq infusion 100 mL/hr at 08/22/11 1429  . DISCONTD:  sodium bicarbonate infusion 1000 mL      Assessment:  Day #3 Zosyn 3.375gm IV q8h for empiric coverage of pna, uti, gallbladder infection, sepsis of unknown etiology  Adding vancomycin today to cover for Staph since WBC continue to rise, now for r/o discitis  Scr worsening, CrCl ~30 ml/min  Afeb, all cultures negative to date   Goal of Therapy:  Vancomycin troughs 15-20 mcg/ml  Plan:   Begin vancomycin 1gm IV q24h  Check trough at steady state  Continue zosyn as  ordered  Follow renal function closely  Loralee Pacas, PharmD, BCPS Pager: [phone] 08/22/2011 3:19 PM

## 2011-08-22 NOTE — Progress Notes (Addendum)
PROGRESS NOTE  Brandon [name] ZOX:[id number] DOB: January 11, 1926 DOA: 08/17/2011 PCP: Brandon Belling, MD, MD  Brief narrative: Brandon [name] is an 78 year old man with a PMH of chronic renal failure, hypertension, Alzheimer's disease, COPD, atrial fibrillation, recent treatment with antibiotics for bronchitis who was brought to the ER on 08/18/11 with the acute onset of diarrhea and dyspnea.  Upon initial evaluation, he was found to be hypotensive with marked leukocytosis.  He has dramatically improved on empiric Rocephin and Flagyl.   Assessment/Plan: Principal Problem:  *Diarrhea / Sepsis /  Hypotension /  Leukocytosis /  Metabolic acidosis, normal anion gap (NAG) / Pyelonephritis vs. Cholecystitis vs. Occult infection  Index of suspicion initially high for C. difficile colitis given recent antibiotic use and high white blood cell count, but C. difficile PCR studies were negative. Stool cultures also ordered with results pending.  Metabolic acidosis likely from sepsis and bicarbonate losses in the stool. Lactic acid not elevated. Started on oral bicarbonate replacement therapy, but no appreciable improvement.  IV started 08/22/11.  Pro calcitonin and lactic acid not elevated on 08/21/11.  Initially treated empirically with Flagyl until C. difficile studies came back negative.  Was also on initially on empiric Rocephin for the possibility of other sources of infection including the lungs and the urinary tract. Unfortunately, urine cultures not checked at the time of admission. Urine cultures sent on 08/20/11, but were negative, as expected.  CT scan of abdomen showed possible gallbladder inflammation as well as perinephric stranding.  Abdominal ultrasound not consistent with cholecystitis so source of infection felt to be pyelonephritis.  White blood cell count steadily improved since admission but spiked up after antibiotics narrowed to Ceftin, making cholecystitis a higher possibility on the  differential.  The patient also developed increased chest congestion with narrowing of antibiotics.  Antibiotics broadened to Zosyn on 08/20/11 to cover pneumonia, pyelonephritis and cholecystitis.  WBC still elevated, despite change.  Blood cultures negative to date.  GI consult for help with management.  Vancomycin added 08/22/11 to cover Staph.  MRI lumbar spine rule out diskitis, given h/o back pain and lumbar laminectomy. Active Problems:  ANEMIA OF CHRONIC DISEASE  Normocytic anemia, likely of chronic disease.  Hemoglobin 9.2-11.5 mg/dL, stable.  On Aranesp as an outpatient.  MYOCARDIAL INFARCTION, HX OF / Elevated CK-MB  Troponins were negative x 3.  No complaints of chest pain at this time.  12-lead EKG nonacute.  Two-dimensional echocardiogram showed grossly normal LV function.  Continue aspirin therapy.  Atrial fibrillation / nonsustained ventricular tachycardia  Maintaining normal sinus rhythm on telemetry.  One episode of nonsustained ventricular tachycardia, likely exacerbated by hypokalemia. Repleting potassium.  PERIPHERAL VASCULAR DISEASE  Continue aspirin.  COPD / Dyspnea / Hypoxia  Oxygen saturations 96-100%.  Continue nebulized bronchodilator therapy.  Increased chest congestion noted 08/20/11 felt to be secondary to pulmonary edema, pro-BNP was elevated.  Responded to Lasix given 08/20/11.  SECONDARY HYPERPARATHYROIDISM  Continue calcitriol.  Alzheimer's disease  Appears stable.  AKI (acute kidney injury) /  CKD (chronic kidney disease) stage 3, GFR 30-59 ml/min  Baseline creatinine is 1.8.  Acute renal failure likely secondary to sepsis.  Creatinine back to baseline with gentle IVF.  IVF stopped due to concerns for pulmonary edema, resumed 08/1511.  Hyzaar on hold.  Abnormal LFTs  Abdominal exam is benign.  Likely secondary to sepsis versus passive congestion of the liver from congestive heart failure.  Transaminases and alkaline  phosphatase still markedly abnormal.  Abdominal ultrasound to  evaluate gallbladder/biliary tree performed on 08/19/2011. No significant evidence of cholecystitis.  Statin on hold.  GI consulted.  Elevated brain natriuretic peptide (BNP) level / chronic diastolic CHF  Chest x-ray clear with no evidence of pulmonary edema on admission.  Lungs clear to auscultation on exam on admission.  Suspect initial elevation of pro-BNP secondary to decreased renal clearance and chronic diastolic dysfunction.  Last two-dimensional echocardiogram done 07/28/2008, poor study, but LV function appeared to be normal at that time.  Repeat two-dimensional echocardiogram showed normal LV function, grade I diastolic dysfunction.  Lasix 40 mg IV x1 given on 08/20/2011 for increased chest congestion.    Hyponatremia  Likely secondary to dehydration from GI losses.  Resolved with gentle IV fluids.  Hypokalemia  Secondary to GI losses.  Repleting orally and intravenously.  Should improve with decreasing diarrhea.  Magnesium checked 08/20/11, and was normal.  Left renal mass  Consistent with a complex cyst on ultrasound.  Will need further evaluation to rule out underlying malignancy. Followup imaging recommended in 3 months.  Enlarged prostate  No complaints of urinary hesitancy.  Right bundle branch block  Chronic.   Code Status: Full Family Communication: Perfecto, [place] [phone]; wife updated at bedside 08/21/11. Disposition Plan: Home when stable, depending on progress  Consultants:  None  Antibiotics:  Rocephin 08/18/11--->Ceftin 08/19/11.  Flagyl 08/18/11--->08/19/11  Zosyn 08/18/11--->08/18/11, Restarted 08/20/11---->  Cipro 08/18/11--->08/18/11  Vancomycin 08/22/11    Subjective  Brandon [name] is more lethargic.  He is breathing heavy.  His appetite is poor and he continues to have episodes of diarrhea.   Objective    Interim History: Stable  overnight.   Objective: Filed Vitals:   08/21/11 1457 08/21/11 2038 08/22/11 0600 08/22/11 0922  BP: 157/82 158/73 113/78   Pulse: 86 90 93   Temp: 97.9 F (36.6 C) 98.4 F (36.9 C) 98.2 F (36.8 C)   TempSrc: Oral Oral Oral   Resp: 20 22 20    Height:      Weight:   64.864 kg (143 lb)   SpO2: 98% 96% 94% 94%    Intake/Output Summary (Last 24 hours) at 08/22/11 1321 Last data filed at 08/22/11 1238  Gross per 24 hour  Intake   1080 ml  Output      0 ml  Net   1080 ml    Exam: Gen:  NAD Cardiovascular:  RRR, No M/R/G Respiratory: Lungs diminished, clear. Gastrointestinal: Abdomen distended, NT with normal active bowel sounds. Extremities: No C/E/C   Data Reviewed: Basic Metabolic Panel:  Lab 08/22/11 5784 08/21/11 0545 08/20/11 0539 08/19/11 0335 08/18/11 0424  NA 144 143 139 136 133*  K 3.2* 3.4* -- -- --  CL 116* 114* 109 104 103  CO2 13* 15* 16* 17* 19  GLUCOSE 123* 106* 104* 134* 109*  BUN 44* 40* 36* 44* 43*  CREATININE 1.93* 1.77* 1.56* 1.85* 2.44*  CALCIUM 9.7 9.7 9.4 8.4 8.3*  MG -- -- 2.1 -- 2.0  PHOS -- -- -- -- --   GFR Estimated Creatinine Clearance: 26.6 ml/min (by C-G formula based on Cr of 1.93). Liver Function Tests:  Lab 08/22/11 0455 08/21/11 0545 08/20/11 0539 08/19/11 0335 08/18/11 0424  AST 111* 139* 68* 56* 85*  ALT 148* 148* 115* 122* 149*  ALKPHOS 465* 522* 529* 483* 413*  BILITOT 0.7 0.7 0.7 1.2 2.2*  PROT 6.7 6.5 6.1 5.5* 5.9*  ALBUMIN 3.1* 3.0* 2.7* 2.4* 2.6*    CBC:  Lab 08/22/11 0455 08/21/11 0545  08/20/11 0539 08/19/11 0335 08/18/11 0424  WBC 18.1* 12.3* 15.9* 8.3 16.7*  NEUTROABS -- -- -- -- --  HGB 11.6* 11.6* 10.9* 9.2* 9.9*  HCT 36.3* 36.1* 33.8* 28.7* 31.3*  MCV 93.6 91.9 91.8 91.1 94.6  PLT 142* 134* 113* 85* 92*   Cardiac Enzymes:  Lab 08/18/11 1925 08/18/11 1146 08/18/11 0424  CKTOTAL 256* 340* 323*  CKMB 14.0* 14.8* 10.9*  CKMBINDEX -- -- --  TROPONINI <0.30 <0.30 <0.30   BNP:  Ref. Range 08/17/2011  23:31 08/18/2011 04:24 08/21/2011 05:45  Pro B Natriuretic peptide (BNP) Latest Range: 0-450 pg/mL 3612.0 (H) 3665.0 (H) 2129.0 (H)   Sepsis Evaluation:  Ref. Range 08/21/2011 05:45  Lactic Acid, Venous Latest Range: 0.5-2.2 mmol/L 0.7  Procalcitonin No range found 0.48   Microbiology Recent Results (from the past 240 hour(s))  CULTURE, BLOOD (ROUTINE X 2)     Status: Normal (Preliminary result)   Collection Time   08/18/11  1:19 AM      Component Value Range Status Comment   Specimen Description BLOOD R FOREARM   Final    Special Requests     Final    Value: BOTTLES DRAWN AEROBIC AND ANAEROBIC 4CC AEROBIC/3CC ANAEROBIC   Culture  Setup Time [id number]   Final    Culture     Final    Value:        BLOOD CULTURE RECEIVED NO GROWTH TO DATE CULTURE WILL BE HELD FOR 5 DAYS BEFORE ISSUING A FINAL NEGATIVE REPORT   Report Status PENDING   Incomplete   CULTURE, BLOOD (ROUTINE X 2)     Status: Normal (Preliminary result)   Collection Time   08/18/11  1:22 AM      Component Value Range Status Comment   Specimen Description BLOOD RIGHT ANTECUBITAL   Final    Special Requests BOTTLES DRAWN AEROBIC ONLY 5C   Final    Culture  Setup Time [id number]   Final    Culture     Final    Value:        BLOOD CULTURE RECEIVED NO GROWTH TO DATE CULTURE WILL BE HELD FOR 5 DAYS BEFORE ISSUING A FINAL NEGATIVE REPORT   Report Status PENDING   Incomplete   MRSA PCR SCREENING     Status: Normal   Collection Time   08/18/11  2:27 AM      Component Value Range Status Comment   MRSA by PCR NEGATIVE  NEGATIVE  Final   CLOSTRIDIUM DIFFICILE BY PCR     Status: Normal   Collection Time   08/19/11  3:13 AM      Component Value Range Status Comment   C difficile by pcr NEGATIVE  NEGATIVE  Final   URINE CULTURE     Status: Normal   Collection Time   08/20/11  3:09 PM      Component Value Range Status Comment   Specimen Description URINE, CLEAN CATCH   Final    Special Requests NONE   Final    Culture  Setup Time  [id number]   Final    Colony Count NO GROWTH   Final    Culture NO GROWTH   Final    Report Status 08/21/2011 FINAL   Final   STOOL CULTURE     Status: Normal (Preliminary result)   Collection Time   08/20/11 10:25 PM      Component Value Range Status Comment   Specimen Description STOOL   Final  Special Requests NONE   Final    Culture     Final    Value: Culture reincubated for better growth     Note: REDUCED NORMAL FLORA PRESENT   Report Status PENDING   Incomplete     Procedures and Diagnostic Studies:  Ct Abdomen Pelvis Wo Contrast 08/18/2011 IMPRESSION:  1.  Mildly distended gallbladder, with suggestion of trace pericholecystic fluid and mild associated soft tissue inflammation. This is nonspecific and could reflect the patient's baseline, though mild cholecystitis could have this appearance. 2.  Diffuse distension of the colon, predominantly with air; this could reflect very mild ileus.  The small bowel remains normal in caliber.  No evidence of bowel obstruction. 3.  Diffuse calcification along the abdominal aorta and its branches; scattered calcification along the renal arteries bilaterally, with mild bilateral renal atrophy. 4.  Mildly hyperdense 1.9 cm posterior left renal mass noted; malignancy cannot be excluded.  This could be further assessed on ultrasound, when and as deemed clinically appropriate. 5.  Borderline enlarged prostate, impressing on the base of the bladder. 6.  Diffuse diverticulosis along the distal descending and sigmoid colon, without definite evidence of diverticulitis. 7.  Mild bibasilar atelectasis noted; peribronchial thickening seen.  Original Report Authenticated By: Tonia Ghent, M.D.    Dg Chest 2 View 08/18/2011  IMPRESSION: Findings of COPD; no definite superimposed airspace consolidation seen.  Original Report Authenticated By: Tonia Ghent, M.D.    12 Lead EKG 08/18/11: Sinus rhythm with marked sinus arrhythmia; Left axis deviation; Right bundle  branch block; Abnormal ECG   2 D Echocardiogram 08/18/11: Study Conclusions - Left ventricle: The cavity size was normal. Wall thickness was normal. Systolic function was normal. The estimated ejection fraction was in the range of 55% to 60%. Images were inadequate for LV wall motion assessment. Doppler parameters are consistent with abnormal left ventricular relaxation (grade 1 diastolic dysfunction);  Grossly normal LV systolic function. Otherwise, this echo is not technically adequate to accurately comment on other structures.   US Abdomen Complete 08/19/2011 IMPRESSION:  1.  No shadowing gallstones are noted within gallbladder.  Mild thickening of the gallbladder wall without sonographic Murphy's sign. 2.  Normal CBD. 3.  No hydronephrosis or diagnostic renal calculus .There is a hypoechoic lesion mid pole posterior aspect of the left kidney measures 1.9 x 1.7 cm.  This corresponds to hyperdense exophytic lesion noted on CT scan.  Probable represents a complex cyst. Follow-up examination in 3 months is recommended to assure stability.  Original Report Authenticated By: Natasha Mead, M.D.   Scheduled Meds:    . aspirin EC  81 mg Oral Daily  . calcitRIOL  0.25 mcg Oral Daily  . citalopram  20 mg Oral Daily  . piperacillin-tazobactam (ZOSYN)  IV  3.375 g Intravenous Q8H  . potassium chloride  10 mEq Intravenous Q1 Hr x 2  . potassium chloride  40 mEq Oral TID  . sodium bicarbonate  1,300 mg Oral TID  . sodium chloride  3 mL Intravenous Q12H  . tiotropium  18 mcg Inhalation Daily   Continuous Infusions:      LOS: 5 days   Hillery Aldo, MD Pager [phone]  08/22/2011, 1:21 PM   Patient education given:  Patient name: Brandon [name] Hospitalist: Dr. Trula Ore Korvin Valentine Date 08/22/11  Medical Issues and Plan: Principal Problem:  *Intra-abdominal infection causing diarrhea /  Low blood pressure /  High white blood cell count (cells that fight infection) / Acidic blood  CT scan  showed some evidence of inflammation around the kidneys and gallbladder inflammation but no gallstones.  Urine cultures were not sent before antibiotics were started, subsequently sent and were negative.  Source of infection not entirely clear: Gallbladder, kidneys, intestines?  GI consultation requested to help sort out source of infection.  Currently on Zosyn (IV antibiotic);  White blood cells a little higher today (which may be from dehydration rather than a "true" increase) so we will start back on IV fluids.  Also added Vancomycin today.  Blood still on the acid side, so will add a buffer to the IV fluids.  MRI of back ordered to rule out infection.  Active Problems: Low blood count ("anemia")  Blood cells are normal in size.  Hemoglobin 9.2-11.5 mg/dL, stable. (Normal = 13).  11.6 again  today. History of heart disease  Enzyme tests specific for heart damage are negative (tested x 3)  No complaints of chest pain.  12-lead EKG does not show any evidence of acute heart issues.  Two-dimensional echocardiogram showed that the pumping mechanism of the heart is strong, but that the heart is a little stiff.  Continue aspirin therapy. History of Irregular heart rhythm (Atrial fibrillation)  Maintaining a normal sinus rhythm on telemetry.  Poor circulation  Continue aspirin.  COPD / Shortness of breath / Low oxygen level  Oxygen saturations 94-98%. (normal)  Continue nebulized bronchodilator therapy.  SECONDARY HYPERPARATHYROIDISM  Continue calcitriol.  AKI (acute kidney injury) /  CKD (chronic kidney disease) stage 3, GFR 30-59 ml/min  Baseline creatinine is 1.8. (this is a test that measures kidney function).  Creatinine today is 1.93, slightly higher than baseline values.  The rise may be reflective of dehydration, so we are starting IV fluids.  Acute renal failure likely secondary to infection and diarrhea  Abnormal liver function tests  Abdominal exam is  benign.  Some enzymes coming down, but alkaline phosphatase is up.  This can be seen with gallbladder infections   Abdominal ultrasound  showed some gallbladder inflammation but no gallstones and clear bile ducts.  GI specialist consulted.  Low potassium  Secondary to diarrhea.  Improving with supplements by mouth. (3.2 today, normal is 3.5-4.9.)  Low potassium levels can cause irritability of the heart and heart palpitations   Mass on the left kidney   Radiologist thinks this is a complex cyst and recommends repeat imaging in 3 months   Enlarged prostate  No complaints of urinary hesitancy.

## 2011-08-22 NOTE — Evaluation (Signed)
Occupational Therapy Evaluation Patient Details Name: Brandon [name] MRN: [id number] DOB: [birth year] Today's Date: 08/22/2011  Problem List:  Patient Active Problem List  Diagnoses  . Unspecified disorder of thyroid  . DYSLIPIDEMIA  . HYPERKALEMIA  . ANEMIA, IRON DEFICIENCY  . ANEMIA OF CHRONIC DISEASE  . UNSPECIFIED ANEMIA  . DEPRESSION  . HYPERTENSION  . MYOCARDIAL INFARCTION, HX OF  . Atrial fibrillation  . CEREBROVASCULAR DISEASE  . PERIPHERAL VASCULAR DISEASE  . COPD  . GERD  . HIATAL HERNIA  . ILEUS  . SECONDARY HYPERPARATHYROIDISM  . RENAL INSUFFICIENCY  . Bladder neck obstruction  . PSORIASIS  . HIP PAIN, BILATERAL  . ARTHRITIS, SPINE  . OSTEOPOROSIS  . Edema  . LOSS OF APPETITE  . WEIGHT LOSS  . Diarrhea  . HYPERGLYCEMIA  . DOE (dyspnea on exertion)  . Alzheimer's disease  . Lumbar disc disease  . Lumbago  . Balance disorder  . Encounter for long-term (current) use of other medications  . Special screening for malignant neoplasm of prostate  . AKI (acute kidney injury)  . Abnormal LFTs  . Hypotension  . Leukocytosis  . Metabolic acidosis, normal anion gap (NAG)  . Elevated CK-MB level  . Elevated brain natriuretic peptide (BNP) level  . Hyponatremia  . Hypokalemia  . CKD (chronic kidney disease) stage 3, GFR 30-59 ml/min  . Normocytic anemia  . Left renal mass  . Enlarged prostate  . Diverticulosis  . Right bundle branch block  . Dyspnea  . Hypoxia  . Diastolic CHF, chronic  . NSVT (nonsustained ventricular tachycardia)  . Pyelonephritis    Past Medical History:  Past Medical History  Diagnosis Date  . COPD (chronic obstructive pulmonary disease)   . Osteoporosis   . GERD (gastroesophageal reflux disease)   . Hypertension   . Anemia   . Arthritis   . Depression   . Cerebrovascular disease, unspecified 02/2008    Carotid U/S- right internal carotid 60-79%, left internal carotid artery 40-59%  . Secondary hyperparathyroidism (of  renal origin)   . Other and unspecified hyperlipidemia   . Peripheral vascular disease, unspecified   . Atrial fibrillation   . Unspecified disorder resulting from impaired renal function   . Hiatal hernia   . Hyperkalemia     Due to ACE Inhibitors  . Varicosities     LE  . CVA (cerebral infarction)   . Lumbago   . Lumbosacral spondylosis without myelopathy   . Chronic pain syndrome   . Diverticulosis 08/18/2011  . Enlarged prostate 08/18/2011  . CKD (chronic kidney disease) stage 3, GFR 30-59 ml/min 08/18/2011  . Left renal mass 08/18/2011  . Right bundle branch block 08/18/2011  . Diastolic CHF, chronic 08/19/2011    Study Conclusions  - Left ventricle: The cavity size was normal. Wall thickness was normal. Systolic function was normal. The estimated ejection fraction was in the range of 55% to 60%. Images were inadequate for LV wall motion assessment. Doppler parameters are consistent with abnormal left ventricular relaxation (grade 1 diastolic dysfunction). - Aortic valve: Poorly visualized. - Mitral valve: Poorly visualized. - Left atrium: Poorly visualized. - Right ventricle: Poorly visualized. Probably normal size/systolic function. - Tricuspid valve: Poorly visualized. - Pulmonic valve: Poorly visualized. - Systemic veins: IVC not visualized. Impressions:  - Grossly normal LV systolic function. Otherwise, this echo is not technically adequate to accurately comment on other structures.      Past Surgical History:  Past Surgical History  Procedure Date  .  Lumbar fusion 01/2008    OT Assessment/Plan/Recommendation OT Assessment Clinical Impression Statement: Pt is an 78 yo male who presents with pyelonephritis and Alzheimemers dementia. Skilled OT recommended to maximize I w/BADLs to supervision level in prep for safe d/c home with 24/7 A and HHOT or to next venue of care. Pts son said the family is considering placement. OT Recommendation/Assessment: Patient will need skilled OT in the  acute care venue OT Problem List: Decreased activity tolerance;Decreased safety awareness;Decreased cognition;Impaired balance (sitting and/or standing);Decreased knowledge of use of DME or AE Barriers to Discharge: Inaccessible home environment;Decreased caregiver support OT Therapy Diagnosis : Generalized weakness OT Plan OT Frequency: Min 2X/week OT Treatment/Interventions: Self-care/ADL training;Therapeutic activities;DME and/or AE instruction;Patient/family education;Balance training OT Recommendation Follow Up Recommendations: Skilled nursing facility vs Supervision/Assistance - 24 hour & Home health OT Equipment Recommended: Rolling walker with 5" wheels Individuals Consulted Consulted and Agree with Results and Recommendations: Patient;Family member/caregiver Family Member Consulted: son OT Goals Acute Rehab OT Goals OT Goal Formulation: With patient/family Time For Goal Achievement: 2 weeks ADL Goals Pt Will Perform Grooming: with supervision;Standing at sink (to improve standing activity tolerance.) ADL Goal: Grooming - Progress: Goal set today Pt Will Perform Lower Body Bathing: with supervision;Sit to stand from chair;Sit to stand from bed;with adaptive equipment ADL Goal: Lower Body Bathing - Progress: Goal set today Pt Will Perform Lower Body Dressing: with supervision;Sit to stand from bed;Sit to stand from chair;with adaptive equipment ADL Goal: Lower Body Dressing - Progress: Goal set today Pt Will Transfer to Toilet: with supervision;Regular height toilet;Ambulation ADL Goal: Toilet Transfer - Progress: Goal set today Pt Will Perform Toileting - Clothing Manipulation: with supervision ADL Goal: Toileting - Clothing Manipulation - Progress: Goal set today Pt Will Perform Toileting - Hygiene: with supervision;Sit to stand from 3-in-1/toilet ADL Goal: Toileting - Hygiene - Progress: Goal set today  OT Evaluation Precautions/Restrictions  Precautions Precautions:  Fall Restrictions Weight Bearing Restrictions: No Prior Functioning Home Living Lives With: Spouse Available Help at Discharge: Family Type of Home: House Home Access: Stairs to enter Secretary/administrator of Steps: 5 Entrance Stairs-Rails: Right Home Layout: Multi-level;Bed/bath upstairs Alternate Level Stairs-Number of Steps: 13 Alternate Level Stairs-Rails: Right Bathroom Shower/Tub: Engineer, manufacturing systems: Standard Home Adaptive Equipment: Walker - rolling;Straight cane Prior Function Level of Independence: Independent with assistive device(s)  ADL ADL Eating/Feeding: Performed;Independent Where Assessed - Eating/Feeding: Chair Grooming: Simulated;Set up Where Assessed - Grooming: Sitting, bed;Unsupported Upper Body Bathing: Simulated;Set up Where Assessed - Upper Body Bathing: Sitting, bed;Unsupported Lower Body Bathing: Simulated;Moderate assistance Where Assessed - Lower Body Bathing: Sit to stand from bed Upper Body Dressing: Simulated;Set up Where Assessed - Upper Body Dressing: Sitting, bed;Unsupported Lower Body Dressing: Simulated;Moderate assistance Lower Body Dressing Details (indicate cue type and reason): Pt has difficulty donning and doffing socks and shoes due to premorbid back problems. Where Assessed - Lower Body Dressing: Sit to stand from bed Toilet Transfer: Simulated;Minimal assistance Toilet Transfer Details (indicate cue type and reason): VCs for safe manipulation of RW to turn and sit in chair. Toilet Transfer Method: Stand pivot Toileting - Clothing Manipulation: Simulated;Minimal assistance Where Assessed - Glass blower/designer Manipulation: Sit to stand from 3-in-1 or toilet Toileting - Hygiene: Simulated;Minimal assistance Where Assessed - Toileting Hygiene: Sit to stand from 3-in-1 or toilet Tub/Shower Transfer: Not assessed Tub/Shower Transfer Method: Not assessed Equipment Used: Rolling walker ADL Comments: Pt very weak and  deconditioned. Pt fatigues very quickly. Max time and effort needed for all functional tasks. Vision/Perception  Vision - History Baseline Vision: Wears glasses all the time Visual History: Cataracts Patient Visual Report: No change from baseline Cognition Cognition Arousal/Alertness: Awake/alert Overall Cognitive Status: History of cognitive impairments History of Cognitive Impairment: Appears at baseline functioning Orientation Level: Oriented to person;Oriented to place;Oriented to time Cognition - Other Comments: Pt had no difficulty following 2 step commands. Sensation/Coordination   Extremity Assessment RUE Assessment RUE Assessment: Within Functional Limits LUE Assessment LUE Assessment: Within Functional Limits Mobility  Bed Mobility Supine to Sit: 6: Modified independent (Device/Increase time);HOB elevated (Comment degrees);With rails Transfers Sit to Stand: 4: Min assist;With upper extremity assist;From bed Sit to Stand Details (indicate cue type and reason): VCs for hand placement- to push from the bed Stand to Sit: 4: Min assist;With upper extremity assist;With armrests;To chair/3-in-1 Stand to Sit Details: VCs for hand placement. Exercises   End of Session OT - End of Session Activity Tolerance: Patient limited by fatigue Patient left: in chair;with call bell in reach;with family/visitor present General Behavior During Session: [facility] for tasks performed Cognition: Impaired, at baseline   Wael Maestas A OTR/L [phone] 08/22/2011, 12:15 PM

## 2011-08-23 ENCOUNTER — Inpatient Hospital Stay (HOSPITAL_COMMUNITY): Payer: Medicare Other

## 2011-08-23 LAB — DIFFERENTIAL
Eosinophils Absolute: 1.7 10*3/uL — ABNORMAL HIGH (ref 0.0–0.7)
Eosinophils Relative: 10 % — ABNORMAL HIGH (ref 0–5)
Lymphocytes Relative: 14 % (ref 12–46)
Monocytes Absolute: 2.7 10*3/uL — ABNORMAL HIGH (ref 0.1–1.0)
Neutrophils Relative %: 60 % (ref 43–77)

## 2011-08-23 LAB — COMPREHENSIVE METABOLIC PANEL
AST: 48 U/L — ABNORMAL HIGH (ref 0–37)
CO2: 19 mEq/L (ref 19–32)
Calcium: 9.5 mg/dL (ref 8.4–10.5)
Chloride: 115 mEq/L — ABNORMAL HIGH (ref 96–112)
Creatinine, Ser: 1.71 mg/dL — ABNORMAL HIGH (ref 0.50–1.35)
GFR calc Af Amer: 41 mL/min — ABNORMAL LOW (ref >90)
GFR calc non Af Amer: 35 mL/min — ABNORMAL LOW (ref >90)
Glucose, Bld: 154 mg/dL — ABNORMAL HIGH (ref 70–99)
Total Bilirubin: 0.6 mg/dL (ref 0.3–1.2)

## 2011-08-23 LAB — CBC
MCH: 29 pg (ref 26.0–34.0)
MCHC: 30.6 g/dL (ref 30.0–36.0)
Platelets: 118 10*3/uL — ABNORMAL LOW (ref 150–400)
RBC: 3.79 MIL/uL — ABNORMAL LOW (ref 4.22–5.81)

## 2011-08-23 MED ORDER — TECHNETIUM TC 99M MEBROFENIN IV KIT
5.5000 | PACK | Freq: Once | INTRAVENOUS | Status: AC | PRN
  Administered 2011-08-23: 6 via INTRAVENOUS

## 2011-08-23 MED ORDER — FLORA-Q PO CAPS
1.0000 | ORAL_CAPSULE | Freq: Every day | ORAL | Status: DC
  Administered 2011-08-23 – 2011-09-06 (×15): 1 via ORAL
  Filled 2011-08-23 (×15): qty 1

## 2011-08-23 MED ORDER — POTASSIUM CHLORIDE CRYS ER 20 MEQ PO TBCR
40.0000 meq | EXTENDED_RELEASE_TABLET | Freq: Two times a day (BID) | ORAL | Status: DC
  Administered 2011-08-23 – 2011-08-25 (×4): 40 meq via ORAL
  Filled 2011-08-23 (×5): qty 2

## 2011-08-23 NOTE — Progress Notes (Signed)
[facility] Progress Note  SUBJECTIVE: Patient cannot remember if he has been having diarrhea. Per RN, approximately 5 loose stools yesterday, none so far today.   OBJECTIVE:  Vital signs in last 24 hours: Temp:  [98.1 F (36.7 C)-98.4 F (36.9 C)] 98.1 F (36.7 C) (04/16 0515) Pulse Rate:  [70-80] 70  (04/16 0515) Resp:  [18-20] 20  (04/16 0515) BP: (127-172)/(76-82) 167/76 mmHg (04/16 0515) SpO2:  [94 %-99 %] 99 % (04/16 0515) Last BM Date: 08/22/11 General:    white male in NAD Abdomen:  Soft, largely distended, nontender. Abnormal bowel sounds in RUQ, otherwise, bowel sounds hypoactive. Psych:  Cooperative. Normal mood and affect.  Lab Results:  Basename 08/23/11 0500 08/22/11 0455 08/21/11 0545  WBC 16.8* 18.1* 12.3*  HGB 11.0* 11.6* 11.6*  HCT 36.0* 36.3* 36.1*  PLT 118* 142* 134*   BMET  Basename 08/23/11 0500 08/22/11 0455 08/21/11 0545  NA 145 144 143  K 3.6 3.2* 3.4*  CL 115* 116* 114*  CO2 19 13* 15*  GLUCOSE 154* 123* 106*  BUN 35* 44* 40*  CREATININE 1.71* 1.93* 1.77*  CALCIUM 9.5 9.7 9.7   LFT  Basename 08/23/11 0500  PROT 6.4  ALBUMIN 2.8*  AST 48*  ALT 106*  ALKPHOS 363*  BILITOT 0.6  BILIDIR --  IBILI --    Studies/Results: Mr Lumbar Spine Wo Contrast  08/22/2011  *RADIOLOGY REPORT*  Clinical Data: 78 year old male with severe low back pain. Previous fusion several years ago.  Renal insufficiency precludes contrast.  MRI LUMBAR SPINE WITHOUT CONTRAST  Technique:  Multiplanar and multiecho pulse sequences of the lumbar spine were obtained without intravenous contrast.  Comparison: Lumbar radiographs 02/25/2011.  Preoperative lumbar MRI 06/03/2008. Recent CT abdomen pelvis 08/18/2011.  Findings: Comparison radiographs demonstrate left lateral approach and interbody L3-L4 fusion hardware.  Mild susceptibility on these images result.  Levoconvex scoliosis.  Stable vertebral height and alignment since 2012.  Minimal marrow edema along  the left lateral T11-T12 end plate appears to be degenerative. Scattered chronic fatty marrow changes and benign vertebral body hemangiomas.  No acute osseous abnormality identified.   Visualized lower thoracic spinal cord is normal with conus medularis at T12.  Right posterior paraspinal muscle atrophy.  Visualized abdominal viscera are stable from recent 08/18/2011 exam, including fluid filled right colon.  T11-T12: Minimal disc bulge and left facet hypertrophy.  Mild left T11 foraminal stenosis.  T12-L1:  Minimal disc bulge.  Mild facet hypertrophy.  Mild left T12 foraminal stenosis.  L1-L2:  Minimal right eccentric disc bulge.  No stenosis.  L2-L3:  Left eccentric circumferential disc osteophyte complex with chronic trace retrolisthesis of L2 on L3. Moderate facet and left ligament flavum hypertrophy also is chronic and not significantly changed.  Left lateral recess stenosis appears less pronounced, and this level may have been surgically treated in the interim.  No significant spinal stenosis.  No significant foraminal stenosis.  L3-L4:  Interval surgery.  Residual circumferential endplate spurring and facet hypertrophy.  Decreased right lateral recess stenosis.  Spinal stenosis has resolved.  Moderate right greater than left L3 foraminal stenosis is stable.  L4-L5:  Interval surgery.  Residual circumferential endplate spurring.  Partial decompression.  Residual moderate facet hypertrophy.  Spinal stenosis and lateral recess stenosis appear resolved.  Moderate bilateral L4 foraminal stenosis is stable.  L5-S1:  Chronic left facet hypertrophy.  Fluid in that facet joint as before.  New lobulated partially T2 hyperintense lesion in the left L5 neural foramen measuring 6  x 9 mm abuts the exiting left L5 nerve (series 6 image 30).  Stable negative disc at this level.  No spinal or lateral recess stenosis.  IMPRESSION: 1.  Postoperative changes at L2-L3 through L4-L5 with improved thecal sac and lateral recess  patency at these levels.  Moderate bilateral L3 and L4 foraminal stenosis is stable. 2.  Chronic L5-S1 facet degeneration greater on the right.  Suspect interval development of left foraminal synovial cyst which could be impinging the left L5 nerve. Correlate for pain and radiculitis referable to this level. 3.   No strong evidence of diskitis osteomyelitis.  No lumbar spinal stenosis.  Original Report Authenticated By: Harley Hallmark, M.D.   Dg Abd Acute W/chest  08/22/2011  *RADIOLOGY REPORT*  Clinical Data: Ileus  ACUTE ABDOMEN SERIES (ABDOMEN 2 VIEW & CHEST 1 VIEW)  Comparison: Chest radiograph 08/17/2011, abdominal radiographs 08/27/2008 Correlation:  CT abdomen and pelvis 08/18/2011  Findings: Normal heart size and pulmonary vascularity. Calcified tortuous aorta. Emphysematous and bronchitic changes. Lungs otherwise clear. Gaseous distention of the colon to the distal sigmoid colon. Transverse colon measures up to 9.8 cm diameter and cecum up to 11.9 cm.  No small bowel dilatation, colonic wall thickening, or definite free intraperitoneal air identified. Prior L3-L4 fixation. Diffuse osseous demineralization. Scattered atherosclerotic calcifications. Calcifications project over the kidneys bilaterally question vascular versus calculi.  IMPRESSION: Significant diffuse colonic distention up to 11.9 cm diameter at the cecum. Colon has increased in diameter since the recent CT.  Original Report Authenticated By: Lollie Marrow, M.D.     ASSESSMENT / PLAN:  48. 78 year old white male with acute diarrhea. C-Diff is negative. He is on Zosyn.  Abdominal series yesterday c/w with peudoobstruction. Cecum measures 11.9cm. Pseudo obstruction possibly secondary to the underlying infectious process.He also had some metabolic disturbances (hypokalemia) which could contribute.  Of note, patient had a similar scenario in 2010 following surgery.  Clinically he seems to be doing okay, tolerating diet, no abdominal pain.     2. Elevated LFTs, ? reactive. He has a mildly thickened gallbladder and trace pericholecystic fluid on imaging studies but no abdominal pain. Had HIDA today, results pending. LFTs are coming down. Await HIDA.    LOS: 6 days   Willette Cluster  08/23/2011, 9:17 AM   GI ATTENDING  PATIENT SEEN AND EXAMINED. LABS AND XRAYS REVIEWED.WIFE IN ROOM. NO C/O. EATING. AMBULATING WITH WALKING. ONE LOOSE BM TODAY. ALL LABS IMPROVED. HIDA SCAN NEGATIVE. ABDOMEN MILDLY DISTENDED BUT OTHERWISE BENIGN. CONTINUE WITH SUPPORTIVE CARE.  Wilhemina Bonito. Eda Keys., M.D. [facility] Division of Gastroenterology

## 2011-08-23 NOTE — Progress Notes (Signed)
Occupational Therapy Treatment Patient Details Name: Brandon [name] MRN: [id number] DOB: 28-Oct-1925 Today's Date: 08/23/2011  OT Assessment/Plan OT Assessment/Plan Comments on Treatment Session: Con't to recommend st snf. Family will need to provide24/7 A if pt is to return home. OT Plan: Discharge plan remains appropriate OT Frequency: Min 2X/week Follow Up Recommendations: Skilled nursing facility;Supervision/Assistance - 24 hour;Home health OT Equipment Recommended: None recommended by OT OT Goals ADL Goals ADL Goal: Toilet Transfer - Progress: Progressing toward goals ADL Goal: Toileting - Clothing Manipulation - Progress: Progressing toward goals ADL Goal: Toileting - Hygiene - Progress: Progressing toward goals  OT Treatment Precautions/Restrictions  Precautions Precautions: Fall   ADL ADL Toilet Transfer: Minimal assistance;Performed Statistician Details (indicate cue type and reason): Deferred ambulation to the bathroom. Pt very weak and fatigued from tests and previous PT tx. Toilet Transfer Method: Surveyor, minerals: Set designer - Clothing Manipulation: Performed;Minimal assistance Where Assessed - Glass blower/designer Manipulation: Sit to stand from 3-in-1 or toilet Toileting - Hygiene: Performed;Minimal assistance Toileting - Hygiene Details (indicate cue type and reason): for thoroughness with hygeine. Where Assessed - Toileting Hygiene: Sit to stand from 3-in-1 or toilet Mobility  Bed Mobility Supine to Sit: 5: Supervision;With rails;HOB flat Sit to Supine: 5: Supervision;With rail;HOB flat Transfers Transfers: Yes Sit to Stand: 4: Min assist;With upper extremity assist;From bed;From chair/3-in-1 Sit to Stand Details (indicate cue type and reason): Vcs for hand placement. Stand to Sit: 4: Min assist;With upper extremity assist;With armrests;To bed;To chair/3-in-1 Stand to Sit Details: VCs for hand placement. Exercises     End of Session OT - End of Session Activity Tolerance: Patient limited by fatigue Patient left: in bed;with call bell in reach;with family/visitor present General Behavior During Session: Lethargic Cognition: Impaired, at baseline  Brandon [name] A OTR/L [phone] 08/23/2011, 3:39 PM

## 2011-08-23 NOTE — Progress Notes (Signed)
PROGRESS NOTE  TIRRELL BUCHBERGER ION:[id number] DOB: 1925-09-12 DOA: 08/17/2011 PCP: Romero Belling, MD, MD  Brief narrative: Brandon [name] is an 78 year old man with a PMH of chronic renal failure, hypertension, Alzheimer's disease, COPD, atrial fibrillation, recent treatment with antibiotics for bronchitis who was brought to the ER on 08/18/11 with the acute onset of diarrhea and dyspnea.  Upon initial evaluation, he was found to be hypotensive with marked leukocytosis.  The source of his sepsis has not been entirely clear and he initially improved in the first 24 hours but deteriorated when his antibiotics were narrowed.  Assessment/Plan: Principal Problem:  *Diarrhea / Sepsis /  Hypotension /  Leukocytosis /  Metabolic acidosis, normal anion gap (NAG) / Pyelonephritis vs. Cholecystitis vs. Occult infection  Index of suspicion initially high for C. difficile colitis given recent antibiotic use and high white blood cell count, but C. difficile PCR studies were negative. Stool cultures also ordered with results pending.  Metabolic acidosis likely from sepsis and bicarbonate losses in the stool. Lactic acid not elevated. Started on oral bicarbonate replacement therapy, but no appreciable improvement.  IV started 08/22/11 with subsequent normalization of bicarbonate.  Pro calcitonin and lactic acid not elevated on 08/21/11.  Initially treated empirically with Flagyl until C. difficile studies came back negative.  Was also on initially on empiric Rocephin for the possibility of other sources of infection including the lungs and the urinary tract. Unfortunately, urine cultures not checked at the time of admission. Urine cultures sent on 08/20/11, but were negative, as expected.  CT scan of abdomen showed possible gallbladder inflammation as well as perinephric stranding.  Abdominal ultrasound not consistent with cholecystitis so source of infection felt to be pyelonephritis.  White blood cell count steadily  improved since admission but spiked up after antibiotics narrowed to Ceftin, making cholecystitis a higher possibility on the differential.  The patient also developed increased chest congestion with narrowing of antibiotics.  Antibiotics broadened to Zosyn on 08/20/11 to cover pneumonia, pyelonephritis and cholecystitis.  WBC still elevated, despite change.  Blood cultures negative to date.  GI consult for help with management: HIDA scan ordered with results pending  Vancomycin added 08/22/11 to cover Staph.  MRI lumbar spine done 2 rule out diskitis, given h/o back pain and lumbar laminectomy, which was negative.  Panorex ordered to rule out significant dental infection.  Probiotics added for/16/2013 per wife's request.  If no source of infection identified definitively, would send him home on a seven-day course of Cipro and Flagyl when stable. Active Problems:  ANEMIA OF CHRONIC DISEASE  Normocytic anemia, likely of chronic disease.  Hemoglobin 9.2-11.5 mg/dL, stable.  On Aranesp as an outpatient.  MYOCARDIAL INFARCTION, HX OF / Elevated CK-MB  Troponins were negative x 3.  No complaints of chest pain at this time.  12-lead EKG nonacute.  Two-dimensional echocardiogram showed grossly normal LV function.  Continue aspirin therapy.  Atrial fibrillation / nonsustained ventricular tachycardia  Maintaining normal sinus rhythm on telemetry.  One episode of nonsustained ventricular tachycardia, likely exacerbated by hypokalemia. Repleting potassium.  PERIPHERAL VASCULAR DISEASE  Continue aspirin.  COPD / Dyspnea / Hypoxia  Oxygen saturations 96-99%.  Continue nebulized bronchodilator therapy.  Increased chest congestion noted 08/20/11 felt to be secondary to pulmonary edema, pro-BNP was elevated.  Responded to Lasix given 08/20/11.  SECONDARY HYPERPARATHYROIDISM  Continue calcitriol.  Alzheimer's disease  Appears stable.  AKI (acute kidney injury) /  CKD (chronic  kidney disease) stage 3, GFR 30-59 ml/min  Baseline creatinine is 1.8.  Acute renal failure likely secondary to sepsis.  Creatinine back to baseline with gentle IVF.  IVF stopped due to concerns for pulmonary edema, resumed 08/1511.  Hyzaar on hold.  Abnormal LFTs  Abdominal exam is benign.  Likely secondary to sepsis versus passive congestion of the liver from congestive heart failure.  Transaminases and alkaline phosphatase still markedly abnormal.  Abdominal ultrasound to evaluate gallbladder/biliary tree performed on 08/19/2011. No significant evidence of cholecystitis.  Statin on hold.  GI consulted: HIDA scan ordered, results pending.  Acute abdominal series done on 08/22/2011 showed diffuse colonic distention.  Elevated brain natriuretic peptide (BNP) level / chronic diastolic CHF  Chest x-ray clear with no evidence of pulmonary edema on admission.  Lungs clear to auscultation on exam on admission.  Suspect initial elevation of pro-BNP secondary to decreased renal clearance and chronic diastolic dysfunction.  Last two-dimensional echocardiogram done 07/28/2008, poor study, but LV function appeared to be normal at that time.  Repeat two-dimensional echocardiogram showed normal LV function, grade I diastolic dysfunction.  Lasix 40 mg IV x1 given on 08/20/2011 for increased chest congestion.    Hyponatremia  Likely secondary to dehydration from GI losses.  Resolved with gentle IV fluids.  Hypokalemia  Secondary to GI losses.  Repleting orally and intravenously.  Should improve with decreasing diarrhea.  Magnesium checked 08/20/11, and was normal.  Left renal mass  Consistent with a complex cyst on ultrasound.  Will need further evaluation to rule out underlying malignancy. Followup imaging recommended in 3 months.  Enlarged prostate  No complaints of urinary hesitancy.  Right bundle branch block  Chronic.   Code Status: Full Family Communication:  [place], [place] [phone]; wife and son updated at bedside 08/23/11. Disposition Plan: Home when stable, depending on progress  Consultants:  Dr. Yancey Flemings, Gastroenterology  Antibiotics:  Rocephin 08/18/11--->Ceftin 08/19/11.  Flagyl 08/18/11--->08/19/11  Zosyn 08/18/11--->08/18/11, Restarted 08/20/11---->  Cipro 08/18/11--->08/18/11  Vancomycin 08/22/11--->    Subjective  Mr. Doshi is less lethargic. He continues to complain of diarrhea, but is less frequent. His abdomen continues to be distended but non-painful and he continues to have a poor appetite.   Objective    Interim History: Stable overnight.   Objective: Filed Vitals:   08/22/11 1531 08/22/11 2145 08/23/11 0515 08/23/11 1035  BP: 127/80 172/82 167/76   Pulse: 79 80 70   Temp: 98.3 F (36.8 C) 98.4 F (36.9 C) 98.1 F (36.7 C)   TempSrc: Oral Oral Oral   Resp: 18 20 20    Height:      Weight:      SpO2: 97% 97% 99% 96%    Intake/Output Summary (Last 24 hours) at 08/23/11 1055 Last data filed at 08/23/11 0600  Gross per 24 hour  Intake 2441.67 ml  Output      0 ml  Net 2441.67 ml    Exam: Gen:  NAD Cardiovascular:  RRR, No M/R/G Respiratory: Lungs diminished, clear. Gastrointestinal: Abdomen distended, NT with normal active bowel sounds. Extremities: No C/E/C   Data Reviewed: Basic Metabolic Panel:  Lab 08/23/11 4742 08/22/11 0455 08/21/11 0545 08/20/11 0539 08/19/11 0335 08/18/11 0424  NA 145 144 143 139 136 --  K 3.6 3.2* -- -- -- --  CL 115* 116* 114* 109 104 --  CO2 19 13* 15* 16* 17* --  GLUCOSE 154* 123* 106* 104* 134* --  BUN 35* 44* 40* 36* 44* --  CREATININE 1.71* 1.93* 1.77* 1.56* 1.85* --  CALCIUM 9.5  9.7 9.7 9.4 8.4 --  MG -- -- -- 2.1 -- 2.0  PHOS -- -- -- -- -- --   GFR Estimated Creatinine Clearance: 30 ml/min (by C-G formula based on Cr of 1.71). Liver Function Tests:  Lab 08/23/11 0500 08/22/11 0455 08/21/11 0545 08/20/11 0539 08/19/11 0335  AST 48*  111* 139* 68* 56*  ALT 106* 148* 148* 115* 122*  ALKPHOS 363* 465* 522* 529* 483*  BILITOT 0.6 0.7 0.7 0.7 1.2  PROT 6.4 6.7 6.5 6.1 5.5*  ALBUMIN 2.8* 3.1* 3.0* 2.7* 2.4*    CBC:  Lab 08/23/11 0500 08/22/11 0455 08/21/11 0545 08/20/11 0539 08/19/11 0335  WBC 16.8* 18.1* 12.3* 15.9* 8.3  NEUTROABS 10.0* -- -- -- --  HGB 11.0* 11.6* 11.6* 10.9* 9.2*  HCT 36.0* 36.3* 36.1* 33.8* 28.7*  MCV 95.0 93.6 91.9 91.8 91.1  PLT 118* 142* 134* 113* 85*   Cardiac Enzymes:  Lab 08/18/11 1925 08/18/11 1146 08/18/11 0424  CKTOTAL 256* 340* 323*  CKMB 14.0* 14.8* 10.9*  CKMBINDEX -- -- --  TROPONINI <0.30 <0.30 <0.30   BNP:  Ref. Range 08/17/2011 23:31 08/18/2011 04:24 08/21/2011 05:45  Pro B Natriuretic peptide (BNP) Latest Range: 0-450 pg/mL 3612.0 (H) 3665.0 (H) 2129.0 (H)   Sepsis Evaluation:  Ref. Range 08/21/2011 05:45  Lactic Acid, Venous Latest Range: 0.5-2.2 mmol/L 0.7  Procalcitonin No range found 0.48   Microbiology Recent Results (from the past 240 hour(s))  CULTURE, BLOOD (ROUTINE X 2)     Status: Normal (Preliminary result)   Collection Time   08/18/11  1:19 AM      Component Value Range Status Comment   Specimen Description BLOOD R FOREARM   Final    Special Requests     Final    Value: BOTTLES DRAWN AEROBIC AND ANAEROBIC 4CC AEROBIC/3CC ANAEROBIC   Culture  Setup Time [id number]   Final    Culture     Final    Value:        BLOOD CULTURE RECEIVED NO GROWTH TO DATE CULTURE WILL BE HELD FOR 5 DAYS BEFORE ISSUING A FINAL NEGATIVE REPORT   Report Status PENDING   Incomplete   CULTURE, BLOOD (ROUTINE X 2)     Status: Normal (Preliminary result)   Collection Time   08/18/11  1:22 AM      Component Value Range Status Comment   Specimen Description BLOOD RIGHT ANTECUBITAL   Final    Special Requests BOTTLES DRAWN AEROBIC ONLY 5C   Final    Culture  Setup Time [id number]   Final    Culture     Final    Value:        BLOOD CULTURE RECEIVED NO GROWTH TO DATE CULTURE WILL BE  HELD FOR 5 DAYS BEFORE ISSUING A FINAL NEGATIVE REPORT   Report Status PENDING   Incomplete   MRSA PCR SCREENING     Status: Normal   Collection Time   08/18/11  2:27 AM      Component Value Range Status Comment   MRSA by PCR NEGATIVE  NEGATIVE  Final   CLOSTRIDIUM DIFFICILE BY PCR     Status: Normal   Collection Time   08/19/11  3:13 AM      Component Value Range Status Comment   C difficile by pcr NEGATIVE  NEGATIVE  Final   URINE CULTURE     Status: Normal   Collection Time   08/20/11  3:09 PM      Component Value  Range Status Comment   Specimen Description URINE, CLEAN CATCH   Final    Special Requests NONE   Final    Culture  Setup Time [id number]   Final    Colony Count NO GROWTH   Final    Culture NO GROWTH   Final    Report Status 08/21/2011 FINAL   Final   STOOL CULTURE     Status: Normal (Preliminary result)   Collection Time   08/20/11 10:25 PM      Component Value Range Status Comment   Specimen Description STOOL   Final    Special Requests NONE   Final    Culture     Final    Value: ABUNDANT YEAST     Note: REDUCED NORMAL FLORA PRESENT   Report Status PENDING   Incomplete     Procedures and Diagnostic Studies:  Ct Abdomen Pelvis Wo Contrast 08/18/2011 IMPRESSION:  1.  Mildly distended gallbladder, with suggestion of trace pericholecystic fluid and mild associated soft tissue inflammation. This is nonspecific and could reflect the patient's baseline, though mild cholecystitis could have this appearance. 2.  Diffuse distension of the colon, predominantly with air; this could reflect very mild ileus.  The small bowel remains normal in caliber.  No evidence of bowel obstruction. 3.  Diffuse calcification along the abdominal aorta and its branches; scattered calcification along the renal arteries bilaterally, with mild bilateral renal atrophy. 4.  Mildly hyperdense 1.9 cm posterior left renal mass noted; malignancy cannot be excluded.  This could be further assessed on  ultrasound, when and as deemed clinically appropriate. 5.  Borderline enlarged prostate, impressing on the base of the bladder. 6.  Diffuse diverticulosis along the distal descending and sigmoid colon, without definite evidence of diverticulitis. 7.  Mild bibasilar atelectasis noted; peribronchial thickening seen.  Original Report Authenticated By: Tonia Ghent, M.D.    Dg Chest 2 View 08/18/2011  IMPRESSION: Findings of COPD; no definite superimposed airspace consolidation seen.  Original Report Authenticated By: Tonia Ghent, M.D.    12 Lead EKG 08/18/11: Sinus rhythm with marked sinus arrhythmia; Left axis deviation; Right bundle branch block; Abnormal ECG   2 D Echocardiogram 08/18/11: Study Conclusions - Left ventricle: The cavity size was normal. Wall thickness was normal. Systolic function was normal. The estimated ejection fraction was in the range of 55% to 60%. Images were inadequate for LV wall motion assessment. Doppler parameters are consistent with abnormal left ventricular relaxation (grade 1 diastolic dysfunction);  Grossly normal LV systolic function. Otherwise, this echo is not technically adequate to accurately comment on other structures.   US Abdomen Complete 08/19/2011 IMPRESSION:  1.  No shadowing gallstones are noted within gallbladder.  Mild thickening of the gallbladder wall without sonographic Murphy's sign. 2.  Normal CBD. 3.  No hydronephrosis or diagnostic renal calculus .There is a hypoechoic lesion mid pole posterior aspect of the left kidney measures 1.9 x 1.7 cm.  This corresponds to hyperdense exophytic lesion noted on CT scan.  Probable represents a complex cyst. Follow-up examination in 3 months is recommended to assure stability.  Original Report Authenticated By: Natasha Mead, M.D.    Mr Lumbar Spine Wo Contrast 08/22/2011  IMPRESSION: 1.  Postoperative changes at L2-L3 through L4-L5 with improved thecal sac and lateral recess patency at these levels.  Moderate  bilateral L3 and L4 foraminal stenosis is stable. 2.  Chronic L5-S1 facet degeneration greater on the right.  Suspect interval development of left foraminal synovial cyst which could  be impinging the left L5 nerve. Correlate for pain and radiculitis referable to this level. 3.   No strong evidence of diskitis osteomyelitis.  No lumbar spinal stenosis.  Original Report Authenticated By: Harley Hallmark, M.D.    Dg Abd Acute W/chest 08/22/2011  IMPRESSION: Significant diffuse colonic distention up to 11.9 cm diameter at the cecum. Colon has increased in diameter since the recent CT.  Original Report Authenticated By: Lollie Marrow, M.D.   Scheduled Meds:    . aspirin EC  81 mg Oral Daily  . calcitRIOL  0.25 mcg Oral Daily  . citalopram  20 mg Oral Daily  . piperacillin-tazobactam (ZOSYN)  IV  3.375 g Intravenous Q8H  . potassium chloride  40 mEq Oral TID  . sodium chloride  3 mL Intravenous Q12H  . tiotropium  18 mcg Inhalation Daily  . vancomycin  1,000 mg Intravenous Q24H  . DISCONTD: sodium bicarbonate  1,300 mg Oral TID   Continuous Infusions:    . dextrose 5 % 1,000 mL with potassium chloride 40 mEq, sodium bicarbonate 150 mEq infusion 100 mL/hr at 08/23/11 1023  . DISCONTD:  sodium bicarbonate infusion 1000 mL        LOS: 6 days   Hillery Aldo, MD Pager [phone]  08/23/2011, 10:55 AM   Patient education given:  Patient name: Brandon [name] Hospitalist: Dr. Trula Ore Xaiver Roskelley Date 08/23/11  Medical Issues and Plan: Principal Problem:  *Intra-abdominal infection causing diarrhea /  Low blood pressure /  High white blood cell count (cells that fight infection) / Acidic blood  CT scan showed some evidence of inflammation around the kidneys and gallbladder inflammation but no gallstones.  Urine cultures were not sent before antibiotics were started, subsequently sent and were negative.  Source of infection not entirely clear: Gallbladder, kidneys, intestines?  GI  consultation requested to help sort out source of infection.  HIDA scan results pending.  Currently on Zosyn and vancomycin (IV antibiotic);  White blood cells a little lower today.  Blood still on the acid side, so we added a buffer to the IV fluids which has decreased the acid in the blood.  MRI of back ordered to rule out infection, this was negative.  Start Probiotic  Check dental x-rays.  Active Problems: Low blood count ("anemia")  Blood cells are normal in size.  Hemoglobin remains stable. History of heart disease  Enzyme tests specific for heart damage are negative (tested x 3)  No complaints of chest pain.  12-lead EKG does not show any evidence of acute heart issues.  Two-dimensional echocardiogram showed that the pumping mechanism of the heart is strong, but that the heart is a little stiff.  Continue aspirin therapy.  COPD / Shortness of breath / Low oxygen level  Oxygen saturations 94-98%. (normal)  Continue nebulized bronchodilator therapy.  AKI (acute kidney injury) /  CKD (chronic kidney disease) stage 3, GFR 30-59 ml/min  Baseline creatinine is 1.8. (this is a test that measures kidney function).  Creatinine today is 1.93, slightly higher than baseline values.  The rise may be reflective of dehydration, so we are starting IV fluids.  Acute renal failure likely secondary to infection and diarrhea  Abnormal liver function tests  Abdominal exam is benign.  Some enzymes coming down, but alkaline phosphatase is up.  This can be seen with gallbladder infections   Abdominal ultrasound  showed some gallbladder inflammation but no gallstones and clear bile ducts.  GI specialist consulted.  Low potassium  Secondary to diarrhea.  Improving with supplements by mouth. (3.2 today, normal is 3.5-4.9.)  Low potassium levels can cause irritability of the heart and heart palpitations   Mass on the left kidney   Radiologist thinks this is a complex cyst and  recommends repeat imaging in 3 months

## 2011-08-23 NOTE — Progress Notes (Signed)
Physical Therapy Treatment Patient Details Name: Brandon [name] MRN: [id number] DOB: 16-Jul-1925 Today's Date: 08/23/2011  PT Assessment/Plan  PT - Assessment/Plan Comments on Treatment Session: Pt progressing slowly. Wife and son present-discussed d/c plan. Wife is unsure at the moment. Both pt and wife prefer d/c home. Pt will require 24 hour supervision and likely min A at discharge for mobility/safety. SNF may be safest/best option at this time if pt/wife agreeable.   PT Plan: Discharge plan remains appropriate Follow Up Recommendations: Home health PT with Supervision/Assistance - 24 hour vs Skilled nursing facility (depending on wife's ability to provide assist and pt's progress) Equipment Recommended:  wife stated there is RW available at home. (will need RW for ambulation) PT Goals  Acute Rehab PT Goals PT Goal: Supine/Side to Sit - Progress: Progressing toward goal PT Goal: Sit to Stand - Progress: Progressing toward goal PT Goal: Stand to Sit - Progress: Progressing toward goal PT Goal: Ambulate - Progress: Progressing toward goal  PT Treatment Precautions/Restrictions  Precautions Precautions: Fall Restrictions Weight Bearing Restrictions: No Mobility (including Balance) Bed Mobility Bed Mobility: Yes Supine to Sit: 6: Modified independent (Device/Increase time);With rails;HOB elevated (Comment degrees) Transfers Transfers: Yes Sit to Stand Details (indicate cue type and reason): Min-guard assist. VCs safety, technique, hand placement.  Stand to Sit Details: Min-guard assist. VCs safety, technique, hand placement Ambulation/Gait Ambulation/Gait: Yes Ambulation/Gait Assistance Details (indicate cue type and reason): Min-guard assist. VCs safety, distance from RW. Better tolerance but still fatigues somewhat easily. Dyspnea 2/4 with ambulation.  Ambulation Distance (Feet): 100 Feet Assistive device: Rolling walker Gait Pattern: Step-through pattern;Decreased stride length     Exercise    End of Session PT - End of Session Equipment Utilized During Treatment: Gait belt Activity Tolerance: Patient limited by fatigue Patient left: in chair;with call bell in reach;with family/visitor present General Behavior During Session: [facility] for tasks performed Cognition: Impaired, at baseline  [name] 08/23/2011, 11:35 AM [phone]

## 2011-08-24 DIAGNOSIS — K045 Chronic apical periodontitis: Secondary | ICD-10-CM | POA: Diagnosis present

## 2011-08-24 DIAGNOSIS — A419 Sepsis, unspecified organism: Secondary | ICD-10-CM | POA: Diagnosis present

## 2011-08-24 LAB — COMPREHENSIVE METABOLIC PANEL
ALT: 67 U/L — ABNORMAL HIGH (ref 0–53)
AST: 26 U/L (ref 0–37)
Albumin: 2.5 g/dL — ABNORMAL LOW (ref 3.5–5.2)
Calcium: 8.9 mg/dL (ref 8.4–10.5)
Potassium: 2.8 mEq/L — ABNORMAL LOW (ref 3.5–5.1)
Sodium: 143 mEq/L (ref 135–145)
Total Protein: 5.6 g/dL — ABNORMAL LOW (ref 6.0–8.3)

## 2011-08-24 LAB — CBC
HCT: 31.8 % — ABNORMAL LOW (ref 39.0–52.0)
Hemoglobin: 10.3 g/dL — ABNORMAL LOW (ref 13.0–17.0)
MCH: 30.1 pg (ref 26.0–34.0)
MCHC: 32.4 g/dL (ref 30.0–36.0)
MCV: 93 fL (ref 78.0–100.0)

## 2011-08-24 LAB — CULTURE, BLOOD (ROUTINE X 2): Culture: NO GROWTH

## 2011-08-24 LAB — STOOL CULTURE

## 2011-08-24 MED ORDER — AMLODIPINE BESYLATE 5 MG PO TABS
5.0000 mg | ORAL_TABLET | Freq: Every day | ORAL | Status: DC
  Administered 2011-08-24 – 2011-08-25 (×2): 5 mg via ORAL
  Filled 2011-08-24 (×2): qty 1

## 2011-08-24 NOTE — Progress Notes (Addendum)
08-24-11 Spoke with patient and wife at bedside to discuss dc plans. Discussed therapy recommendations for possible snf. Wife and patient agreeable to SNF placement-- prefers [facility]. Will make CSW aware. Will cont to follow. PCP: Dr Romero Belling. Wife also reports that patient has had [facility] in the past and they really liked them. Would like [facility] after dc from snf. Both patient and wife would prefer short stay at SNF.   [place], [place]  [phone]

## 2011-08-24 NOTE — Progress Notes (Signed)
[facility] Progress Note  SUBJECTIVE: no abdominal pain, no nausea. Had only one loose stool this am.  OBJECTIVE:  Vital signs in last 24 hours: Temp:  [98.1 F (36.7 C)-98.7 F (37.1 C)] 98.7 F (37.1 C) (04/17 1013) Pulse Rate:  [66-81] 66  (04/17 1013) Resp:  [18-20] 20  (04/17 1013) BP: (149-178)/(74-80) 152/80 mmHg (04/17 1013) SpO2:  [95 %-100 %] 100 % (04/17 1013) Last BM Date: 08/24/11 General:    White male in NAD Lungs: Respirations even and unlabored Abdomen:  Soft, nontender and moderately distended with some "metallic sounds" Normal bowel sounds. Extremities:  Without edema. Neurologic:  Alert and oriented,  grossly normal neurologically. Psych:  Cooperative. Normal mood and affect.   Lab Results:  Basename 08/24/11 0459 08/23/11 0500 08/22/11 0455  WBC 14.9* 16.8* 18.1*  HGB 10.3* 11.0* 11.6*  HCT 31.8* 36.0* 36.3*  PLT 111* 118* 142*   BMET  Basename 08/24/11 0459 08/23/11 0500 08/22/11 0455  NA 143 145 144  K 2.8* 3.6 3.2*  CL 113* 115* 116*  CO2 20 19 13*  GLUCOSE 116* 154* 123*  BUN 25* 35* 44*  CREATININE 1.38* 1.71* 1.93*  CALCIUM 8.9 9.5 9.7   LFT  Basename 08/24/11 0459  PROT 5.6*  ALBUMIN 2.5*  AST 26  ALT 67*  ALKPHOS 275*  BILITOT 0.5  BILIDIR --  IBILI --    Studies/Results: Dg Orthopantogram  08/23/2011  *RADIOLOGY REPORT*  Clinical Data: Dental infection  ORTHOPANTOGRAM/PANORAMIC  Comparison: None.  Findings: The patient is partly edentulous.  There is severe dental caries with enamel erosion involving multiple teeth.  Peri apical lucency is present involving the remaining mandibular teeth.  This could indicate peri apical abscess.  No displaced mandibular fracture is identified.  The apices of the maxillary teeth are not well evaluated.  IMPRESSION: Severe dental caries bilaterally.  Possible peri apical abscess or apicitis involving visualized maxillary teeth.  Original Report Authenticated By: Harrel Lemon,  M.D.   Mr Lumbar Spine Wo Contrast  08/22/2011  *RADIOLOGY REPORT*  Clinical Data: 78 year old male with severe low back pain. Previous fusion several years ago.  Renal insufficiency precludes contrast.  MRI LUMBAR SPINE WITHOUT CONTRAST  Technique:  Multiplanar and multiecho pulse sequences of the lumbar spine were obtained without intravenous contrast.  Comparison: Lumbar radiographs 02/25/2011.  Preoperative lumbar MRI 06/03/2008. Recent CT abdomen pelvis 08/18/2011.  Findings: Comparison radiographs demonstrate left lateral approach and interbody L3-L4 fusion hardware.  Mild susceptibility on these images result.  Levoconvex scoliosis.  Stable vertebral height and alignment since 2012.  Minimal marrow edema along the left lateral T11-T12 end plate appears to be degenerative. Scattered chronic fatty marrow changes and benign vertebral body hemangiomas.  No acute osseous abnormality identified.   Visualized lower thoracic spinal cord is normal with conus medularis at T12.  Right posterior paraspinal muscle atrophy.  Visualized abdominal viscera are stable from recent 08/18/2011 exam, including fluid filled right colon.  T11-T12: Minimal disc bulge and left facet hypertrophy.  Mild left T11 foraminal stenosis.  T12-L1:  Minimal disc bulge.  Mild facet hypertrophy.  Mild left T12 foraminal stenosis.  L1-L2:  Minimal right eccentric disc bulge.  No stenosis.  L2-L3:  Left eccentric circumferential disc osteophyte complex with chronic trace retrolisthesis of L2 on L3. Moderate facet and left ligament flavum hypertrophy also is chronic and not significantly changed.  Left lateral recess stenosis appears less pronounced, and this level may have been surgically treated in the interim.  No significant spinal stenosis.  No significant foraminal stenosis.  L3-L4:  Interval surgery.  Residual circumferential endplate spurring and facet hypertrophy.  Decreased right lateral recess stenosis.  Spinal stenosis has resolved.   Moderate right greater than left L3 foraminal stenosis is stable.  L4-L5:  Interval surgery.  Residual circumferential endplate spurring.  Partial decompression.  Residual moderate facet hypertrophy.  Spinal stenosis and lateral recess stenosis appear resolved.  Moderate bilateral L4 foraminal stenosis is stable.  L5-S1:  Chronic left facet hypertrophy.  Fluid in that facet joint as before.  New lobulated partially T2 hyperintense lesion in the left L5 neural foramen measuring 6 x 9 mm abuts the exiting left L5 nerve (series 6 image 30).  Stable negative disc at this level.  No spinal or lateral recess stenosis.  IMPRESSION: 1.  Postoperative changes at L2-L3 through L4-L5 with improved thecal sac and lateral recess patency at these levels.  Moderate bilateral L3 and L4 foraminal stenosis is stable. 2.  Chronic L5-S1 facet degeneration greater on the right.  Suspect interval development of left foraminal synovial cyst which could be impinging the left L5 nerve. Correlate for pain and radiculitis referable to this level. 3.   No strong evidence of diskitis osteomyelitis.  No lumbar spinal stenosis.  Original Report Authenticated By: Harley Hallmark, M.D.   Nm Hepatobiliary  08/23/2011  *RADIOLOGY REPORT*  Clinical Data:  Rule out cholecystitis  NUCLEAR MEDICINE HEPATOBILIARY IMAGING  Technique:  Sequential images of the abdomen were obtained out to 60 minutes following intravenous administration of radiopharmaceutical.  Radiopharmaceutical:  mCi Tc-67m Choletec  Comparison:  Ultrasound 08/19/2011, CT 08/18/2011  Findings: Normal clearance of activity from the blood pool and homogeneous uptake within the liver.  The gallbladder is elongated and begins to fill  at 45 minutes.  Imaging was continued to 90 minutes to confirm the gallbladder activity.  Counts are evident within the small bowel by 25 minutes.  IMPRESSION:  1.  Patent cystic duct and common bile duct. 2.  Findings  inconsistent acute cholecystitis.   Original Report Authenticated By: Genevive Bi, M.D.   Dg Abd Acute W/chest  08/22/2011  *RADIOLOGY REPORT*  Clinical Data: Ileus  ACUTE ABDOMEN SERIES (ABDOMEN 2 VIEW & CHEST 1 VIEW)  Comparison: Chest radiograph 08/17/2011, abdominal radiographs 08/27/2008 Correlation:  CT abdomen and pelvis 08/18/2011  Findings: Normal heart size and pulmonary vascularity. Calcified tortuous aorta. Emphysematous and bronchitic changes. Lungs otherwise clear. Gaseous distention of the colon to the distal sigmoid colon. Transverse colon measures up to 9.8 cm diameter and cecum up to 11.9 cm.  No small bowel dilatation, colonic wall thickening, or definite free intraperitoneal air identified. Prior L3-L4 fixation. Diffuse osseous demineralization. Scattered atherosclerotic calcifications. Calcifications project over the kidneys bilaterally question vascular versus calculi.  IMPRESSION: Significant diffuse colonic distention up to 11.9 cm diameter at the cecum. Colon has increased in diameter since the recent CT.  Original Report Authenticated By: Lollie Marrow, M.D.    ASSESSMENT / PLAN:  61. 78 year old white male with acute diarrhea. C-Diff is negative. He is on Zosyn. Diarrhea improving.  2. Peudoobstruction. Cecum measures 11.9cm yesterday. Pseudo obstruction possibly secondary to the underlying infectious process.He also had some metabolic disturbances (hypokalemia) which could contribute. Of note, patient had a similar scenario in 2010 following surgery. Clinically he seems to be doing okay, tolerating diet, no abdominal pain.  3. Elevated LFTs, ? reactive. HIDA normal. LFTs continue to improve.      LOS: 7 days  Willette Cluster  08/24/2011, 10:39 AM  GI ATTENDING  PATIENT SEEN AND EXAMINED. AGREE WITH ABOVE. ALL PARAMETERS IMPROVING. NO GI C/O/ MINIMAL BMS. BENIGN ABDOMEN. IMPROVING LABS. CONTINUE WITH SUPPORTIVE CARE. NO FURTHER GI WORKUP PLANNED. CALL FOR QUESTIONS OR PROBLEMS. WILL SIGN OFF. THANKS    Greig Altergott N. Eda Keys., M.D. [facility] Division of Gastroenterology

## 2011-08-24 NOTE — Progress Notes (Signed)
CSW completed FL2 and placed it on chart for MD signature.  CSW to continue to follow.  Providence Crosby, LCSWA Clinical Social Work [phone]

## 2011-08-24 NOTE — Progress Notes (Signed)
Met with Pt and his wife re: d/c plans.  Per Pt's wife, Pt had been functioning well prior to 1 week before admission.  Mrs. Somera stated that Pt became weak and dehydrated and the family felt that he needed to go to the ED.  Pt and wife are amenable to SNF and are interested in [facility], with [facility] and [facility] as backups.  Pt and wife gave CSW permission to fax information to the above-mentioned facilities.  CSW to continue to follow.  Providence Crosby, LCSWA Clinical Social Work [phone]

## 2011-08-24 NOTE — Progress Notes (Signed)
Patient ID: Brandon [name], male   DOB: July 07, 1925, 78 y.o.   MRN: [id number]  Assessment/Plan:   Principal Problem:   *Diarrhea / Sepsis / Hypotension / Leukocytosis / Metabolic acidosis, normal anion gap (NAG) / Pyelonephritis vs. Cholecystitis vs. Occult infection  Index of suspicion initially high for C. difficile colitis given recent antibiotic use and high white blood cell count, but C. difficile PCR studies were negative. Stool cultures also ordered with results pending.  Metabolic acidosis likely from sepsis and bicarbonate losses in the stool. Lactic acid not elevated. Started on oral bicarbonate replacement therapy, but no appreciable improvement. IV started 08/22/11 with subsequent normalization of bicarbonate.  Pro calcitonin and lactic acid not elevated on 08/21/11.  Initially treated empirically with Flagyl until C. difficile studies came back negative.  Was also on initially on empiric Rocephin for the possibility of other sources of infection including the lungs and the urinary tract. Unfortunately, urine cultures not checked at the time of admission. Urine cultures sent on 08/20/11, but were negative, as expected.  CT scan of abdomen showed possible gallbladder inflammation as well as perinephric stranding. Abdominal ultrasound not consistent with cholecystitis so source of infection felt to be pyelonephritis.  White blood cell count steadily improved since admission but spiked up after antibiotics narrowed to Ceftin, making cholecystitis a higher possibility on the differential.  The patient also developed increased chest congestion with narrowing of antibiotics.  Antibiotics broadened to Zosyn on 08/20/11 to cover pneumonia, pyelonephritis and cholecystitis. WBC still elevated, despite change.  Blood cultures negative to date.  GI consult for help with management: HIDA scan ordered - results with no significant findings; GI signed off 08/24/2011 Vancomycin added 08/22/11 to cover Staph.   MRI lumbar spine done 2 rule out diskitis, given h/o back pain and lumbar laminectomy, which was negative.  Panorex ordered to rule out significant dental infection - showed dental abscess and dental surgery consulted Probiotics added for/16/2013 per wife's request.   Active Problems:   Dental Abscess - appreciate dental surgery consult - continue current antibiotic regimen  ANEMIA OF CHRONIC DISEASE  Normocytic anemia, likely of chronic disease.  Hemoglobin 9.2-11.5 mg/dL, stable.  On Aranesp as an outpatient.  MYOCARDIAL INFARCTION, HX OF / Elevated CK-MB  Troponins were negative x 3.  No complaints of chest pain at this time.  12-lead EKG nonacute.  Two-dimensional echocardiogram showed grossly normal LV function.  Continue aspirin therapy.  Atrial fibrillation / nonsustained ventricular tachycardia  Maintaining normal sinus rhythm on telemetry.  One episode of nonsustained ventricular tachycardia, likely exacerbated by hypokalemia. Repleting potassium.  PERIPHERAL VASCULAR DISEASE  Continue aspirin.  COPD / Dyspnea / Hypoxia  Oxygen saturations maintained above 90%  Continue nebulized bronchodilator therapy.  Increased chest congestion noted 08/20/11 felt to be secondary to pulmonary edema, pro-BNP was elevated. Responded to Lasix given 08/20/11.  SECONDARY HYPERPARATHYROIDISM  Continue calcitriol.  Alzheimer's disease  Appears stable.  AKI (acute kidney injury) / CKD (chronic kidney disease) stage 3, GFR 30-59 ml/min  Baseline creatinine is 1.8.  Acute renal failure likely secondary to sepsis.  Creatinine back to baseline with gentle IVF. IVF stopped due to concerns for pulmonary edema, resumed 08/1511.  Hyzaar on hold.  Abnormal LFTs  Abdominal exam is benign.  Likely secondary to sepsis versus passive congestion of the liver from congestive heart failure.  Transaminases and alkaline phosphatase still markedly abnormal.  Abdominal ultrasound to evaluate  gallbladder/biliary tree performed on 08/19/2011. No significant evidence of cholecystitis.  Statin  on hold.  GI consulted: HIDA scan ordered - normal  Acute abdominal series done on 08/22/2011 showed diffuse colonic distention.  Elevated brain natriuretic peptide (BNP) level / chronic diastolic CHF  Chest x-ray clear with no evidence of pulmonary edema on admission.  Lungs clear to auscultation on exam on admission.  Suspect initial elevation of pro-BNP secondary to decreased renal clearance and chronic diastolic dysfunction.  Last two-dimensional echocardiogram done 07/28/2008, poor study, but LV function appeared to be normal at that time.  Repeat two-dimensional echocardiogram showed normal LV function, grade I diastolic dysfunction.  Lasix 40 mg IV x1 given on 08/20/2011 for increased chest congestion.   Hyponatremia  Likely secondary to dehydration from GI losses.  Resolved with gentle IV fluids.  Hypokalemia  Secondary to GI losses.  Repleting orally and intravenously.  Should improve with decreasing diarrhea.  Magnesium checked 08/20/11, and was normal.  Left renal mass  Consistent with a complex cyst on ultrasound.  Will need further evaluation to rule out underlying malignancy. Followup imaging recommended in 3 months.  Enlarged prostate  No complaints of urinary hesitancy.  Right bundle branch block  Chronic.  Code Status: Full   Family Communication: Brandon, [name] [phone]; wife and son updated at bedside 08/23/11.   Disposition Plan: Home when stable, depending on progress   Consultants:  Dr. Yancey Flemings, Gastroenterology Dental surgery - consulted 08/24/2011 for dental abscess Physical therapy  Antibiotics:  Rocephin 08/18/11--->Ceftin 08/19/11.  Flagyl 08/18/11--->08/19/11  Zosyn 08/18/11--->08/18/11, Restarted 08/20/11---->  Cipro 08/18/11--->08/18/11  Vancomycin 08/22/11--->  Subjective: No events overnight.   Objective:  Vital signs in  last 24 hours:  Filed Vitals:   08/24/11 0954 08/24/11 1013 08/24/11 1427 08/24/11 2200  BP:  152/80 157/57 172/66  Pulse:  66 76 75  Temp:  98.7 F (37.1 C) 98.7 F (37.1 C) 98.3 F (36.8 C)  TempSrc:  Oral Oral Oral  Resp:  20 20 18   Height:      Weight:      SpO2: 95% 100% 100% 98%    Intake/Output from previous day:  Intake/Output Summary (Last 24 hours) at 08/24/11 2223 Last data filed at 08/24/11 1558  Gross per 24 hour  Intake   2460 ml  Output      0 ml  Net   2460 ml    Physical Exam: General: Alert, awake, oriented x3, in no acute distress. HEENT: No bruits, no goiter. Moist mucous membranes, no scleral icterus, no conjunctival pallor. Heart: Regular rate and rhythm, S1/S2 +, no murmurs, rubs, gallops. Lungs: Clear to auscultation bilaterally. No wheezing, no rhonchi, no rales.  Abdomen: Soft, nontender, nondistended, positive bowel sounds. Extremities: No clubbing or cyanosis, no pitting edema,  positive pedal pulses. Neuro: Grossly nonfocal.  Lab Results:  Lab 08/24/11 0459 08/23/11 0500 08/22/11 0455 08/21/11 0545 08/20/11 0539  WBC 14.9* 16.8* 18.1* 12.3* 15.9*  HGB 10.3* 11.0* 11.6* 11.6* 10.9*  HCT 31.8* 36.0* 36.3* 36.1* 33.8*  PLT 111* 118* 142* 134* 113*  MCV 93.0 95.0 93.6 91.9 91.8    Lab 08/24/11 0459 08/23/11 0500 08/22/11 0455 08/21/11 0545 08/20/11 0539  NA 143 145 144 143 139  K 2.8* 3.6 3.2* 3.4* 2.7*  CL 113* 115* 116* 114* 109  CO2 20 19 13* 15* 16*  GLUCOSE 116* 154* 123* 106* 104*  BUN 25* 35* 44* 40* 36*  CREATININE 1.38* 1.71* 1.93* 1.77* 1.56*  CALCIUM 8.9 9.5 9.7 9.7 9.4    Lab 08/18/11 1925 08/18/11 1146 08/18/11  0424  CKMB 14.0* 14.8* 10.9*  TROPONINI <0.30 <0.30 <0.30   CULTURE, BLOOD (ROUTINE X 2)     Status: Normal   Collection Time   08/18/11  1:19 AM      Component Value Range Status Comment   Specimen Description BLOOD R FOREARM   Final    Special Requests     Final    Value: BOTTLES DRAWN AEROBIC AND  ANAEROBIC 4CC AEROBIC/3CC ANAEROBIC   Culture  Setup Time [id number]   Final    Culture NO GROWTH 5 DAYS   Final    Report Status 08/24/2011 FINAL   Final   CULTURE, BLOOD (ROUTINE X 2)     Status: Normal   Collection Time   08/18/11  1:22 AM      Component Value Range Status Comment   Specimen Description BLOOD RIGHT ANTECUBITAL   Final    Special Requests BOTTLES DRAWN AEROBIC ONLY 5C   Final    Culture  Setup Time [id number]   Final    Culture NO GROWTH 5 DAYS   Final    Report Status 08/24/2011 FINAL   Final   MRSA PCR SCREENING     Status: Normal   Collection Time   08/18/11  2:27 AM      Component Value Range Status Comment   MRSA by PCR NEGATIVE  NEGATIVE  Final   CLOSTRIDIUM DIFFICILE BY PCR     Status: Normal   Collection Time   08/19/11  3:13 AM      Component Value Range Status Comment   C difficile by pcr NEGATIVE  NEGATIVE  Final   URINE CULTURE     Status: Normal   Collection Time   08/20/11  3:09 PM      Component Value Range Status Comment   Specimen Description URINE, CLEAN CATCH   Final    Special Requests NONE   Final    Culture  Setup Time [id number]   Final    Colony Count NO GROWTH   Final    Culture NO GROWTH   Final    Report Status 08/21/2011 FINAL   Final   STOOL CULTURE     Status: Normal   Collection Time   08/20/11 10:25 PM      Component Value Range Status Comment   Specimen Description STOOL   Final    Special Requests NONE   Final    Culture     Final    Value: ABUNDANT YEAST     NO SALMONELLA, SHIGELLA, CAMPYLOBACTER, OR YERSINIA ISOLATED     Note: REDUCED NORMAL FLORA PRESENT   Report Status 08/24/2011 FINAL   Final     Studies/Results: Dg Orthopantogram 08/23/2011  IMPRESSION: Severe dental caries bilaterally.  Possible peri apical abscess or apicitis involving visualized maxillary teeth.    Nm Hepatobiliary 08/23/2011   IMPRESSION:  1.  Patent cystic duct and common bile duct. 2.  Findings  inconsistent acute cholecystitis.       Medications: Scheduled Meds:   . amLODipine  5 mg Oral Daily  . aspirin EC  81 mg Oral Daily  . calcitRIOL  0.25 mcg Oral Daily  . citalopram  20 mg Oral Daily  . Flora-Q  1 capsule Oral Daily  . piperacillin-tazobactam (ZOSYN)  IV  3.375 g Intravenous Q8H  . potassium chloride  40 mEq Oral BID  . sodium chloride  3 mL Intravenous Q12H  . tiotropium  18 mcg Inhalation Daily  .  vancomycin  1,000 mg Intravenous Q24H   Continuous Infusions:   . dextrose 5 % 1,000 mL with potassium chloride 40 mEq, sodium bicarbonate 150 mEq infusion 100 mL/hr at 08/24/11 1923   PRN Meds:.acetaminophen, acetaminophen, albuterol, ALPRAZolam, alum & mag hydroxide-simeth, feeding supplement (NEPRO CARB STEADY), ondansetron (ZOFRAN) IV, ondansetron, traMADol-acetaminophen   LOS: 7 days   Jeyren Danowski 08/24/2011, 10:23 PM  [facility] Pager: [phone]

## 2011-08-25 ENCOUNTER — Encounter (HOSPITAL_COMMUNITY): Payer: Self-pay | Admitting: Dentistry

## 2011-08-25 DIAGNOSIS — Z8619 Personal history of other infectious and parasitic diseases: Secondary | ICD-10-CM

## 2011-08-25 DIAGNOSIS — I2510 Atherosclerotic heart disease of native coronary artery without angina pectoris: Secondary | ICD-10-CM

## 2011-08-25 LAB — CLOSTRIDIUM DIFFICILE BY PCR: Toxigenic C. Difficile by PCR: NEGATIVE

## 2011-08-25 LAB — BASIC METABOLIC PANEL
CO2: 26 mEq/L (ref 19–32)
Chloride: 109 mEq/L (ref 96–112)
Glucose, Bld: 113 mg/dL — ABNORMAL HIGH (ref 70–99)
Potassium: 2.9 mEq/L — ABNORMAL LOW (ref 3.5–5.1)
Sodium: 144 mEq/L (ref 135–145)

## 2011-08-25 LAB — CBC
Hemoglobin: 9.8 g/dL — ABNORMAL LOW (ref 13.0–17.0)
Platelets: 101 10*3/uL — ABNORMAL LOW (ref 150–400)
RBC: 3.36 MIL/uL — ABNORMAL LOW (ref 4.22–5.81)
WBC: 12.4 10*3/uL — ABNORMAL HIGH (ref 4.0–10.5)

## 2011-08-25 LAB — VANCOMYCIN, TROUGH: Vancomycin Tr: 12 ug/mL (ref 10.0–20.0)

## 2011-08-25 MED ORDER — AMLODIPINE BESYLATE 5 MG PO TABS
5.0000 mg | ORAL_TABLET | Freq: Once | ORAL | Status: AC
  Administered 2011-08-25: 5 mg via ORAL
  Filled 2011-08-25 (×2): qty 1

## 2011-08-25 MED ORDER — AMOXICILLIN-POT CLAVULANATE 500-125 MG PO TABS
1.0000 | ORAL_TABLET | Freq: Three times a day (TID) | ORAL | Status: DC
  Administered 2011-08-25 – 2011-08-27 (×7): 500 mg via ORAL
  Filled 2011-08-25 (×10): qty 1

## 2011-08-25 MED ORDER — ENSURE COMPLETE PO LIQD
237.0000 mL | Freq: Three times a day (TID) | ORAL | Status: DC
  Administered 2011-08-25 – 2011-08-27 (×7): 237 mL via ORAL

## 2011-08-25 MED ORDER — POTASSIUM CHLORIDE 20 MEQ/15ML (10%) PO LIQD
40.0000 meq | Freq: Two times a day (BID) | ORAL | Status: DC
  Administered 2011-08-25: 40 meq via ORAL
  Filled 2011-08-25 (×5): qty 30

## 2011-08-25 MED ORDER — AMLODIPINE BESYLATE 10 MG PO TABS
10.0000 mg | ORAL_TABLET | Freq: Every day | ORAL | Status: DC
  Administered 2011-08-26 – 2011-08-28 (×3): 10 mg via ORAL
  Filled 2011-08-25 (×4): qty 1

## 2011-08-25 MED ORDER — POTASSIUM CHLORIDE CRYS ER 20 MEQ PO TBCR
40.0000 meq | EXTENDED_RELEASE_TABLET | Freq: Two times a day (BID) | ORAL | Status: DC
  Administered 2011-08-25 – 2011-08-28 (×7): 40 meq via ORAL
  Filled 2011-08-25 (×9): qty 2

## 2011-08-25 MED ORDER — ENOXAPARIN SODIUM 30 MG/0.3ML ~~LOC~~ SOLN
30.0000 mg | SUBCUTANEOUS | Status: DC
  Administered 2011-08-25 – 2011-08-27 (×3): 30 mg via SUBCUTANEOUS
  Filled 2011-08-25 (×4): qty 0.3

## 2011-08-25 NOTE — Progress Notes (Signed)
Nutrition Follow-up  Diet Order: Regular, intake 25-100% of meals   - Wife states pt ate better last night than any night in admission. Wife states pt is eating mostly starches and yogurt. Wife's only concern is that pt has been more lethargic and sleeping through meals. Noted pt was started on probiotics on 4/16. Only 1 episode of diarrhea recorded this morning. Pt not interested in nutritional supplements.   Meds: Scheduled Meds:   . amLODipine  10 mg Oral Daily  . amLODipine  5 mg Oral Once  . aspirin EC  81 mg Oral Daily  . calcitRIOL  0.25 mcg Oral Daily  . citalopram  20 mg Oral Daily  . feeding supplement  237 mL Oral TID WC  . Flora-Q  1 capsule Oral Daily  . piperacillin-tazobactam (ZOSYN)  IV  3.375 g Intravenous Q8H  . potassium chloride  40 mEq Oral BID  . sodium chloride  3 mL Intravenous Q12H  . tiotropium  18 mcg Inhalation Daily  . vancomycin  1,000 mg Intravenous Q24H  . DISCONTD: amLODipine  5 mg Oral Daily  . DISCONTD: potassium chloride  40 mEq Oral BID   Continuous Infusions:   . DISCONTD: dextrose 5 % 1,000 mL with potassium chloride 40 mEq, sodium bicarbonate 150 mEq infusion 100 mL/hr at 08/25/11 0704   PRN Meds:.acetaminophen, acetaminophen, albuterol, ALPRAZolam, alum & mag hydroxide-simeth, feeding supplement (NEPRO CARB STEADY), ondansetron (ZOFRAN) IV, ondansetron, traMADol-acetaminophen  Labs:  CMP     Component Value Date/Time   NA 144 08/25/2011 0515   K 2.9* 08/25/2011 0515   CL 109 08/25/2011 0515   CO2 26 08/25/2011 0515   GLUCOSE 113* 08/25/2011 0515   BUN 18 08/25/2011 0515   CREATININE 1.38* 08/25/2011 0515   CALCIUM 8.8 08/25/2011 0515   CALCIUM 9.2 07/18/2011 0947   PROT 5.6* 08/24/2011 0459   ALBUMIN 2.5* 08/24/2011 0459   AST 26 08/24/2011 0459   ALT 67* 08/24/2011 0459   ALKPHOS 275* 08/24/2011 0459   BILITOT 0.5 08/24/2011 0459   GFRNONAA 46* 08/25/2011 0515   GFRAA 53* 08/25/2011 0515     Intake/Output Summary (Last 24 hours) at 08/25/11  1416 Last data filed at 08/25/11 0804  Gross per 24 hour  Intake 1138.33 ml  Output      0 ml  Net 1138.33 ml   Last BM - 4/18  Weight Status:   4/11 149 lb 11.1 oz 4/15 143 lb - Likely fluid related   Re-estimated needs:  2031-2370 calories 75-95g protein   Nutrition Dx: Inadequate oral intake - slowly improving  Goal: PO intake >75% of meals - not met consistently  Intervention: Encouraged increased intake when pt alert. Will monitor.   Monitor:  Weights, labs, intake, diarrhea   Marshall Cork Pager #: [phone]

## 2011-08-25 NOTE — Progress Notes (Signed)
Notified Pt and wife of [facility] offer.    Pt to have a dental procedure at [facility] on Monday.  Per Pt's wife, not likely Pt will d/c until then.  Notified Janie at [employer].  Janie requesting CSW contact her when Pt is ready for d/c.  CSW to continue to follow.  Providence Crosby, LCSWA Clinical Social Work [phone]

## 2011-08-25 NOTE — Progress Notes (Signed)
Subjective: No specific concerns by patient.  Wife had concerns about surgery and elevated blood pressure.  Objective: Vital signs in last 24 hours: Filed Vitals:   08/25/11 0215 08/25/11 0600 08/25/11 0936 08/25/11 1145  BP: 175/66 153/65 168/68   Pulse:  80    Temp:  98.8 F (37.1 C)  99.8 F (37.7 C)  TempSrc:  Oral  Oral  Resp:  18    Height:      Weight:      SpO2:  96%     Weight change:   Intake/Output Summary (Last 24 hours) at 08/25/11 1409 Last data filed at 08/25/11 0804  Gross per 24 hour  Intake 1138.33 ml  Output      0 ml  Net 1138.33 ml    Physical Exam: General: Awake, Oriented, No acute distress. HEENT: EOMI, nasal cannula in place. Neck: Supple CV: S1 and S2 Lungs: Clear to ascultation bilaterally Abdomen: Soft, Nontender, Nondistended, +bowel sounds. Ext: Good pulses. Trace edema.  Lab Results:  [facility] 08/25/11 0515 08/24/11 0459  NA 144 143  K 2.9* 2.8*  CL 109 113*  CO2 26 20  GLUCOSE 113* 116*  BUN 18 25*  CREATININE 1.38* 1.38*  CALCIUM 8.8 8.9  MG -- --  PHOS -- --    Basename 08/24/11 0459 08/23/11 0500  AST 26 48*  ALT 67* 106*  ALKPHOS 275* 363*  BILITOT 0.5 0.6  PROT 5.6* 6.4  ALBUMIN 2.5* 2.8*   No results found for this basename: LIPASE:2,AMYLASE:2 in the last 72 hours  Basename 08/25/11 0515 08/24/11 0459 08/23/11 0500  WBC 12.4* 14.9* --  NEUTROABS -- -- 10.0*  HGB 9.8* 10.3* --  HCT 31.4* 31.8* --  MCV 93.5 93.0 --  PLT 101* 111* --   No results found for this basename: CKTOTAL:3,CKMB:3,CKMBINDEX:3,TROPONINI:3 in the last 72 hours No components found with this basename: POCBNP:3 No results found for this basename: DDIMER:2 in the last 72 hours No results found for this basename: HGBA1C:2 in the last 72 hours No results found for this basename: CHOL:2,HDL:2,LDLCALC:2,TRIG:2,CHOLHDL:2,LDLDIRECT:2 in the last 72 hours No results found for this basename: TSH,T4TOTAL,FREET3,T3FREE,THYROIDAB in the last 72  hours No results found for this basename: VITAMINB12:2,FOLATE:2,FERRITIN:2,TIBC:2,IRON:2,RETICCTPCT:2 in the last 72 hours  Micro Results: Recent Results (from the past 240 hour(s))  CULTURE, BLOOD (ROUTINE X 2)     Status: Normal   Collection Time   08/18/11  1:19 AM      Component Value Range Status Comment   Specimen Description BLOOD R FOREARM   Final    Special Requests     Final    Value: BOTTLES DRAWN AEROBIC AND ANAEROBIC 4CC AEROBIC/3CC ANAEROBIC   Culture  Setup Time [id number]   Final    Culture NO GROWTH 5 DAYS   Final    Report Status 08/24/2011 FINAL   Final   CULTURE, BLOOD (ROUTINE X 2)     Status: Normal   Collection Time   08/18/11  1:22 AM      Component Value Range Status Comment   Specimen Description BLOOD RIGHT ANTECUBITAL   Final    Special Requests BOTTLES DRAWN AEROBIC ONLY 5C   Final    Culture  Setup Time [id number]   Final    Culture NO GROWTH 5 DAYS   Final    Report Status 08/24/2011 FINAL   Final   MRSA PCR SCREENING     Status: Normal   Collection Time   08/18/11  2:27 AM  Component Value Range Status Comment   MRSA by PCR NEGATIVE  NEGATIVE  Final   CLOSTRIDIUM DIFFICILE BY PCR     Status: Normal   Collection Time   08/19/11  3:13 AM      Component Value Range Status Comment   C difficile by pcr NEGATIVE  NEGATIVE  Final   URINE CULTURE     Status: Normal   Collection Time   08/20/11  3:09 PM      Component Value Range Status Comment   Specimen Description URINE, CLEAN CATCH   Final    Special Requests NONE   Final    Culture  Setup Time [id number]   Final    Colony Count NO GROWTH   Final    Culture NO GROWTH   Final    Report Status 08/21/2011 FINAL   Final   STOOL CULTURE     Status: Normal   Collection Time   08/20/11 10:25 PM      Component Value Range Status Comment   Specimen Description STOOL   Final    Special Requests NONE   Final    Culture     Final    Value: ABUNDANT YEAST     NO SALMONELLA, SHIGELLA,  CAMPYLOBACTER, OR YERSINIA ISOLATED     Note: REDUCED NORMAL FLORA PRESENT   Report Status 08/24/2011 FINAL   Final   CLOSTRIDIUM DIFFICILE BY PCR     Status: Normal   Collection Time   08/24/11  8:32 PM      Component Value Range Status Comment   C difficile by pcr NEGATIVE  NEGATIVE  Final     Studies/Results: No results found.  Medications: I have reviewed the patient's current medications. Scheduled Meds:   . amLODipine  10 mg Oral Daily  . amLODipine  5 mg Oral Once  . aspirin EC  81 mg Oral Daily  . calcitRIOL  0.25 mcg Oral Daily  . citalopram  20 mg Oral Daily  . feeding supplement  237 mL Oral TID WC  . Flora-Q  1 capsule Oral Daily  . piperacillin-tazobactam (ZOSYN)  IV  3.375 g Intravenous Q8H  . potassium chloride  40 mEq Oral BID  . sodium chloride  3 mL Intravenous Q12H  . tiotropium  18 mcg Inhalation Daily  . vancomycin  1,000 mg Intravenous Q24H  . DISCONTD: amLODipine  5 mg Oral Daily  . DISCONTD: potassium chloride  40 mEq Oral BID   Continuous Infusions:   . DISCONTD: dextrose 5 % 1,000 mL with potassium chloride 40 mEq, sodium bicarbonate 150 mEq infusion 100 mL/hr at 08/25/11 0704   PRN Meds:.acetaminophen, acetaminophen, albuterol, ALPRAZolam, alum & mag hydroxide-simeth, feeding supplement (NEPRO CARB STEADY), ondansetron (ZOFRAN) IV, ondansetron, traMADol-acetaminophen  Assessment/Plan: Consultants:  Dr. Yancey Flemings, Gastroenterology, signed off. Dental surgery - Dr. Kristin Bruins Physical therapy Antibiotics:  Rocephin 08/18/11--->Ceftin 08/19/11.  Flagyl 08/18/11--->08/19/11  Zosyn 08/18/11--->08/18/11, Restarted 08/20/11----> 08/25/2011 Cipro 08/18/11--->08/18/11  Vancomycin 08/22/11--->08/25/2011 Augmentin 08/25/2011--->  Diarrhea / Sepsis / Hypotension / Leukocytosis / Metabolic acidosis, normal anion gap (NAG) / Pyelonephritis vs. Cholecystitis vs. Occult infection  Index of suspicion initially high for C. difficile colitis given recent antibiotic use and  high white blood cell count, but C. difficile PCR studies were negative. Stool cultures on 08/20/2011 was negative.  Metabolic acidosis likely from sepsis and bicarbonate losses in the stool which resolved with bicarbonate replacement therapy.  Pro calcitonin and lactic acid not elevated on 08/21/11.  Initially treated empirically with  Flagyl until C. difficile studies came back negative and Flagyl was discontinued.  Was also on initially on empiric Rocephin for the possibility of other sources of infection including the lungs and the urinary tract. Unfortunately, urine cultures not checked at the time of admission. Urine cultures sent on 08/20/11, but were negative, as expected.  CT scan of abdomen showed possible gallbladder inflammation as well as perinephric stranding. Abdominal ultrasound not consistent with cholecystitis so source of infection felt to be pyelonephritis.  Antibiotics broadened to Zosyn on 08/20/11 to cover pneumonia, pyelonephritis and cholecystitis. WBC still elevated, despite change.  Blood cultures negative to date.  GI consult for help with management: HIDA scan ordered - results with no significant findings; GI signed off 08/24/2011  Vancomycin added 08/22/11 to cover Staph.  MRI lumbar spine done on 08/22/2011 to rule out diskitis, given h/o back pain and lumbar laminectomy, which was negative.  Orthopantogram performed on 08/23/2011 showed dental abscess and dental surgery consulted. Probiotics added for/16/2013 per wife's request.  Suspect patient's sepsis may have been from dental abscess.  Suspect patient's diarrhea may have been from antibiotic use.  Dental Abscess  Appreciate input from Dr. Kristin Bruins, Orthopantogram performed on 08/23/2011 showed dental abscess. Patient has been scheduled for scheduled for surgery on 08/29/2011 for extraction of remaining teeth with alveoloplasty and pre-prosthetic surgery. Patient will need followup with a dentist of his choice for  fabrication of upper and lower complete dentures as outpatient. Transition the patient from Zosyn and vancomycin to Augmentin.  Antibiotics since 08/18/2011.  Defined a total of 10 day course.  ANEMIA OF CHRONIC DISEASE  Normocytic anemia, likely of chronic disease and due to chronic kidney disease.  Hemoglobin stable.  On Aranesp as an outpatient.  History of coronary artery disease/myocardial infarction/Elevated CK-MB  Troponins were negative x 3.  No complaints of chest pain at this time.   2-D echocardiogram on 08/18/2011 showed ejection fraction of 55-60%. Continue aspirin.  History of Atrial fibrillation / nonsustained ventricular tachycardia  Maintaining normal sinus rhythm on telemetry.  One episode of nonsustained ventricular tachycardia, likely exacerbated by hypokalemia. Repleting potassium. Patient not on anticoagulation.  PERIPHERAL VASCULAR DISEASE  Continue aspirin.  COPD / Dyspnea / Hypoxia  Oxygen saturations maintained above 90%, continue nebulized bronchodilator therapy as needed.  Continue Spiriva. Increased chest congestion noted 08/20/11 felt to be secondary to pulmonary edema, pro-BNP was elevated. Responded to Lasix given 08/20/11.  SECONDARY HYPERPARATHYROIDISM  Continue calcitriol.  Alzheimer's disease  Appears stable.  AKI (acute kidney injury) / CKD (chronic kidney disease) stage 3, GFR 30-59 ml/min  Acute renal failure likely secondary to sepsis. Creatinine back to baseline with gentle IVF. IVF stopped due to concerns for pulmonary edema, resumed 08/22/11.  Patient's saline lock on 08/25/2011.  Continue to hold hyzaar.  Abnormal LFTs  Likely secondary to sepsis, improving.  Transaminases and alkaline phosphatase still markedly abnormal.  Abdominal ultrasound to evaluate gallbladder/biliary tree performed on 08/19/2011. No significant evidence of cholecystitis.  Statin on hold consider resuming statin at discharge.  Elevated brain natriuretic peptide  (BNP) level / chronic diastolic CHF  Chest x-ray clear with no evidence of pulmonary edema on admission. Suspect initial elevation of pro-BNP secondary to decreased renal clearance and chronic diastolic dysfunction.  Repeat two-dimensional echocardiogram showed normal LV function, grade I diastolic dysfunction.  Lasix 40 mg IV x1 given on 08/20/2011 for increased chest congestion.   Hyponatremia  Likely secondary to dehydration from GI losses, resolved with gentle IV fluids.  Hypokalemia  Secondary to GI losses, repleting orally.  Left renal mass  Consistent with a complex cyst on ultrasound on 08/19/1998 team.  Will need further evaluation as outpatient, with followup imaging in 3 months.  Enlarged prostate  No complaints of urinary hesitancy.  Right bundle branch block  Chronic.  Prophylaxis Lovenox   Code Status: Full  Family CommunicationJarold, [place] [phone]; wife updated at bedside 08/25/11.  Disposition Plan: SNF, after dental surgery in 08/29/2011 team, planned for discharge on 08/30/2011.     LOS: 8 days  Faiga Stones A, MD 08/25/2011, 2:10 PM

## 2011-08-25 NOTE — Plan of Care (Signed)
Problem: Inadequate Intake (NI-2.1) Goal: Food and/or nutrient delivery Individualized approach for food/nutrient provision.  Outcome: Progressing Intake gradually improving

## 2011-08-25 NOTE — Progress Notes (Signed)
ANTIBIOTIC CONSULT NOTE - Follow up  Pharmacy Consult for Vancomycin/Zosyn Indication: Infectious process, unknown source, ?possible dental abscess  No Known Allergies  Patient Measurements: Height: 5\' 10"  (177.8 cm) Weight: 143 lb (64.864 kg) IBW/kg (Calculated) : 73   Vital Signs: Temp: 98.8 F (37.1 C) (04/18 0600) Temp src: Oral (04/18 0600) BP: 168/68 mmHg (04/18 0936) Pulse Rate: 80  (04/18 0600) Intake/Output from previous day: 04/17 0701 - 04/18 0700 In: 1258.3 [P.O.:360; I.V.:898.3] Out: -  Intake/Output from this shift: Total I/O In: 120 [P.O.:120] Out: -   Labs:  Basename 08/25/11 0515 08/24/11 0459 08/23/11 0500  WBC 12.4* 14.9* 16.8*  HGB 9.8* 10.3* 11.0*  PLT 101* 111* 118*  LABCREA -- -- --  CREATININE 1.38* 1.38* 1.71*   Estimated Creatinine Clearance: 37.2 ml/min (by C-G formula based on Cr of 1.38). Normalized CrCl ~ 25ml/min/1.73m2   Microbiology: Recent Results (from the past 720 hour(s))  CULTURE, BLOOD (ROUTINE X 2)     Status: Normal   Collection Time   08/18/11  1:19 AM      Component Value Range Status Comment   Specimen Description BLOOD R FOREARM   Final    Special Requests     Final    Value: BOTTLES DRAWN AEROBIC AND ANAEROBIC 4CC AEROBIC/3CC ANAEROBIC   Culture  Setup Time [id number]   Final    Culture NO GROWTH 5 DAYS   Final    Report Status 08/24/2011 FINAL   Final   CULTURE, BLOOD (ROUTINE X 2)     Status: Normal   Collection Time   08/18/11  1:22 AM      Component Value Range Status Comment   Specimen Description BLOOD RIGHT ANTECUBITAL   Final    Special Requests BOTTLES DRAWN AEROBIC ONLY 5C   Final    Culture  Setup Time [id number]   Final    Culture NO GROWTH 5 DAYS   Final    Report Status 08/24/2011 FINAL   Final   MRSA PCR SCREENING     Status: Normal   Collection Time   08/18/11  2:27 AM      Component Value Range Status Comment   MRSA by PCR NEGATIVE  NEGATIVE  Final   CLOSTRIDIUM DIFFICILE BY PCR      Status: Normal   Collection Time   08/19/11  3:13 AM      Component Value Range Status Comment   C difficile by pcr NEGATIVE  NEGATIVE  Final   URINE CULTURE     Status: Normal   Collection Time   08/20/11  3:09 PM      Component Value Range Status Comment   Specimen Description URINE, CLEAN CATCH   Final    Special Requests NONE   Final    Culture  Setup Time [id number]   Final    Colony Count NO GROWTH   Final    Culture NO GROWTH   Final    Report Status 08/21/2011 FINAL   Final   STOOL CULTURE     Status: Normal   Collection Time   08/20/11 10:25 PM      Component Value Range Status Comment   Specimen Description STOOL   Final    Special Requests NONE   Final    Culture     Final    Value: ABUNDANT YEAST     NO SALMONELLA, SHIGELLA, CAMPYLOBACTER, OR YERSINIA ISOLATED     Note: REDUCED NORMAL FLORA PRESENT   Report  Status 08/24/2011 FINAL   Final   CLOSTRIDIUM DIFFICILE BY PCR     Status: Normal   Collection Time   08/24/11  8:32 PM      Component Value Range Status Comment   C difficile by pcr NEGATIVE  NEGATIVE  Final     Medical History: Past Medical History  Diagnosis Date  . COPD (chronic obstructive pulmonary disease)   . Osteoporosis   . GERD (gastroesophageal reflux disease)   . Hypertension   . Anemia   . Arthritis   . Depression   . Cerebrovascular disease, unspecified 02/2008    Carotid U/S- right internal carotid 60-79%, left internal carotid artery 40-59%  . Secondary hyperparathyroidism (of renal origin)   . Other and unspecified hyperlipidemia   . Peripheral vascular disease, unspecified   . Atrial fibrillation   . Unspecified disorder resulting from impaired renal function   . Hiatal hernia   . Hyperkalemia     Due to ACE Inhibitors  . Varicosities     LE  . CVA (cerebral infarction)   . Lumbago   . Lumbosacral spondylosis without myelopathy   . Chronic pain syndrome   . Diverticulosis 08/18/2011  . Enlarged prostate 08/18/2011  . CKD  (chronic kidney disease) stage 3, GFR 30-59 ml/min 08/18/2011  . Left renal mass 08/18/2011  . Right bundle branch block 08/18/2011  . Diastolic CHF, chronic 08/19/2011    Study Conclusions  - Left ventricle: The cavity size was normal. Wall thickness was normal. Systolic function was normal. The estimated ejection fraction was in the range of 55% to 60%. Images were inadequate for LV wall motion assessment. Doppler parameters are consistent with abnormal left ventricular relaxation (grade 1 diastolic dysfunction). - Aortic valve: Poorly visualized. - Mitral valve: Poorly visualized. - Left atrium: Poorly visualized. - Right ventricle: Poorly visualized. Probably normal size/systolic function. - Tricuspid valve: Poorly visualized. - Pulmonic valve: Poorly visualized. - Systemic veins: IVC not visualized. Impressions:  - Grossly normal LV systolic function. Otherwise, this echo is not technically adequate to accurately comment on other structures.       Medications:  Scheduled:     . amLODipine  5 mg Oral Daily  . aspirin EC  81 mg Oral Daily  . calcitRIOL  0.25 mcg Oral Daily  . citalopram  20 mg Oral Daily  . Flora-Q  1 capsule Oral Daily  . piperacillin-tazobactam (ZOSYN)  IV  3.375 g Intravenous Q8H  . potassium chloride  40 mEq Oral BID  . sodium chloride  3 mL Intravenous Q12H  . tiotropium  18 mcg Inhalation Daily  . vancomycin  1,000 mg Intravenous Q24H   Infusions:     . dextrose 5 % 1,000 mL with potassium chloride 40 mEq, sodium bicarbonate 150 mEq infusion 100 mL/hr at 08/25/11 0704    Assessment:  Day #6 Zosyn 3.375gm IV q8h/D#4 Vanc 1g IV q24h for empiric coverage of pna, uti, gallbladder infection, sepsis of unknown etiology. Possible dental abscess, teeth extraction planned for Monday AM  Afeb, all cultures negative to date  Scr improved, current abx doses remain appropriate.  Goal of Therapy:  Vancomycin troughs 15-20 mcg/ml  Plan:   Check vancomycin trough  tonight.   Continue zosyn as ordered  Gwen Her PharmD  [phone] 08/25/2011 10:31 AM

## 2011-08-25 NOTE — Consult Note (Signed)
DENTAL CONSULTATION  Date of Consultation:  08/25/2011 Patient Name:   Brandon [name] Date of Birth:   12-03-1925 Medical Record Number: [id number]  VITALS: BP 168/68  Pulse 80  Temp(Src) 98.8 F (37.1 C) (Oral)  Resp 18  Ht 5\' 10"  (1.778 m)  Wt 143 lb (64.864 kg)  BMI 20.52 kg/m2  SpO2 96%   CHIEF COMPLAINT: Dental consultation requested to evaluate possible dental abscess and to provide treatment is indicated.  HPI: Brandon [name] is an 78 year old male referred by Dr. Betti Cruz for a dental consultation. Patient was recently admitted with sepsis of unknown origin. Patient found to have poor dentition. Orthopantogram was ordered. Patient found to have possible dental abscess. Dental consultation then requested to rule out dental infection that may affect the patient's systemic health and to provide treatment as indicated. Patient is currently on IV antibiotic therapy.  Patient currently denies acute toothache, swellings, or oral abscesses. Patient was last seen by dentist 3 years ago to have an upper partial denture fabricated. Patient does not seek regular dental care. Patient has upper and lower partial dentures, but did not bring his lower partial denture with him from home. Patient indicates the upper partial fits" okay". Patient indicates that he does not wear the lower partial denture that much anymore.  Patient Active Problem List  Diagnoses  . DYSLIPIDEMIA  . ANEMIA OF CHRONIC DISEASE  . DEPRESSION  . MYOCARDIAL INFARCTION, HX OF  . Atrial fibrillation  . CEREBROVASCULAR DISEASE  . COPD  . GERD  . SECONDARY HYPERPARATHYROIDISM  . Bladder neck obstruction  . Diarrhea  . Alzheimer's disease  . Abnormal LFTs  . Leukocytosis  . Metabolic acidosis, normal anion gap (NAG)  . Hyponatremia  . Hypokalemia  . CKD (chronic kidney disease) stage 3, GFR 30-59 ml/min  . Left renal mass  . Enlarged prostate  . Diverticulosis  . Right bundle branch block  . Diastolic CHF,  chronic  . NSVT (nonsustained ventricular tachycardia)  . Chronic apical periodontitis  . Sepsis    PMH: Past Medical History  Diagnosis Date  . COPD (chronic obstructive pulmonary disease)   . Osteoporosis   . GERD (gastroesophageal reflux disease)   . Hypertension   . Anemia   . Arthritis   . Depression   . Cerebrovascular disease, unspecified 02/2008    Carotid U/S- right internal carotid 60-79%, left internal carotid artery 40-59%  . Secondary hyperparathyroidism (of renal origin)   . Other and unspecified hyperlipidemia   . Peripheral vascular disease, unspecified   . Atrial fibrillation   . Unspecified disorder resulting from impaired renal function   . Hiatal hernia   . Hyperkalemia     Due to ACE Inhibitors  . Varicosities     LE  . CVA (cerebral infarction)   . Lumbago   . Lumbosacral spondylosis without myelopathy   . Chronic pain syndrome   . Diverticulosis 08/18/2011  . Enlarged prostate 08/18/2011  . CKD (chronic kidney disease) stage 3, GFR 30-59 ml/min 08/18/2011  . Left renal mass 08/18/2011  . Right bundle branch block 08/18/2011  . Diastolic CHF, chronic 08/19/2011    Study Conclusions  - Left ventricle: The cavity size was normal. Wall thickness was normal. Systolic function was normal. The estimated ejection fraction was in the range of 55% to 60%. Images were inadequate for LV wall motion assessment. Doppler parameters are consistent with abnormal left ventricular relaxation (grade 1 diastolic dysfunction). - Aortic valve: Poorly visualized. - Mitral valve:  Poorly visualized. - Left atrium: Poorly visualized. - Right ventricle: Poorly visualized. Probably normal size/systolic function. - Tricuspid valve: Poorly visualized. - Pulmonic valve: Poorly visualized. - Systemic veins: IVC not visualized. Impressions:  - Grossly normal LV systolic function. Otherwise, this echo is not technically adequate to accurately comment on other structures.       PSH: Past  Surgical History  Procedure Date  . Lumbar fusion 01/2008    ALLERGIES: No Known Allergies  MEDICATIONS: Current Facility-Administered Medications  Medication Dose Route Frequency Provider Last Rate Last Dose  . acetaminophen (TYLENOL) tablet 650 mg  650 mg Oral Q6H PRN Edsel Petrin, DO       Or  . acetaminophen (TYLENOL) suppository 650 mg  650 mg Rectal Q6H PRN Edsel Petrin, DO      . albuterol (PROVENTIL) (5 MG/ML) 0.5% nebulizer solution 2.5 mg  2.5 mg Nebulization Q4H PRN Zannie Cove, MD      . ALPRAZolam Prudy Feeler) tablet 0.25 mg  0.25 mg Oral QHS PRN Maryruth Bun Rama, MD      . alum & mag hydroxide-simeth (MAALOX/MYLANTA) 200-200-20 MG/5ML suspension 30 mL  30 mL Oral Q6H PRN Edsel Petrin, DO   30 mL at 08/18/11 1839  . amLODipine (NORVASC) tablet 5 mg  5 mg Oral Daily Alison Murray, MD   5 mg at 08/25/11 3086  . aspirin EC tablet 81 mg  81 mg Oral Daily Edsel Petrin, DO   81 mg at 08/25/11 5784  . calcitRIOL (ROCALTROL) capsule 0.25 mcg  0.25 mcg Oral Daily Maryruth Bun Rama, MD   0.25 mcg at 08/25/11 0937  . citalopram (CELEXA) tablet 20 mg  20 mg Oral Daily Edsel Petrin, DO   20 mg at 08/25/11 6962  . dextrose 5 % 1,000 mL with potassium chloride 40 mEq, sodium bicarbonate 150 mEq infusion   Intravenous Continuous Maryruth Bun Rama, MD 100 mL/hr at 08/25/11 0704    . feeding supplement (NEPRO CARB STEADY) liquid 237 mL  237 mL Oral Daily PRN Anastasio Champion, RD      . Flora-Q (FLORA-Q) Capsule 1 capsule  1 capsule Oral Daily Maryruth Bun Rama, MD   1 capsule at 08/25/11 0936  . ondansetron (ZOFRAN) tablet 4 mg  4 mg Oral Q6H PRN Edsel Petrin, DO       Or  . ondansetron [facility]) injection 4 mg  4 mg Intravenous Q6H PRN Edsel Petrin, DO   4 mg at 08/21/11 1626  . piperacillin-tazobactam (ZOSYN) IVPB 3.375 g  3.375 g Intravenous [address] [place], [place]   3.375 g at 08/25/11 0549  . potassium chloride SA (K-DUR,KLOR-CON) CR  tablet 40 mEq  40 mEq Oral BID Maryruth Bun Rama, MD   40 mEq at 08/25/11 0937  . sodium chloride 0.9 % injection 3 mL  3 mL Intravenous Q12H Edsel Petrin, DO   3 mL at 08/25/11 0956  . tiotropium (SPIRIVA) inhalation capsule 18 mcg  18 mcg Inhalation Daily Maryruth Bun Rama, MD   18 mcg at 08/25/11 0920  . traMADol-acetaminophen (ULTRACET) 37.5-325 MG per tablet 1 tablet  1 tablet Oral Q12H PRN Maryruth Bun Rama, MD      . vancomycin (VANCOCIN) IVPB 1000 mg/200 mL premix  1,000 mg Intravenous Q24H Rollene Fare, PHARMD   1,000 mg at 08/24/11 1650    LABS: Lab Results  Component Value Date   WBC 12.4* 08/25/2011   HGB 9.8* 08/25/2011  HCT 31.4* 08/25/2011   MCV 93.5 08/25/2011   PLT 101* 08/25/2011      Component Value Date/Time   NA 144 08/25/2011 0515   K 2.9* 08/25/2011 0515   CL 109 08/25/2011 0515   CO2 26 08/25/2011 0515   GLUCOSE 113* 08/25/2011 0515   BUN 18 08/25/2011 0515   CREATININE 1.38* 08/25/2011 0515   CALCIUM 8.8 08/25/2011 0515   CALCIUM 9.2 07/18/2011 0947   GFRNONAA 46* 08/25/2011 0515   GFRAA 53* 08/25/2011 0515   No results found for this basename: INR, PROTIME   No results found for this basename: PTT    SOCIAL HISTORY: History   Social History  . Marital Status: Married    Spouse Name: Erskine Squibb    Number of Children: Y  . Years of Education: N/A   Occupational History  . Art gallery manager     retired   Social History Main Topics  . Smoking status: Former Smoker -- 1.0 packs/day for 38 years    Types: Cigarettes    Quit date: 05/09/1982  . Smokeless tobacco: Not on file  . Alcohol Use: No  . Drug Use: No  . Sexually Active: Not on file   Other Topics Concern  . Not on file   Social History Narrative  . No narrative on file    FAMILY HISTORY: Family History  Problem Relation Age of Onset  . Cancer Neg Hx     Colon Cancer  . Stroke Father   . Coronary artery disease Father   . Heart attack Father      REVIEW OF SYSTEMS: Reviewed from chart  for this admission.  DENTAL HISTORY: CHIEF COMPLAINT: Dental consultation requested to evaluate possible dental abscess and to provide treatment is indicated.  HPI: TERRACE FONTANILLA is an 78 year old male referred by Dr. Betti Cruz for a dental consultation. Patient was recently admitted with sepsis of unknown origin. Patient found to have poor dentition. Orthopantogram was ordered. Patient found to have possible dental abscess. Dental consultation then requested to rule out dental infection that may affect the patient's systemic health and to provide treatment as indicated. Patient is currently on IV antibiotic therapy.  Patient currently denies acute toothache, swellings, or oral abscesses. Patient was last seen by dentist 3 years ago to have an upper partial denture fabricated. Patient does not seek regular dental care. Patient has upper and lower partial dentures, but did not bring his lower partial denture with him from home. Patient indicates the upper partial fits" okay". Patient indicates that he does not wear the lower partial denture that much anymore.   DENTAL EXAMINATION:  GENERAL: Patient is a well-developed, slightly built male in no acute distress that is lying in his hospital bed. HEAD AND NECK: There is no obvious of mandibular lymphadenopathy. The patient denies acute TMJ symptoms. INTRAORAL EXAM: patient has a dry mouth. I do not see any evidence of abscess formation within the mouth at this time. DENTITION: Patient with multiple missing teeth. Patient with multiple retained root segments broken off at the gum line. PERIODONTAL: Patient with chronic periodontitis with plaque and calculus collations, gingival recession, and tooth mobility. DENTAL CARIES/SUBOPTIMAL RESTORATIONS: There are multiple dental caries noted. ENDODONTIC: Patient currently denies acute pulpitis symptoms. Patient does have periapical pathology and radiolucencies. CROWN AND BRIDGE: There are several crowns that  appear to be acceptable. PROSTHODONTIC: Patient with an upper acrylic partial denture that is less than ideal. Due to the multiple retained root segments, I doubt that the  lower partial denture is acceptable. The lower partial denture however, is at home, and I was unable to evaluate the clinical acceptability of this prosthesis. OCCLUSION: Patient with a poor occlusal scheme secondary to multiple missing teeth, multiple retained root segments, and lack of replacement of the missing teeth with clinically acceptable dental prostheses.  RADIOGRAPHIC INTERPRETATION: An orthopantogram was taken by the department of radiology on 08/23/2011. There are multiple missing teeth. There are multiple retained root segments. There are multiple areas of apical radiolucency and pathology. There are dental caries noted. There is supra-eruption and drifting of the unopposed teeth into the edentulous areas.   ASSESSMENTS: 1. Chronic apical periodontitis 2. Chronic periodontitis 3. Gingival recession 4. Tooth mobility 5. Multiple missing teeth 6. Multiple retained root segments 7. Ill fitting partial dentures 8. Supra-eruption and drifting of the unopposed teeth into the edentulous areas 9. Poor occlusal scheme and malocclusion  10. History of sepsis with possible dental etiology. 11. Significant medical comorbidities with risk for complications up to and including death for the anticipated dental procedures in the operating room.    PLAN/RECOMMENDATIONS: 1. I discussed the risks, benefits, and complications of various treatment options with the patient in relationship to his medical and dental conditions. We discussed various treatment options to include no treatment, multiple extractions with alveoloplasty, pre-prosthetic surgery as indicated, periodontal therapy, dental restorations, root canal therapy, crown and bridge therapy, implant therapy, and replacement of missing teeth as indicated. The patient  currently wishes to proceed with extraction of remaining teeth with alveoloplasty and pre-prosthetic surgery as indicated in the operating room. This is been scheduled for Monday, 08/29/2011 at 9 AM at [facility].  After the extractions, the patient will need followup with a dentist of his choice for fabrication of upper and lower complete dentures after adequate healing.   2. Discussion of findings with medical team and coordination of future medical and dental care.    Charlynne Pander, DDS

## 2011-08-26 DIAGNOSIS — D696 Thrombocytopenia, unspecified: Secondary | ICD-10-CM | POA: Diagnosis present

## 2011-08-26 LAB — CBC
HCT: 31.9 % — ABNORMAL LOW (ref 39.0–52.0)
MCV: 94.9 fL (ref 78.0–100.0)
RBC: 3.36 MIL/uL — ABNORMAL LOW (ref 4.22–5.81)
RDW: 14.5 % (ref 11.5–15.5)
WBC: 11.7 10*3/uL — ABNORMAL HIGH (ref 4.0–10.5)

## 2011-08-26 LAB — BASIC METABOLIC PANEL
BUN: 16 mg/dL (ref 6–23)
CO2: 25 mEq/L (ref 19–32)
Chloride: 110 mEq/L (ref 96–112)
Creatinine, Ser: 1.28 mg/dL (ref 0.50–1.35)
GFR calc Af Amer: 58 mL/min — ABNORMAL LOW (ref >90)
Potassium: 2.6 mEq/L — CL (ref 3.5–5.1)

## 2011-08-26 LAB — MAGNESIUM: Magnesium: 1.5 mg/dL (ref 1.5–2.5)

## 2011-08-26 MED ORDER — MAGNESIUM SULFATE 40 MG/ML IJ SOLN
2.0000 g | Freq: Once | INTRAMUSCULAR | Status: AC
  Administered 2011-08-26: 2 g via INTRAVENOUS
  Filled 2011-08-26: qty 50

## 2011-08-26 MED ORDER — POTASSIUM CHLORIDE 10 MEQ/100ML IV SOLN
10.0000 meq | INTRAVENOUS | Status: AC
  Administered 2011-08-26 (×4): 10 meq via INTRAVENOUS
  Filled 2011-08-26 (×4): qty 100

## 2011-08-26 MED ORDER — POTASSIUM CHLORIDE CRYS ER 20 MEQ PO TBCR
20.0000 meq | EXTENDED_RELEASE_TABLET | Freq: Once | ORAL | Status: AC
  Administered 2011-08-26: 20 meq via ORAL
  Filled 2011-08-26: qty 1

## 2011-08-26 NOTE — Progress Notes (Signed)
Subjective: Patient expressed concerned about how he was going to swallow after having oral surgery. Blood pressure improved. No other specific concerns.  Objective: Vital signs in last 24 hours: Filed Vitals:   08/25/11 1145 08/25/11 1430 08/25/11 2121 08/26/11 0545  BP:  146/62 102/71 113/69  Pulse:  80 88 86  Temp: 99.8 F (37.7 C) 99 F (37.2 C) 99.8 F (37.7 C) 98.6 F (37 C)  TempSrc: Oral Oral Oral Oral  Resp:  20 18 18   Height:      Weight:      SpO2:  98% 96% 95%   Weight change:   Intake/Output Summary (Last 24 hours) at 08/26/11 1016 Last data filed at 08/25/11 1234  Gross per 24 hour  Intake    120 ml  Output      0 ml  Net    120 ml    Physical Exam: General: Awake, Oriented, No acute distress. HEENT: EOMI, nasal cannula in place. Neck: Supple CV: S1 and S2 Lungs: Clear to ascultation bilaterally Abdomen: Soft, Nontender, Nondistended, +bowel sounds. Ext: Good pulses. Trace edema.  Lab Results:  [facility] 08/26/11 0449 08/25/11 0515  NA 145 144  K 2.6* 2.9*  CL 110 109  CO2 25 26  GLUCOSE 104* 113*  BUN 16 18  CREATININE 1.28 1.38*  CALCIUM 8.9 8.8  MG 1.5 --  PHOS -- --    Basename 08/24/11 0459  AST 26  ALT 67*  ALKPHOS 275*  BILITOT 0.5  PROT 5.6*  ALBUMIN 2.5*   No results found for this basename: LIPASE:2,AMYLASE:2 in the last 72 hours  Basename 08/26/11 0449 08/25/11 0515  WBC 11.7* 12.4*  NEUTROABS -- --  HGB 9.9* 9.8*  HCT 31.9* 31.4*  MCV 94.9 93.5  PLT 102* 101*   No results found for this basename: CKTOTAL:3,CKMB:3,CKMBINDEX:3,TROPONINI:3 in the last 72 hours No components found with this basename: POCBNP:3 No results found for this basename: DDIMER:2 in the last 72 hours No results found for this basename: HGBA1C:2 in the last 72 hours No results found for this basename: CHOL:2,HDL:2,LDLCALC:2,TRIG:2,CHOLHDL:2,LDLDIRECT:2 in the last 72 hours No results found for this basename: TSH,T4TOTAL,FREET3,T3FREE,THYROIDAB in  the last 72 hours No results found for this basename: VITAMINB12:2,FOLATE:2,FERRITIN:2,TIBC:2,IRON:2,RETICCTPCT:2 in the last 72 hours  Micro Results: Recent Results (from the past 240 hour(s))  CULTURE, BLOOD (ROUTINE X 2)     Status: Normal   Collection Time   08/18/11  1:19 AM      Component Value Range Status Comment   Specimen Description BLOOD R FOREARM   Final    Special Requests     Final    Value: BOTTLES DRAWN AEROBIC AND ANAEROBIC 4CC AEROBIC/3CC ANAEROBIC   Culture  Setup Time [id number]   Final    Culture NO GROWTH 5 DAYS   Final    Report Status 08/24/2011 FINAL   Final   CULTURE, BLOOD (ROUTINE X 2)     Status: Normal   Collection Time   08/18/11  1:22 AM      Component Value Range Status Comment   Specimen Description BLOOD RIGHT ANTECUBITAL   Final    Special Requests BOTTLES DRAWN AEROBIC ONLY 5C   Final    Culture  Setup Time [id number]   Final    Culture NO GROWTH 5 DAYS   Final    Report Status 08/24/2011 FINAL   Final   MRSA PCR SCREENING     Status: Normal   Collection Time   08/18/11  2:27 AM      Component Value Range Status Comment   MRSA by PCR NEGATIVE  NEGATIVE  Final   CLOSTRIDIUM DIFFICILE BY PCR     Status: Normal   Collection Time   08/19/11  3:13 AM      Component Value Range Status Comment   C difficile by pcr NEGATIVE  NEGATIVE  Final   URINE CULTURE     Status: Normal   Collection Time   08/20/11  3:09 PM      Component Value Range Status Comment   Specimen Description URINE, CLEAN CATCH   Final    Special Requests NONE   Final    Culture  Setup Time [id number]   Final    Colony Count NO GROWTH   Final    Culture NO GROWTH   Final    Report Status 08/21/2011 FINAL   Final   STOOL CULTURE     Status: Normal   Collection Time   08/20/11 10:25 PM      Component Value Range Status Comment   Specimen Description STOOL   Final    Special Requests NONE   Final    Culture     Final    Value: ABUNDANT YEAST     NO SALMONELLA, SHIGELLA,  CAMPYLOBACTER, OR YERSINIA ISOLATED     Note: REDUCED NORMAL FLORA PRESENT   Report Status 08/24/2011 FINAL   Final   CLOSTRIDIUM DIFFICILE BY PCR     Status: Normal   Collection Time   08/24/11  8:32 PM      Component Value Range Status Comment   C difficile by pcr NEGATIVE  NEGATIVE  Final     Studies/Results: No results found.  Medications: I have reviewed the patient's current medications. Scheduled Meds:    . amLODipine  10 mg Oral Daily  . amLODipine  5 mg Oral Once  . amoxicillin-clavulanate  1 tablet Oral TID  . aspirin EC  81 mg Oral Daily  . calcitRIOL  0.25 mcg Oral Daily  . citalopram  20 mg Oral Daily  . enoxaparin (LOVENOX) injection  30 mg Subcutaneous Q24H  . feeding supplement  237 mL Oral TID WC  . Flora-Q  1 capsule Oral Daily  . magnesium sulfate 1 - 4 g bolus IVPB  2 g Intravenous Once  . potassium chloride  10 mEq Intravenous Q1 Hr x 4  . potassium chloride  20 mEq Oral Once  . potassium chloride  40 mEq Oral BID  . sodium chloride  3 mL Intravenous Q12H  . tiotropium  18 mcg Inhalation Daily  . DISCONTD: amLODipine  5 mg Oral Daily  . DISCONTD: piperacillin-tazobactam (ZOSYN)  IV  3.375 g Intravenous Q8H  . DISCONTD: potassium chloride  40 mEq Oral BID  . DISCONTD: potassium chloride  40 mEq Oral BID  . DISCONTD: vancomycin  1,000 mg Intravenous Q24H   Continuous Infusions:    . DISCONTD: dextrose 5 % 1,000 mL with potassium chloride 40 mEq, sodium bicarbonate 150 mEq infusion 100 mL/hr at 08/25/11 0704   PRN Meds:.acetaminophen, acetaminophen, albuterol, ALPRAZolam, alum & mag hydroxide-simeth, ondansetron (ZOFRAN) IV, ondansetron, traMADol-acetaminophen, DISCONTD: feeding supplement (NEPRO CARB STEADY)  Assessment/Plan: Consultants:  Dr. Yancey Flemings, Gastroenterology, signed off. Dental surgery - Dr. Kristin Bruins Physical therapy Antibiotics:  Rocephin 08/18/11--->Ceftin 08/19/11.  Flagyl 08/18/11--->08/19/11  Zosyn 08/18/11--->08/18/11, Restarted  08/20/11----> 08/25/2011 Cipro 08/18/11--->08/18/11  Vancomycin 08/22/11--->08/25/2011 Augmentin 08/25/2011--->  Diarrhea / Sepsis / Hypotension / Leukocytosis / Metabolic acidosis, normal  anion gap (NAG) / Pyelonephritis vs. Cholecystitis vs. Occult infection  Initially, had Index of suspicion initially high for C. difficile colitis given recent antibiotic use and high white blood cell count, but C. difficile PCR studies were negative. Stool cultures on 08/20/2011 was negative.  Metabolic acidosis likely from sepsis and bicarbonate losses in the stool which resolved with bicarbonate replacement therapy.  Initially treated empirically with Flagyl until C. difficile studies came back negative and Flagyl was discontinued.  Was also on initially on empiric Rocephin for the possibility of other sources of infection including the lungs and the urinary tract. Unfortunately, urine cultures not checked at the time of admission. Urine cultures sent on 08/20/11, but were negative, as expected.  CT scan of abdomen showed possible gallbladder inflammation as well as perinephric stranding. Abdominal ultrasound not consistent with cholecystitis so source of infection felt to be pyelonephritis.  Antibiotics broadened to Zosyn on 08/20/11 to cover pneumonia, pyelonephritis and cholecystitis. WBC still elevated, despite change.  Blood cultures negative to date.  GI consult for help with management: HIDA scan ordered - results with no significant findings; GI signed off 08/24/2011  Vancomycin added 08/22/11 to cover Staph.  MRI lumbar spine done on 08/22/2011 to rule out diskitis, given h/o back pain and lumbar laminectomy, which was negative.  Orthopantogram performed on 08/23/2011 showed dental abscess and dental surgery consulted. Probiotics added for/16/2013 per wife's request.  Suspect patient's sepsis may have been from dental abscess.  Suspect patient's diarrhea may have been from antibiotic use.  Dental Abscess    Appreciate input from Dr. Kristin Bruins, Orthopantogram performed on 08/23/2011 showed dental abscess. Patient has been scheduled for scheduled for surgery on 08/29/2011 for extraction of remaining teeth with alveoloplasty and pre-prosthetic surgery. Patient will need followup with a dentist of his choice for fabrication of upper and lower complete dentures as outpatient. Transition the patient from Zosyn and vancomycin to Augmentin.  Antibiotics since 08/18/2011.  Defined a total of 10 day course.  ANEMIA OF CHRONIC DISEASE  Normocytic anemia, likely of chronic disease and due to chronic kidney disease.  Hemoglobin stable.  On Aranesp as an outpatient.  History of coronary artery disease/myocardial infarction/Elevated CK-MB  Troponins were negative x 3.  No complaints of chest pain at this time.   2-D echocardiogram on 08/18/2011 showed ejection fraction of 55-60%. Continue aspirin.  History of Atrial fibrillation / nonsustained ventricular tachycardia  Maintaining normal sinus rhythm on telemetry.  One episode of nonsustained ventricular tachycardia, likely exacerbated by hypokalemia. Repleting potassium. Patient not on anticoagulation.  PERIPHERAL VASCULAR DISEASE  Continue aspirin.  COPD / Dyspnea / Hypoxia  Oxygen saturations maintained above 90%, continue nebulized bronchodilator therapy as needed.  Continue Spiriva. Increased chest congestion noted 08/20/11 felt to be secondary to pulmonary edema, pro-BNP was elevated. Responded to Lasix given 08/20/11.  SECONDARY HYPERPARATHYROIDISM  Continue calcitriol.  Alzheimer's disease  Appears stable.  AKI (acute kidney injury) / CKD (chronic kidney disease) stage 3, GFR 30-59 ml/min  Acute renal failure likely secondary to sepsis. Creatinine back to baseline with gentle IVF. IVF stopped due to concerns for pulmonary edema, resumed 08/22/11.  Patient's saline lock on 08/25/2011.  Continue to hold hyzaar.  Abnormal LFTs  Likely secondary  to sepsis, improving.  Transaminases and alkaline phosphatase still markedly abnormal.  Abdominal ultrasound to evaluate gallbladder/biliary tree performed on 08/19/2011. No significant evidence of cholecystitis.  Statin on hold consider resuming statin at discharge.  Elevated brain natriuretic peptide (BNP) level / chronic diastolic CHF  Chest x-ray clear with no evidence of pulmonary edema on admission. Suspect initial elevation of pro-BNP secondary to decreased renal clearance and chronic diastolic dysfunction.  Repeat two-dimensional echocardiogram showed normal LV function, grade I diastolic dysfunction.  Lasix 40 mg IV x1 given on 08/20/2011 for increased chest congestion.   Hyponatremia  Likely secondary to dehydration from GI losses, resolved with gentle IV fluids.  Hypokalemia  Secondary to GI losses, repleting orally. Continue replace as needed.  Left renal mass  Consistent with a complex cyst on ultrasound on 08/19/1998 team.  Will need further evaluation as outpatient, with followup imaging in 3 months.  Enlarged prostate  No complaints of urinary hesitancy.  Right bundle branch block  Chronic.  Thrombocytopenia Chronic.  Continue to monitor.  Prophylaxis Lovenox   Code Status: Full  Family CommunicationJeanpierre, [place] [phone]; wife updated By telephone 08/26/11.  Disposition Plan: SNF, after dental surgery in 08/29/2011, plan for discharge on 08/30/2011.     LOS: 9 days  Karie Skowron A, MD 08/26/2011, 10:16 AM

## 2011-08-26 NOTE — Progress Notes (Signed)
Pre-OPERATIVE NOTE:  08/26/2011 Brandon [name] [id number]  VITALS: BP 169/75  Pulse 87  Temp(Src) 99.3 F (37.4 C) (Oral)  Resp 18  Ht 5\' 10"  (1.778 m)  Wt 143 lb (64.864 kg)  BMI 20.52 kg/m2  SpO2 96%  Brandon [name] is scheduled for multiple extraction with alveoloplasty and pre-prosthetic surgery as indicated in the operating room on Monday, 08/29/2011 at 9 AM. Wife and daughter in the room with him today. We had an extensive discussion of the risks, benefits, competitions various treatment options for the patient and the plan for extraction remaining teeth with alveoloplasty and pre-prosthetic surgery as indicated. All questions were answered for the patient, wife, and daughter. Patient is to followup with their primary dentist for fabrication of upper lower complete dentures after adequate healing. Family indicates that Dr. Alden Callas is the primary dentist.  SUBJECTIVE: Patient indicates that he feels better today. Patient is interested in proceeding with treatment as planned   EXAM: No acute dental changes. Dental disease as before  ASSESSMENT: Multiple dental problems with chronic apical periodontitis, multiple retained root segments, dental caries, and malocclusion.  PLAN: OR scheduled for Monday, 08/29/2011 at 9 AM.   Charlynne Pander, DDS

## 2011-08-26 NOTE — Progress Notes (Signed)
Physical Therapy Treatment Patient Details Name: Brandon [name] MRN: [id number] DOB: 06/09/1925 Today's Date: 08/26/2011 Time:  1449- 1516    PT Assessment / Plan / Recommendation Comments on Treatment Session  Able to progress ambulation this session, but pt required O2 during session. O2 sats 89% on 3L O2 during session.Very SOB at end of session. Planning to have dental surgery on Monday and to rehab on Tuesday.     Follow Up Recommendations  Skilled nursing facility    Equipment Recommendations  Defer to next venue    Frequency Min 3X/week   Plan Discharge plan remains appropriate    Precautions / Restrictions Precautions Precautions: Fall   Pertinent Vitals/Pain     Mobility  Bed Mobility Bed Mobility: Supine to Sit;Sit to Supine Supine to Sit: 5: Supervision Sit to Supine: 5: Supervision Transfers Transfers: Sit to Stand;Stand to Sit Sit to Stand: 4: Min guard;From toilet;With upper extremity assist;From bed Stand to Sit: 4: Min guard;To bed;To toilet;With upper extremity assist Ambulation/Gait Ambulation/Gait Assistance: 4: Min assist Ambulation Distance (Feet): 135 Feet Assistive device: Rolling walker Ambulation/Gait Assistance Details: 1 standing rest break midpoint of ambulation. Dyspnea 2/4 with ambulation in hallway; 3-4/4 after pt came out of bathroom. O2 sats 89% on 3L O2 with activity. VCs for pursed lip breathing. Assist to steady intermittently Gait Pattern: Step-through pattern;Decreased stride length;Trunk flexed    Exercises     PT Goals Acute Rehab PT Goals Time For Goal Achievement: 09/04/11 PT Goal: Supine/Side to Sit - Progress: Progressing toward goal PT Goal: Sit to Supine/Side - Progress: Progressing toward goal PT Goal: Sit to Stand - Progress: Progressing toward goal PT Goal: Stand to Sit - Progress: Progressing toward goal PT Goal: Ambulate - Progress: Progressing toward goal  Visit Information  Last PT Received On:  08/26/11 Assistance Needed: +1    Subjective Data  Subjective: "I'm out of breath" Patient Stated Goal: Rehab to get stronger   Cognition  Overall Cognitive Status: Appears within functional limits for tasks assessed/performed Arousal/Alertness: Awake/alert Orientation Level: Appears intact for tasks assessed Behavior During Session: [facility] for tasks performed    Balance     End of Session PT - End of Session Equipment Utilized During Treatment: Gait belt (O2) Activity Tolerance: Patient limited by fatigue (Limited by dyspnea) Patient left: with call bell/phone within reach;in bed;with family/visitor present    [name] [facility] 08/26/2011, 3:20 PM [phone]

## 2011-08-26 NOTE — Progress Notes (Signed)
Lab called with a critical potassium level of 2.6 attempt made to contact Dr. Betti Cruz awaiting on call back from MD.

## 2011-08-27 ENCOUNTER — Inpatient Hospital Stay (HOSPITAL_COMMUNITY): Payer: Medicare Other

## 2011-08-27 LAB — CBC
HCT: 34.1 % — ABNORMAL LOW (ref 39.0–52.0)
Hemoglobin: 10.6 g/dL — ABNORMAL LOW (ref 13.0–17.0)
MCH: 29.4 pg (ref 26.0–34.0)
MCHC: 31.1 g/dL (ref 30.0–36.0)
RBC: 3.6 MIL/uL — ABNORMAL LOW (ref 4.22–5.81)

## 2011-08-27 LAB — BASIC METABOLIC PANEL
BUN: 15 mg/dL (ref 6–23)
CO2: 21 mEq/L (ref 19–32)
Glucose, Bld: 110 mg/dL — ABNORMAL HIGH (ref 70–99)
Potassium: 3.3 mEq/L — ABNORMAL LOW (ref 3.5–5.1)
Sodium: 141 mEq/L (ref 135–145)

## 2011-08-27 MED ORDER — POTASSIUM CHLORIDE CRYS ER 20 MEQ PO TBCR
40.0000 meq | EXTENDED_RELEASE_TABLET | Freq: Once | ORAL | Status: AC
  Administered 2011-08-27: 40 meq via ORAL
  Filled 2011-08-27: qty 2

## 2011-08-27 MED ORDER — POTASSIUM CHLORIDE CRYS ER 20 MEQ PO TBCR
40.0000 meq | EXTENDED_RELEASE_TABLET | Freq: Once | ORAL | Status: AC
Start: 2011-08-27 — End: 2011-08-27
  Administered 2011-08-27: 40 meq via ORAL
  Filled 2011-08-27: qty 2

## 2011-08-27 MED ORDER — FUROSEMIDE 10 MG/ML IJ SOLN
20.0000 mg | Freq: Once | INTRAMUSCULAR | Status: AC
  Administered 2011-08-27: 20 mg via INTRAVENOUS
  Filled 2011-08-27: qty 2

## 2011-08-27 NOTE — Progress Notes (Signed)
Subjective: Initially on my examination of the patient he did not have any complaints, and did not complain of shortness of breath.  Later on nursing staff noted that he was more wheezy and short of breath.  He has been having good bowel movements.  Objective: Vital signs in last 24 hours: Filed Vitals:   08/27/11 0012 08/27/11 0600 08/27/11 0921 08/27/11 0934  BP:  152/66    Pulse: 87 90    Temp: 98.8 F (37.1 C) 99.7 F (37.6 C)    TempSrc: Oral Oral    Resp: 18 19    Height:      Weight:      SpO2:  93% 88% 93%   Weight change:   Intake/Output Summary (Last 24 hours) at 08/27/11 1046 Last data filed at 08/27/11 0949  Gross per 24 hour  Intake      3 ml  Output      0 ml  Net      3 ml    Physical Exam: General: Awake, Oriented, No acute distress. HEENT: EOMI, nasal cannula in place. Neck: Supple CV: S1 and S2 Lungs: Clear to ascultation bilaterally Abdomen: Soft, Nontender, slightly distended, +bowel sounds. Ext: Good pulses. Trace edema.  Lab Results:  Basename 08/27/11 0513 08/26/11 0449  NA 141 145  K 3.3* 2.6*  CL 109 110  CO2 21 25  GLUCOSE 110* 104*  BUN 15 16  CREATININE 1.39* 1.28  CALCIUM 9.1 8.9  MG -- 1.5  PHOS -- --   No results found for this basename: AST:2,ALT:2,ALKPHOS:2,BILITOT:2,PROT:2,ALBUMIN:2 in the last 72 hours No results found for this basename: LIPASE:2,AMYLASE:2 in the last 72 hours  Basename 08/27/11 0513 08/26/11 0449  WBC 13.3* 11.7*  NEUTROABS -- --  HGB 10.6* 9.9*  HCT 34.1* 31.9*  MCV 94.7 94.9  PLT 122* 102*   No results found for this basename: CKTOTAL:3,CKMB:3,CKMBINDEX:3,TROPONINI:3 in the last 72 hours No components found with this basename: POCBNP:3 No results found for this basename: DDIMER:2 in the last 72 hours No results found for this basename: HGBA1C:2 in the last 72 hours No results found for this basename: CHOL:2,HDL:2,LDLCALC:2,TRIG:2,CHOLHDL:2,LDLDIRECT:2 in the last 72 hours No results found for  this basename: TSH,T4TOTAL,FREET3,T3FREE,THYROIDAB in the last 72 hours No results found for this basename: VITAMINB12:2,FOLATE:2,FERRITIN:2,TIBC:2,IRON:2,RETICCTPCT:2 in the last 72 hours  Micro Results: Recent Results (from the past 240 hour(s))  CULTURE, BLOOD (ROUTINE X 2)     Status: Normal   Collection Time   08/18/11  1:19 AM      Component Value Range Status Comment   Specimen Description BLOOD R FOREARM   Final    Special Requests     Final    Value: BOTTLES DRAWN AEROBIC AND ANAEROBIC 4CC AEROBIC/3CC ANAEROBIC   Culture  Setup Time [id number]   Final    Culture NO GROWTH 5 DAYS   Final    Report Status 08/24/2011 FINAL   Final   CULTURE, BLOOD (ROUTINE X 2)     Status: Normal   Collection Time   08/18/11  1:22 AM      Component Value Range Status Comment   Specimen Description BLOOD RIGHT ANTECUBITAL   Final    Special Requests BOTTLES DRAWN AEROBIC ONLY 5C   Final    Culture  Setup Time [id number]   Final    Culture NO GROWTH 5 DAYS   Final    Report Status 08/24/2011 FINAL   Final   MRSA PCR SCREENING  Status: Normal   Collection Time   08/18/11  2:27 AM      Component Value Range Status Comment   MRSA by PCR NEGATIVE  NEGATIVE  Final   CLOSTRIDIUM DIFFICILE BY PCR     Status: Normal   Collection Time   08/19/11  3:13 AM      Component Value Range Status Comment   C difficile by pcr NEGATIVE  NEGATIVE  Final   URINE CULTURE     Status: Normal   Collection Time   08/20/11  3:09 PM      Component Value Range Status Comment   Specimen Description URINE, CLEAN CATCH   Final    Special Requests NONE   Final    Culture  Setup Time [id number]   Final    Colony Count NO GROWTH   Final    Culture NO GROWTH   Final    Report Status 08/21/2011 FINAL   Final   STOOL CULTURE     Status: Normal   Collection Time   08/20/11 10:25 PM      Component Value Range Status Comment   Specimen Description STOOL   Final    Special Requests NONE   Final    Culture     Final     Value: ABUNDANT YEAST     NO SALMONELLA, SHIGELLA, CAMPYLOBACTER, OR YERSINIA ISOLATED     Note: REDUCED NORMAL FLORA PRESENT   Report Status 08/24/2011 FINAL   Final   CLOSTRIDIUM DIFFICILE BY PCR     Status: Normal   Collection Time   08/24/11  8:32 PM      Component Value Range Status Comment   C difficile by pcr NEGATIVE  NEGATIVE  Final     Studies/Results: No results found.  Medications: I have reviewed the patient's current medications. Scheduled Meds:    . amLODipine  10 mg Oral Daily  . amoxicillin-clavulanate  1 tablet Oral TID  . aspirin EC  81 mg Oral Daily  . calcitRIOL  0.25 mcg Oral Daily  . citalopram  20 mg Oral Daily  . enoxaparin (LOVENOX) injection  30 mg Subcutaneous Q24H  . feeding supplement  237 mL Oral TID WC  . Flora-Q  1 capsule Oral Daily  . potassium chloride  10 mEq Intravenous Q1 Hr x 4  . potassium chloride  40 mEq Oral BID  . potassium chloride  40 mEq Oral Once  . sodium chloride  3 mL Intravenous Q12H  . tiotropium  18 mcg Inhalation Daily   Continuous Infusions:   PRN Meds:.acetaminophen, acetaminophen, albuterol, ALPRAZolam, alum & mag hydroxide-simeth, ondansetron (ZOFRAN) IV, ondansetron, traMADol-acetaminophen  Assessment/Plan: Consultants:  Dr. Yancey Flemings, Gastroenterology, signed off. Dental surgery - Dr. Kristin Bruins Physical therapy Antibiotics:  Rocephin 08/18/11--->Ceftin 08/19/11.  Flagyl 08/18/11--->08/19/11  Zosyn 08/18/11--->08/18/11, Restarted 08/20/11----> 08/25/2011 Cipro 08/18/11--->08/18/11  Vancomycin 08/22/11--->08/25/2011 Augmentin 08/25/2011--->  Diarrhea / Sepsis / Hypotension / Leukocytosis / Metabolic acidosis, normal anion gap (NAG) Initially, had Index of suspicion initially high for C. difficile colitis given recent antibiotic use and high white blood cell count, but C. difficile PCR studies were negative. Stool cultures on 08/20/2011 was negative.  Metabolic acidosis likely from sepsis and bicarbonate losses in the  stool which resolved with bicarbonate replacement therapy.  Initially treated empirically with Flagyl until C. difficile studies came back negative and Flagyl was discontinued.  Was also on initially on empiric Rocephin for the possibility of other sources of infection including the lungs and the urinary  tract. Unfortunately, urine cultures not checked at the time of admission. Urine cultures sent on 08/20/11, but were negative, as expected.  CT scan of abdomen showed possible gallbladder inflammation as well as perinephric stranding. Abdominal ultrasound not consistent with cholecystitis so source of infection felt to be pyelonephritis.  Antibiotics broadened to Zosyn on 08/20/11 to cover pneumonia, pyelonephritis and cholecystitis. WBC still elevated, despite change.  Blood cultures negative to date.  GI consult for help with management: HIDA scan ordered - results with no significant findings; GI signed off 08/24/2011  Vancomycin added 08/22/11 to cover Staph.  MRI lumbar spine done on 08/22/2011 to rule out diskitis, given h/o back pain and lumbar laminectomy, which was negative.  Orthopantogram performed on 08/23/2011 showed dental abscess and dental surgery consulted. Probiotics added for/16/2013 per wife's request.  Suspect patient's sepsis may have been from dental abscess.  Suspect patient's diarrhea may have been from antibiotic use.  Dental Abscess  Appreciate input from Dr. Kristin Bruins, Orthopantogram performed on 08/23/2011 showed dental abscess. Patient has been scheduled for scheduled for surgery on 08/29/2011 for extraction of remaining teeth with alveoloplasty and pre-prosthetic surgery. Patient will need followup with a dentist of his choice for fabrication of upper and lower complete dentures as outpatient. Transition the patient from Zosyn and vancomycin to Augmentin.  Antibiotics since 08/18/2011.  Defined a total of 10 day course.  COPD / Dyspnea / Hypoxia  Oxygen saturations  maintained above 90%, continue nebulized bronchodilator therapy as needed.  Continue Spiriva. Increased chest congestion noted 08/20/11 felt to be secondary to pulmonary edema, pro-BNP was elevated. Responded to Lasix given 08/20/11. On chest x-ray patient per nursing had increased wheezing, chest x-ray on 08/27/2011 showed perihilar bronchiectatic changes, increased density in the right lower lobe with questionable developing infiltrate.  Suspect changes in the lung and shortness of breath likely due to volume overload as the patient is +8.5 L since admission.  Try one dose of IV lasix today and reassess.  ANEMIA OF CHRONIC DISEASE  Normocytic anemia, likely of chronic disease and due to chronic kidney disease.  Hemoglobin stable.  On Aranesp as an outpatient.  History of coronary artery disease/myocardial infarction/Elevated CK-MB  Troponins were negative x 3. Continue aspirin. 2-D echocardiogram on 08/18/2011 showed ejection fraction of 55-60%.   History of Atrial fibrillation / nonsustained ventricular tachycardia  Maintaining normal sinus rhythm on telemetry.  One episode of nonsustained ventricular tachycardia, likely exacerbated by hypokalemia. Repleting potassium. Patient not on anticoagulation.  PERIPHERAL VASCULAR DISEASE  Continue aspirin.  SECONDARY HYPERPARATHYROIDISM  Continue calcitriol.  Alzheimer's disease  Appears stable.  AKI (acute kidney injury) / CKD (chronic kidney disease) stage 3, GFR 30-59 ml/min  Acute renal failure likely secondary to sepsis. Creatinine back to baseline with gentle IVF. IVF stopped due to concerns for pulmonary edema, resumed 08/22/11.  Patient's saline lock on 08/25/2011.  Continue to hold hyzaar.  Abnormal LFTs  Likely secondary to sepsis, improving.  Transaminases and alkaline phosphatase still markedly abnormal.  Abdominal ultrasound to evaluate gallbladder/biliary tree performed on 08/19/2011. No significant evidence of cholecystitis.    Statin on hold consider resuming statin at discharge.  Elevated brain natriuretic peptide (BNP) level / chronic diastolic CHF  Chest x-ray clear with no evidence of pulmonary edema on admission. Suspect initial elevation of pro-BNP secondary to decreased renal clearance and chronic diastolic dysfunction.  Repeat two-dimensional echocardiogram showed normal LV function, grade I diastolic dysfunction.  Lasix 40 mg IV x1 given on 08/20/2011 for increased chest congestion.  Hyponatremia  Likely secondary to dehydration from GI losses, resolved with gentle IV fluids.  Hypokalemia  Secondary to GI losses, repleting orally. Continue replace as needed.  Left renal mass  Consistent with a complex cyst on ultrasound on 08/19/1998 team.  Will need further evaluation as outpatient, with followup imaging in 3 months.  Enlarged prostate  No complaints of urinary hesitancy.  Right bundle branch block  Chronic.  Thrombocytopenia Chronic.  Continue to monitor.  Prophylaxis Lovenox   Code Status: Full  Family Communication: Carliss, Quast [phone]; wife updated by telephone 08/26/11, attempted to update the wife by telephone on 08/27/2011. Disposition Plan: SNF, after dental surgery in 08/29/2011, plan for discharge on 08/30/2011.    LOS: 10 days  Ellysia Char A, MD 08/27/2011, 10:46 AM

## 2011-08-28 ENCOUNTER — Inpatient Hospital Stay (HOSPITAL_COMMUNITY): Payer: Medicare Other

## 2011-08-28 DIAGNOSIS — I4891 Unspecified atrial fibrillation: Secondary | ICD-10-CM

## 2011-08-28 DIAGNOSIS — J690 Pneumonitis due to inhalation of food and vomit: Secondary | ICD-10-CM

## 2011-08-28 DIAGNOSIS — J9600 Acute respiratory failure, unspecified whether with hypoxia or hypercapnia: Secondary | ICD-10-CM

## 2011-08-28 DIAGNOSIS — J449 Chronic obstructive pulmonary disease, unspecified: Secondary | ICD-10-CM

## 2011-08-28 LAB — BLOOD GAS, ARTERIAL
FIO2: 1 %
Patient temperature: 37
TCO2: 17.8 mmol/L (ref 0–100)
pH, Arterial: 7.43 (ref 7.350–7.450)

## 2011-08-28 LAB — BASIC METABOLIC PANEL
BUN: 23 mg/dL (ref 6–23)
CO2: 23 mEq/L (ref 19–32)
CO2: 21 mEq/L (ref 19–32)
Calcium: 8.9 mg/dL (ref 8.4–10.5)
Calcium: 8.9 mg/dL (ref 8.4–10.5)
Calcium: 8.5 mg/dL (ref 8.4–10.5)
Creatinine, Ser: 1.9 mg/dL — ABNORMAL HIGH (ref 0.50–1.35)
Creatinine, Ser: 1.99 mg/dL — ABNORMAL HIGH (ref 0.50–1.35)
GFR calc Af Amer: 32 mL/min — ABNORMAL LOW (ref >90)
GFR calc non Af Amer: 31 mL/min — ABNORMAL LOW (ref >90)
GFR calc non Af Amer: 28 mL/min — ABNORMAL LOW (ref >90)
GFR calc non Af Amer: 29 mL/min — ABNORMAL LOW (ref >90)
Glucose, Bld: 137 mg/dL — ABNORMAL HIGH (ref 70–99)
Glucose, Bld: 123 mg/dL — ABNORMAL HIGH (ref 70–99)
Potassium: 2.8 mEq/L — ABNORMAL LOW (ref 3.5–5.1)
Sodium: 140 mEq/L (ref 135–145)

## 2011-08-28 LAB — CBC
HCT: 34.6 % — ABNORMAL LOW (ref 39.0–52.0)
Hemoglobin: 10.7 g/dL — ABNORMAL LOW (ref 13.0–17.0)
MCH: 29.5 pg (ref 26.0–34.0)
MCHC: 30.9 g/dL (ref 30.0–36.0)
MCV: 95.3 fL (ref 78.0–100.0)
RBC: 3.63 MIL/uL — ABNORMAL LOW (ref 4.22–5.81)

## 2011-08-28 MED ORDER — HEPARIN SODIUM (PORCINE) 5000 UNIT/ML IJ SOLN
5000.0000 [IU] | Freq: Three times a day (TID) | INTRAMUSCULAR | Status: DC
  Administered 2011-08-28 – 2011-09-06 (×26): 5000 [IU] via SUBCUTANEOUS
  Filled 2011-08-28 (×29): qty 1

## 2011-08-28 MED ORDER — SODIUM CHLORIDE 0.9 % IV SOLN
INTRAVENOUS | Status: AC
  Administered 2011-08-28: 16:00:00 via INTRAVENOUS

## 2011-08-28 MED ORDER — DEXTROSE 5 % IV SOLN
2.0000 g | INTRAVENOUS | Status: DC
  Administered 2011-08-28 – 2011-08-30 (×3): 2 g via INTRAVENOUS
  Filled 2011-08-28 (×4): qty 2

## 2011-08-28 MED ORDER — VANCOMYCIN HCL 1000 MG IV SOLR
750.0000 mg | INTRAVENOUS | Status: DC
  Administered 2011-08-28 – 2011-08-31 (×4): 750 mg via INTRAVENOUS
  Filled 2011-08-28 (×7): qty 750

## 2011-08-28 MED ORDER — CLINDAMYCIN PHOSPHATE 600 MG/50ML IV SOLN: 600.0000 mg | Freq: Three times a day (TID) | INTRAVENOUS | Status: DC

## 2011-08-28 MED ORDER — POTASSIUM CHLORIDE CRYS ER 20 MEQ PO TBCR
40.0000 meq | EXTENDED_RELEASE_TABLET | Freq: Once | ORAL | Status: AC
  Administered 2011-08-28: 40 meq via ORAL
  Filled 2011-08-28: qty 2

## 2011-08-28 MED ORDER — BIOTENE DRY MOUTH MT LIQD
15.0000 mL | Freq: Two times a day (BID) | OROMUCOSAL | Status: DC
  Administered 2011-08-28 – 2011-09-05 (×13): 15 mL via OROMUCOSAL

## 2011-08-28 MED ORDER — POTASSIUM CHLORIDE 10 MEQ/100ML IV SOLN
10.0000 meq | INTRAVENOUS | Status: AC
  Administered 2011-08-28 (×6): 10 meq via INTRAVENOUS
  Filled 2011-08-28: qty 500
  Filled 2011-08-28: qty 100

## 2011-08-28 MED ORDER — PIPERACILLIN-TAZOBACTAM 3.375 G IVPB
3.3750 g | Freq: Three times a day (TID) | INTRAVENOUS | Status: DC
  Filled 2011-08-28 (×4): qty 50

## 2011-08-28 MED ORDER — VITAMINS A & D EX OINT
TOPICAL_OINTMENT | CUTANEOUS | Status: AC
  Filled 2011-08-28: qty 5

## 2011-08-28 MED ORDER — CLINDAMYCIN PHOSPHATE 600 MG/50ML IV SOLN
600.0000 mg | Freq: Three times a day (TID) | INTRAVENOUS | Status: DC
  Administered 2011-08-28 – 2011-09-02 (×15): 600 mg via INTRAVENOUS
  Filled 2011-08-28 (×17): qty 50

## 2011-08-28 MED ORDER — SODIUM CHLORIDE 0.9 % IV SOLN
INTRAVENOUS | Status: DC
  Administered 2011-08-28: 13:00:00 via INTRAVENOUS

## 2011-08-28 MED ORDER — POTASSIUM CHLORIDE 10 MEQ/100ML IV SOLN
10.0000 meq | INTRAVENOUS | Status: DC
  Filled 2011-08-28 (×4): qty 100

## 2011-08-28 MED ORDER — POTASSIUM CHLORIDE 10 MEQ/100ML IV SOLN
10.0000 meq | INTRAVENOUS | Status: AC
  Administered 2011-08-28 (×5): 10 meq via INTRAVENOUS
  Filled 2011-08-28 (×5): qty 100

## 2011-08-28 MED ORDER — DEXTROSE 5 % IV SOLN: 1.0000 g | Freq: Three times a day (TID) | INTRAVENOUS | Status: DC

## 2011-08-28 NOTE — Consult Note (Signed)
Name: Brandon [name] MRN: [id number] DOB: 17-Jun-1925    LOS: 11  PCCM ADMISSION NOTE  History of Present Illness: 78 yr old multiple co morbids, copd, presented initially with unclear etioloy sepsis. Now determined to be Dental abscess scheduled for surgery in am . Presents now with increased WOB, resp failure requiring 100% NRB and pcxr with concerns rt base infiltrate.. Lasix given for concern edema. . Abx re changed to aggressive nosomcial ABX. Called to assist with acute hypoxic resp failure.  Lines / Drains: piv  Cultures: [facility] 4/21 x 2>>> 4/17 cdiff neg 4/11 BC x 2>>>neg 4/13 stool>>> yeats no entrics 4/13- urine>>>neg 4/12 cdiff neg  Antibiotics: Augmentin>>>4/21 Zosyn 4/21>>>4/21 vanc 4/21>>> ceftaz 4/21>>> clinda 4/21>>>  Tests / Events: Panarex - Severe dental caries bilaterally. Possible peri apical abscess or<BR>apicitis involving visualized maxillary teeth.<BR></P></DIV> 4/21 hypoxic resp failure, 100% NRB  Past Medical History  Diagnosis Date  . COPD (chronic obstructive pulmonary disease)   . Osteoporosis   . GERD (gastroesophageal reflux disease)   . Hypertension   . Anemia   . Arthritis   . Depression   . Cerebrovascular disease, unspecified 02/2008    Carotid U/S- right internal carotid 60-79%, left internal carotid artery 40-59%  . Secondary hyperparathyroidism (of renal origin)   . Other and unspecified hyperlipidemia   . Peripheral vascular disease, unspecified   . Atrial fibrillation   . Unspecified disorder resulting from impaired renal function   . Hiatal hernia   . Hyperkalemia     Due to ACE Inhibitors  . Varicosities     LE  . CVA (cerebral infarction)   . Lumbago   . Lumbosacral spondylosis without myelopathy   . Chronic pain syndrome   . Diverticulosis 08/18/2011  . Enlarged prostate 08/18/2011  . CKD (chronic kidney disease) stage 3, GFR 30-59 ml/min 08/18/2011  . Left renal mass 08/18/2011  . Right bundle branch block 08/18/2011  .  Diastolic CHF, chronic 08/19/2011    Study Conclusions  - Left ventricle: The cavity size was normal. Wall thickness was normal. Systolic function was normal. The estimated ejection fraction was in the range of 55% to 60%. Images were inadequate for LV wall motion assessment. Doppler parameters are consistent with abnormal left ventricular relaxation (grade 1 diastolic dysfunction). - Aortic valve: Poorly visualized. - Mitral valve: Poorly visualized. - Left atrium: Poorly visualized. - Right ventricle: Poorly visualized. Probably normal size/systolic function. - Tricuspid valve: Poorly visualized. - Pulmonic valve: Poorly visualized. - Systemic veins: IVC not visualized. Impressions:  - Grossly normal LV systolic function. Otherwise, this echo is not technically adequate to accurately comment on other structures.      Past Surgical History  Procedure Date  . Lumbar fusion 01/2008   Prior to Admission medications   Medication Sig Start Date End Date Taking? Authorizing Provider  ALPRAZolam (XANAX) 0.25 MG tablet Take 0.25 mg by mouth daily as needed. Anxiety   Yes Historical Provider, MD  amLODipine (NORVASC) 5 MG tablet Take 1 tablet (5 mg total) by mouth daily. 08/03/11 08/02/12 Yes Romero Belling, MD  aspirin 81 MG tablet Take 81 mg by mouth daily.     Yes Historical Provider, MD  atorvastatin (LIPITOR) 80 MG tablet Take 1 tablet (80 mg total) by mouth daily. 08/03/11 08/02/12 Yes Romero Belling, MD  calcitRIOL (ROCALTROL) 0.25 MCG capsule TAKE 1 CAPSULE BY MOUTH EVERY DAY 08/03/11  Yes Romero Belling, MD  Cholecalciferol (VITAMIN D PO) Take 1 tablet by mouth daily.   Yes  Historical Provider, MD  citalopram (CELEXA) 20 MG tablet Take 1 tablet (20 mg total) by mouth daily. 08/03/11 08/02/12 Yes Romero Belling, MD  losartan-hydrochlorothiazide (HYZAAR) 100-25 MG per tablet Take 1 tablet by mouth daily. 08/03/11 08/02/12 Yes Romero Belling, MD  Multiple Vitamin (MULTIVITAMIN) tablet Take 1 tablet by mouth daily.      Yes Historical Provider, MD  omeprazole (PRILOSEC) 20 MG capsule TAKE 1 CAPSULE TWICE A DAY .. (TAKE 30 MINUTES BEFORE MEALS) 08/23/10  Yes Iva Boop, MD  tiotropium (SPIRIVA) 18 MCG inhalation capsule Place 1 capsule (18 mcg total) into inhaler and inhale daily. 05/24/11 05/23/12 Yes Romero Belling, MD   Allergies No Known Allergies  Family History Family History  Problem Relation Age of Onset  . Cancer Neg Hx     Colon Cancer  . Stroke Father   . Coronary artery disease Father   . Heart attack Father     Social History  reports that he quit smoking about 29 years ago. His smoking use included Cigarettes. He has a 38 pack-year smoking history. He does not have any smokeless tobacco history on file. He reports that he does not drink alcohol or use illicit drugs.  Review Of Systems  11 points review of systems is negative with an exception of listed in HPI.  Vital Signs: Temp:  [99.5 F (37.5 C)-102.5 F (39.2 C)] 102.5 F (39.2 C) (04/21 0548) Pulse Rate:  [103-115] 107  (04/21 0548) Resp:  [18-26] 24  (04/21 0548) BP: (119-149)/(64-72) 130/65 mmHg (04/21 0548) SpO2:  [87 %-93 %] 87 % (04/21 0548) FiO2 (%):  [2 %] 2 % (04/20 2155) I/O last 3 completed shifts: In: 123 [P.O.:120; I.V.:3] Out: -   Physical Examination: General:  No distress, speaking full sentences Neuro:  Nonfocal, alert HEENT:  Horrible dentition, no discrete tenderness or fullness Neck:  Nontender, no swelling Cardiovascular:  s1 s2 RRR Lungs:  Rt base crackels inspiration Abdomen:  Soft, BS wnl no r/g  Ventilator settings: Vent Mode:  [-]  FiO2 (%):  [2 %] 2 %  Labs and Imaging:  Reviewed.  Please refer to the Assessment and Plan section for relevant results.  Assessment and Plan: 1. Hypoxic Resp Failure -secondary to new rt base infiltrate / aspiration extremely likely vs HCAP -ABG, then titrarte o2 down if able -currently does not need intubation, speaks full sentences -abdo distention, see  #3 -would NOT offer NIMV if worsens , would go straight to intubation, would increase asp risk major -pcxr in am  -suction if needed  2. Dental Abscess / Sepsis -clinically not impressive as source at bedside -sounds like planned for OR in am  -change ABX -npo  3. New Rt base infiltrate / PNA -HCAP vs asp, favor asp -npo -will need SLP in future and likely objective testing when diet restarted -Clinical declines on zosyn, dc -add ceftaz, clinda to vanc -sputum -may need NGT to suction has some distention, KUB first -dc further lasix  4. Hypokalemia / Acute on Chronic CRI  -ensure replaced IV and rechecked in pm  -NO LASIX -hydration -dc Lovenox, use sub q hep  5. Leukocytosis -secondary to knew PNA, cbc in am  -new ABX   Best practices / Disposition: -->full code -->Heparin for DVT Px, dc lovenox with CRI -->Protonix for GI Px -->diet-npo --> i updated the pt  The patient is critically ill with multiple organ systems failure and requires high complexity decision making for assessment and support, frequent evaluation  and titration of therapies, application of advanced monitoring technologies and extensive interpretation of multiple databases. Critical Care Time devoted to patient care services described in this note is 45 minutes.  Nelda Bucks 08/28/2011, 9:29 AM  Mcarthur Rossetti. Tyson Alias, MD, FACP Pgr: [phone] [facility]

## 2011-08-28 NOTE — Progress Notes (Addendum)
ANTIBIOTIC CONSULT NOTE - INITIAL  Pharmacy Consult for Vancomycin/Zosyn --> switched to Ceftazidime/Clindamycin/Vancomycin Indication: Dental abscess/sepsis/PNA  No Known Allergies  Patient Measurements: Height: 5\' 10"  (177.8 cm) Weight: 147 lb 8 oz (66.906 kg) (standing scale) IBW/kg (Calculated) : 73   Vital Signs: Temp: 102.5 F (39.2 C) (04/21 0548) Temp src: Oral (04/21 0548) BP: 130/65 mmHg (04/21 0548) Pulse Rate: 107  (04/21 0548) Intake/Output from previous day: 04/20 0701 - 04/21 0700 In: 123 [P.O.:120; I.V.:3] Out: -  Intake/Output from this shift:    Labs:  Basename 08/28/11 0511 08/27/11 0513 08/26/11 0449  WBC 25.6* 13.3* 11.7*  HGB 10.7* 10.6* 9.9*  PLT 124* 122* 102*  LABCREA -- -- --  CREATININE 1.90* 1.39* 1.28   Estimated Creatinine Clearance: 27.9 ml/min (by C-G formula based on Cr of 1.9).  30 ml/min (normalized)  Basename 08/25/11 1602  VANCOTROUGH 12.0  VANCOPEAK --  VANCORANDOM --  GENTTROUGH --  GENTPEAK --  GENTRANDOM --  TOBRATROUGH --  TOBRAPEAK --  TOBRARND --  AMIKACINPEAK --  AMIKACINTROU --  AMIKACIN --     Microbiology: Recent Results (from the past 720 hour(s))  CULTURE, BLOOD (ROUTINE X 2)     Status: Normal   Collection Time   08/18/11  1:19 AM      Component Value Range Status Comment   Specimen Description BLOOD R FOREARM   Final    Special Requests     Final    Value: BOTTLES DRAWN AEROBIC AND ANAEROBIC 4CC AEROBIC/3CC ANAEROBIC   Culture  Setup Time [id number]   Final    Culture NO GROWTH 5 DAYS   Final    Report Status 08/24/2011 FINAL   Final   CULTURE, BLOOD (ROUTINE X 2)     Status: Normal   Collection Time   08/18/11  1:22 AM      Component Value Range Status Comment   Specimen Description BLOOD RIGHT ANTECUBITAL   Final    Special Requests BOTTLES DRAWN AEROBIC ONLY 5C   Final    Culture  Setup Time [id number]   Final    Culture NO GROWTH 5 DAYS   Final    Report Status 08/24/2011 FINAL   Final     MRSA PCR SCREENING     Status: Normal   Collection Time   08/18/11  2:27 AM      Component Value Range Status Comment   MRSA by PCR NEGATIVE  NEGATIVE  Final   CLOSTRIDIUM DIFFICILE BY PCR     Status: Normal   Collection Time   08/19/11  3:13 AM      Component Value Range Status Comment   C difficile by pcr NEGATIVE  NEGATIVE  Final   URINE CULTURE     Status: Normal   Collection Time   08/20/11  3:09 PM      Component Value Range Status Comment   Specimen Description URINE, CLEAN CATCH   Final    Special Requests NONE   Final    Culture  Setup Time [id number]   Final    Colony Count NO GROWTH   Final    Culture NO GROWTH   Final    Report Status 08/21/2011 FINAL   Final   STOOL CULTURE     Status: Normal   Collection Time   08/20/11 10:25 PM      Component Value Range Status Comment   Specimen Description STOOL   Final    Special Requests NONE  Final    Culture     Final    Value: ABUNDANT YEAST     NO SALMONELLA, SHIGELLA, CAMPYLOBACTER, OR YERSINIA ISOLATED     Note: REDUCED NORMAL FLORA PRESENT   Report Status 08/24/2011 FINAL   Final   CLOSTRIDIUM DIFFICILE BY PCR     Status: Normal   Collection Time   08/24/11  8:32 PM      Component Value Range Status Comment   C difficile by pcr NEGATIVE  NEGATIVE  Final     Medical History: Past Medical History  Diagnosis Date  . COPD (chronic obstructive pulmonary disease)   . Osteoporosis   . GERD (gastroesophageal reflux disease)   . Hypertension   . Anemia   . Arthritis   . Depression   . Cerebrovascular disease, unspecified 02/2008    Carotid U/S- right internal carotid 60-79%, left internal carotid artery 40-59%  . Secondary hyperparathyroidism (of renal origin)   . Other and unspecified hyperlipidemia   . Peripheral vascular disease, unspecified   . Atrial fibrillation   . Unspecified disorder resulting from impaired renal function   . Hiatal hernia   . Hyperkalemia     Due to ACE Inhibitors  . Varicosities      LE  . CVA (cerebral infarction)   . Lumbago   . Lumbosacral spondylosis without myelopathy   . Chronic pain syndrome   . Diverticulosis 08/18/2011  . Enlarged prostate 08/18/2011  . CKD (chronic kidney disease) stage 3, GFR 30-59 ml/min 08/18/2011  . Left renal mass 08/18/2011  . Right bundle branch block 08/18/2011  . Diastolic CHF, chronic 08/19/2011    Study Conclusions  - Left ventricle: The cavity size was normal. Wall thickness was normal. Systolic function was normal. The estimated ejection fraction was in the range of 55% to 60%. Images were inadequate for LV wall motion assessment. Doppler parameters are consistent with abnormal left ventricular relaxation (grade 1 diastolic dysfunction). - Aortic valve: Poorly visualized. - Mitral valve: Poorly visualized. - Left atrium: Poorly visualized. - Right ventricle: Poorly visualized. Probably normal size/systolic function. - Tricuspid valve: Poorly visualized. - Pulmonic valve: Poorly visualized. - Systemic veins: IVC not visualized. Impressions:  - Grossly normal LV systolic function. Otherwise, this echo is not technically adequate to accurately comment on other structures.       Medications:  Scheduled:    . amLODipine  10 mg Oral Daily  . aspirin EC  81 mg Oral Daily  . calcitRIOL  0.25 mcg Oral Daily  . citalopram  20 mg Oral Daily  . enoxaparin (LOVENOX) injection  30 mg Subcutaneous Q24H  . feeding supplement  237 mL Oral TID WC  . Flora-Q  1 capsule Oral Daily  . furosemide  20 mg Intravenous Once  . potassium chloride  10 mEq Intravenous Q1 Hr x 5  . potassium chloride  40 mEq Oral BID  . potassium chloride  40 mEq Oral Once  . potassium chloride  40 mEq Oral Once  . sodium chloride  3 mL Intravenous Q12H  . tiotropium  18 mcg Inhalation Daily  . DISCONTD: amoxicillin-clavulanate  1 tablet Oral TID  . DISCONTD: potassium chloride  10 mEq Intravenous Q1 Hr x 4    Antibiotic History: 4/11 >> Rocephin/Ceftin >> 4/13 4/11  >> Flagyl >> 4/13 4/13 >> Zosyn >> 4/18 4/15 >> Vanc >>4/18 4/18 >>Augmentin >> 4/21 4/21>>Zosyn >> 4/21>>Vanc>>  Assessment:  85 YOM on antibiotics since 4/11 for sepsis of unknown etiology,  possibly due to dental abscess - scheduled for teeth extraction 08/29/11  Temp spike & worsening leukocytosis today, resume broad spectrum antibiotics for possible new PNA.  Vancomycin trough subtherapeutic 4/18 on 1gm q24h, however, Scr has worsened so will start with lower dose  Goal of Therapy:  Vancomycin trough level 15-20 mcg/ml  Plan:   Vancomycin 750mg  IV q24h  Check trough at steady state Zosyn 3.375gm IV q8h (4hr extended infusions) Follow up renal function & cultures  Loralee Pacas, PharmD, BCPS Pager: [phone] 08/28/2011,8:41 AM  ADDENDUM: 08/28/2011 10:22 AM A: After CCM saw patient, abx have been adjusted to vancomycin, ceftazidime, and clindamycin for aspiration pneumonia (as well as cover for dental abscess) since patient clinically declined on zosyn.  CrCl~27 ml/min. P: Continue vancomycin at current dosing.  Adjust Ceftazidime to 2g IV q24h.  No adjustments to clindamycin necessary for renal insufficiency - continue clindamycin 600 mg IV q8h.  Clance Boll, PharmD, BCPS Pager: [phone] 08/28/2011 10:24 AM

## 2011-08-28 NOTE — Progress Notes (Signed)
Critical value of K 2.5 reported to on call [facility]. New orders for runs of K IV given. Pt continues to have labored breathing with productive cough. RT called and neb tx given with little relief. O2 on via nasal cannula O2 sats 96%.

## 2011-08-28 NOTE — Progress Notes (Signed)
Patient AV:[id number] Brandon [name]      DOB: 1925-09-21      BJY:[id number]  Consult received for Palliative Medicine conference.  I spoke with the patient's wife who is a little distracted at the moment.  She stated she would be at the hospital around 1030 -11 with her son and that she wanted to talk with her son and see her husband before committing to a time to meet.  She gave me permission to stop over later this morning to arrange a time and check on them.   [name] L. Ladona Ridgel, MD MBA The Palliative Medicine Team at [facility] Phone: [phone] Pager: [phone]

## 2011-08-28 NOTE — Consult Note (Signed)
Consult Note from the Palliative Medicine Team at [facility] Patient Brandon [name]      DOB: [birth year]      BJY:[id number]   Consult Requested by: Dr. Betti Cruz     PCP: Romero Belling, MD, MD Reason for Consultation:GOC , sx rec     Phone Number:[phone]  Assessment and Plan: 78 year old white male admitted with diarrhea and shortness of breath, and sepsis.  The patient has an underlying COPD, and AFib, as well as CKD.  He was found to have a dental abcess and was going to the OR in the am . Today the patient developed fever and increased work of breathing.  He was moved to the ICU and spoke with Dr. Betti Cruz he requested that his code status be changed to DNR with continued attention to treat the treatable. 1. Code Status: DNR 1. Dental infection:  Continue to treat with antibiotics 2. Respiratory distress improved since this am.  Continue to monitor and treat in the ICU.  Agree with lasix 3. Dementia : stable,  No evidence for delerium 2. Psycho/Social: Very supportive wife and 5 children with extended grandchildren.  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 Spiritual:Episcopalian 4. Disposition: to be determined  Patient Documents Completed or Given: Document Given Completed  Advanced Directives Pkt    MOST    DNR    Gone from My Sight    Hard Choices      Brief HPI: 78 year old white male with infection likely involved in the dental abscess causing sepsis, and worsening respiratory distress   ROS:   Limited. Patient states feels better,  States breathing better, no pain    PMH:  Past Medical History  Diagnosis Date  . COPD (chronic obstructive pulmonary disease)   . Osteoporosis   . GERD (gastroesophageal reflux disease)   . Hypertension   . Anemia   . Arthritis   . Depression   . Cerebrovascular disease, unspecified 02/2008    Carotid U/S- right internal carotid 60-79%, left internal carotid artery 40-59%  . Secondary hyperparathyroidism (of renal  origin)   . Other and unspecified hyperlipidemia   . Peripheral vascular disease, unspecified   . Atrial fibrillation   . Unspecified disorder resulting from impaired renal function   . Hiatal hernia   . Hyperkalemia     Due to ACE Inhibitors  . Varicosities     LE  . CVA (cerebral infarction)   . Lumbago   . Lumbosacral spondylosis without myelopathy   . Chronic pain syndrome   . Diverticulosis 08/18/2011  . Enlarged prostate 08/18/2011  . CKD (chronic kidney disease) stage 3, GFR 30-59 ml/min 08/18/2011  . Left renal mass 08/18/2011  . Right bundle branch block 08/18/2011  . Diastolic CHF, chronic 08/19/2011    Study Conclusions  - Left ventricle: The cavity size was normal. Wall thickness was normal. Systolic function was normal. The estimated ejection fraction was in the range of 55% to 60%. Images were inadequate for LV wall motion assessment. Doppler parameters are consistent with abnormal left ventricular relaxation (grade 1 diastolic dysfunction). - Aortic valve: Poorly visualized. - Mitral valve: Poorly visualized. - Left atrium: Poorly visualized. - Right ventricle: Poorly visualized. Probably normal size/systolic function. - Tricuspid valve: Poorly visualized. - Pulmonic valve: Poorly visualized. - Systemic veins: IVC not visualized. Impressions:  - Grossly normal LV systolic function. Otherwise, this echo is not technically adequate to accurately comment on other structures.  PSH: Past Surgical History  Procedure Date  . Lumbar fusion 01/2008   I have reviewed the FH and SH and  If appropriate update it with new information. No Known Allergies Scheduled Meds:   . amLODipine  10 mg Oral Daily  . aspirin EC  81 mg Oral Daily  . calcitRIOL  0.25 mcg Oral Daily  . cefTAZidime (FORTAZ)  IV  2 g Intravenous Q24H  . citalopram  20 mg Oral Daily  . clindamycin (CLEOCIN) IV  600 mg Intravenous Q8H  . Flora-Q  1 capsule Oral Daily  . heparin subcutaneous  5,000 Units  Subcutaneous Q8H  . potassium chloride  10 mEq Intravenous Q1 Hr x 5  . potassium chloride  40 mEq Oral BID  . potassium chloride  40 mEq Oral Once  . sodium chloride  3 mL Intravenous Q12H  . tiotropium  18 mcg Inhalation Daily  . vancomycin  750 mg Intravenous Q24H  . vitamin A & D      . DISCONTD: amoxicillin-clavulanate  1 tablet Oral TID  . DISCONTD: cefTAZidime (FORTAZ)  IV  1 g Intravenous Q8H  . DISCONTD: clindamycin (CLEOCIN) IV  600 mg Intravenous Q8H  . DISCONTD: enoxaparin (LOVENOX) injection  30 mg Subcutaneous Q24H  . DISCONTD: feeding supplement  237 mL Oral TID WC  . DISCONTD: piperacillin-tazobactam (ZOSYN)  IV  3.375 g Intravenous Q8H  . DISCONTD: potassium chloride  10 mEq Intravenous Q1 Hr x 4   Continuous Infusions:   . sodium chloride 75 mL/hr at 08/28/11 1615  . DISCONTD: sodium chloride 30 mL/hr at 08/28/11 1327   PRN Meds:.acetaminophen, acetaminophen, albuterol, ALPRAZolam, alum & mag hydroxide-simeth, ondansetron (ZOFRAN) IV, ondansetron, traMADol-acetaminophen    BP 118/56  Pulse 78  Temp(Src) 99.1 F (37.3 C) (Oral)  Resp 25  Ht 5\' 9"  (1.753 m)  Wt 68.4 kg (150 lb 12.7 oz)  BMI 22.27 kg/m2  SpO2 99%   PPS: 40 %   Intake/Output Summary (Last 24 hours) at 08/28/11 1707 Last data filed at 08/28/11 1600  Gross per 24 hour  Intake  922.5 ml  Output    310 ml  Net  612.5 ml   LBM:08/28/11                         Physical Exam:  General: No acute distress, NRB remains in place, but no use of accessory muscles HEENT:  PERRL, eOMI, anciteric Chest:  Decreased with some crackles at the bases CVS: regular, rate and rhythm Abdomen:soft, not tender Ext: warm, 2 + pulses Neuro:awake , alert oriented to self and family  Labs: CBC    Component Value Date/Time   WBC 25.6* 08/28/2011 0511   WBC 9.7 07/18/2011 1048   RBC 3.63* 08/28/2011 0511   RBC 3.21* 07/18/2011 1048   HGB 10.7* 08/28/2011 0511   HGB 10.0* 07/18/2011 1048   HCT 34.6* 08/28/2011  0511   HCT 30.0* 07/18/2011 1048   PLT 124* 08/28/2011 0511   PLT 88* 07/18/2011 1048   MCV 95.3 08/28/2011 0511   MCV 93.5 07/18/2011 1048   MCH 29.5 08/28/2011 0511   MCH 31.2 07/18/2011 1048   MCHC 30.9 08/28/2011 0511   MCHC 33.3 07/18/2011 1048   RDW 14.5 08/28/2011 0511   RDW 12.6 07/18/2011 1048   LYMPHSABS 2.4 08/23/2011 0500   LYMPHSABS 2.4 07/18/2011 1048   MONOABS 2.7* 08/23/2011 0500   MONOABS 1.8* 07/18/2011 1048   EOSABS 1.7* 08/23/2011  0500   EOSABS 0.7* 07/18/2011 1048   BASOSABS 0.0 08/23/2011 0500   BASOSABS 0.0 07/18/2011 1048     CMP     Component Value Date/Time   NA 140 08/28/2011 1150   K 2.8* 08/28/2011 1150   CL 108 08/28/2011 1150   CO2 21 08/28/2011 1150   GLUCOSE 123* 08/28/2011 1150   [name] 27* 08/28/2011 1150   CREATININE 2.07* 08/28/2011 1150   CALCIUM 8.9 08/28/2011 1150   CALCIUM 9.2 07/18/2011 0947   PROT 5.6* 08/24/2011 0459   ALBUMIN 2.5* 08/24/2011 0459   AST 26 08/24/2011 0459   ALT 67* 08/24/2011 0459   ALKPHOS 275* 08/24/2011 0459   BILITOT 0.5 08/24/2011 0459   GFRNONAA 28* 08/28/2011 1150   GFRAA 32* 08/28/2011 1150    Chest Xray Reviewed/Impressions: worsening right lung pneumonia ABdomen xray : diffuse small bowel distention.    Time In Time Out Total Time Spent with Patient Total Overall Time  430 pm  505 pm 15 min 35 min    Greater than 50%  of this time was spent counseling and coordinating care related to the above assessment and plan.     Rileigh Kawashima L. Ladona Ridgel, MD MBA The Palliative Medicine Team at [facility] Phone: [phone] Pager: [phone]

## 2011-08-28 NOTE — Progress Notes (Signed)
CRITICAL VALUE ALERT  Critical value received:  K 2.6  Date of notification: 08/28/11  Time of notification:  1835  Critical value read back: yes  Nurse who received alert:Jonothan Heberle Price  MD notified (1st page):  Dr. Betti Cruz  Time of first page: 1840  MD notified (2nd page):  Time of second page:  Responding MD:  Dr. Betti Cruz  Time MD responded:  [phone]

## 2011-08-28 NOTE — Progress Notes (Addendum)
Subjective: Patient has had increased work of breathing. Had a fever this AM. Able to talk in complete sentences.  Objective: Vital signs in last 24 hours: Filed Vitals:   08/27/11 1415 08/27/11 2155 08/28/11 0539 08/28/11 0548  BP: 149/64 119/72  130/65  Pulse: 103 115  107  Temp: 99.5 F (37.5 C) 100.8 F (38.2 C)  102.5 F (39.2 C)  TempSrc: Oral Oral  Oral  Resp: 18 26  24   Height:      Weight:      SpO2: 91% 90% 91% 87%   Weight change:   Intake/Output Summary (Last 24 hours) at 08/28/11 0854 Last data filed at 08/27/11 1500  Gross per 24 hour  Intake    123 ml  Output      0 ml  Net    123 ml    Physical Exam: General: Awake, Oriented, No acute distress. HEENT: EOMI, nasal cannula in place. Neck: Supple CV: S1 and S2 Lungs: Coarse breath sounds bilaterally Abdomen: Soft, Nontender, slightly distended, +bowel sounds. Ext: Good pulses. Trace edema.  Lab Results:  Basename 08/28/11 0511 08/27/11 0513 08/26/11 0449  NA 142 141 --  K 2.4* 3.3* --  CL 108 109 --  CO2 23 21 --  GLUCOSE 137* 110* --  BUN 23 15 --  CREATININE 1.90* 1.39* --  CALCIUM 8.9 9.1 --  MG 1.9 -- 1.5  PHOS -- -- --   No results found for this basename: AST:2,ALT:2,ALKPHOS:2,BILITOT:2,PROT:2,ALBUMIN:2 in the last 72 hours No results found for this basename: LIPASE:2,AMYLASE:2 in the last 72 hours  Basename 08/28/11 0511 08/27/11 0513  WBC 25.6* 13.3*  NEUTROABS -- --  HGB 10.7* 10.6*  HCT 34.6* 34.1*  MCV 95.3 94.7  PLT 124* 122*   No results found for this basename: CKTOTAL:3,CKMB:3,CKMBINDEX:3,TROPONINI:3 in the last 72 hours No components found with this basename: POCBNP:3 No results found for this basename: DDIMER:2 in the last 72 hours No results found for this basename: HGBA1C:2 in the last 72 hours No results found for this basename: CHOL:2,HDL:2,LDLCALC:2,TRIG:2,CHOLHDL:2,LDLDIRECT:2 in the last 72 hours No results found for this basename:  TSH,T4TOTAL,FREET3,T3FREE,THYROIDAB in the last 72 hours No results found for this basename: VITAMINB12:2,FOLATE:2,FERRITIN:2,TIBC:2,IRON:2,RETICCTPCT:2 in the last 72 hours  Micro Results: Recent Results (from the past 240 hour(s))  CLOSTRIDIUM DIFFICILE BY PCR     Status: Normal   Collection Time   08/19/11  3:13 AM      Component Value Range Status Comment   C difficile by pcr NEGATIVE  NEGATIVE  Final   URINE CULTURE     Status: Normal   Collection Time   08/20/11  3:09 PM      Component Value Range Status Comment   Specimen Description URINE, CLEAN CATCH   Final    Special Requests NONE   Final    Culture  Setup Time [id number]   Final    Colony Count NO GROWTH   Final    Culture NO GROWTH   Final    Report Status 08/21/2011 FINAL   Final   STOOL CULTURE     Status: Normal   Collection Time   08/20/11 10:25 PM      Component Value Range Status Comment   Specimen Description STOOL   Final    Special Requests NONE   Final    Culture     Final    Value: ABUNDANT YEAST     NO SALMONELLA, SHIGELLA, CAMPYLOBACTER, OR YERSINIA ISOLATED  Note: REDUCED NORMAL FLORA PRESENT   Report Status 08/24/2011 FINAL   Final   CLOSTRIDIUM DIFFICILE BY PCR     Status: Normal   Collection Time   08/24/11  8:32 PM      Component Value Range Status Comment   C difficile by pcr NEGATIVE  NEGATIVE  Final     Studies/Results: Dg Abd 1 View  08/28/2011  *RADIOLOGY REPORT*  Clinical Data: Evaluate for ileus.  Abdominal pain.  ABDOMEN - 1 VIEW  Comparison: 08/22/2011  Findings: Single supine view of the abdomen and pelvis.  Left lower lumbar spine fixation.  Dense vascular calcifications.  Gas filled large and small bowel loops.  The cecum is similarly moderately distended, at up to 11.9 cm.  The transverse colon is also moderately distended, measuring 8.9 cm.  Decreased from 9.8 cm on the prior.  No pneumatosis.  Limited evaluation for free intraperitoneal air, given technique.  Distal gas  identified.  IMPRESSION:  1.  Similar to minimal improvement in diffuse large and small bowel dilatation.  Favor adynamic ileus.  Distal obstruction could look similar. 2.  No pneumatosis.  Limited evaluation for free intraperitoneal air, secondary to technique.  Original Report Authenticated By: Consuello Bossier, M.D.   Dg Chest Port 1 View  08/28/2011  *RADIOLOGY REPORT*  Clinical Data: Evaluate for worsening pneumonia  PORTABLE CHEST - 1 VIEW  Comparison: 08/27/2011; portion of 02/2012; 05/11/2011  Findings: Grossly unchanged cardiac silhouette and mediastinal contours with atherosclerotic calcifications within a mildly ectatic thoracic aorta.  Interval increase in heterogeneous air space opacities within the right lower lung.  The left hemithorax is unchanged.  No definite pleural effusion or pneumothorax. Unchanged bones.  IMPRESSION: Findings compatible with worsening right lower lung pneumonia. A follow-up chest radiograph in 4 to 6 weeks after treatment is recommended to ensure resolution.  Original Report Authenticated By: Waynard Reeds, M.D.   Dg Chest Port 1 View  08/27/2011  *RADIOLOGY REPORT*  Clinical Data: Wheezing and fever.  PORTABLE CHEST - 1 VIEW  Comparison: Chest x-ray 08/17/2011, CT abdomen pelvis 08/18/2011  Findings: There is perihilar peribronchial thickening.  More focal density at the right lung base may indicate early infiltrate. Heart size is normal.  No edema.  Beneath the left hemidiaphragm, there are mildly distended, gas filled bowel loops. Additionally, there is a crescentic lucency, favored to represent additional bowel loops rather than free intraperitoneal air based on review of previous CT exams and chest x-rays.  IMPRESSION:  1.  Perihilar bronchitic changes. 2.  Increased density the right lower lobe, consistent with developing infiltrate.  Original Report Authenticated By: Patterson Hammersmith, M.D.    Medications: I have reviewed the patient's current  medications. Scheduled Meds:    . amLODipine  10 mg Oral Daily  . aspirin EC  81 mg Oral Daily  . calcitRIOL  0.25 mcg Oral Daily  . citalopram  20 mg Oral Daily  . enoxaparin (LOVENOX) injection  30 mg Subcutaneous Q24H  . feeding supplement  237 mL Oral TID WC  . Flora-Q  1 capsule Oral Daily  . furosemide  20 mg Intravenous Once  . piperacillin-tazobactam (ZOSYN)  IV  3.375 g Intravenous Q8H  . potassium chloride  10 mEq Intravenous Q1 Hr x 5  . potassium chloride  40 mEq Oral BID  . potassium chloride  40 mEq Oral Once  . potassium chloride  40 mEq Oral Once  . sodium chloride  3 mL Intravenous Q12H  .  tiotropium  18 mcg Inhalation Daily  . vancomycin  750 mg Intravenous Q24H  . DISCONTD: amoxicillin-clavulanate  1 tablet Oral TID  . DISCONTD: potassium chloride  10 mEq Intravenous Q1 Hr x 4   Continuous Infusions:   PRN Meds:.acetaminophen, acetaminophen, albuterol, ALPRAZolam, alum & mag hydroxide-simeth, ondansetron (ZOFRAN) IV, ondansetron, traMADol-acetaminophen  Assessment/Plan: Consultants:  Dr. Yancey Flemings, Gastroenterology, signed off. Dental surgery - Dr. Kristin Bruins Physical therapy Antibiotics:  Rocephin 08/18/11--->Ceftin 08/19/11.  Flagyl 08/18/11--->08/19/11  Zosyn 08/18/11--->08/18/11, Restarted 08/20/11----> 08/25/2011 Cipro 08/18/11--->08/18/11  Vancomycin 08/22/11--->08/25/2011, 08/28/2011 ---> Augmentin 08/25/2011--->08/28/2011 Ceftazidime 08/28/2011---> Clindamycin 08/28/2011--->  Acute Respiratory Failure / Presumed healthcare associated pneumonia versus aspiration pneumonia Portable chest x-ray on April 20 and 21st of 2013 showed findings compatible with worsening right lower lobe pneumonia. Patient transition back to step down.  Appreciate pulmonary critical care input. Antibiotics changed to clindamycin, Ceftazidime, and vancomycin. Palliative care consultation requested after discussion with patient's wife given patient's multiple comorbidities and given patient  has been made very little progress during the course of hospital stay.  Abdominal distention / Questionable adynamic ileus on abdominal x-ray Etiology unclear.  Patient had abdominal x-ray on 08/28/2011 that showed similar to minimal improvement in diffuse large and small bowel dilatation, favor adynamic ileus. Patient having good bowel movements without complaining of abdominal pain. Currently n.p.o. for pulmonary reasons.    AKI (acute kidney injury) / CKD (chronic kidney disease) stage 3, GFR 30-59 ml/min  Acute renal failure likely secondary to sepsis.  Renal function worsened on 08/28/2011 likely due to diuresis from Lasix.  Continue to monitor. Initially IVF stopped due to concerns for pulmonary edema, resumed 08/22/11.  Patient's saline lock on 08/25/2011.  Continue to hold hyzaar. Restart patient on gentle IV fluids.  Diarrhea / Sepsis / Hypotension / Leukocytosis / Metabolic acidosis, normal anion gap (NAG) Initially, had Index of suspicion initially high for C. difficile colitis given recent antibiotic use and high white blood cell count, but C. difficile PCR studies were negative. Stool cultures on 08/20/2011 was negative.  Metabolic acidosis likely from sepsis and bicarbonate losses in the stool which resolved with bicarbonate replacement therapy.  Initially treated empirically with Flagyl until C. difficile studies came back negative and Flagyl was discontinued.  Was also on initially on empiric Rocephin for the possibility of other sources of infection including the lungs and the urinary tract. Unfortunately, urine cultures not checked at the time of admission. Urine cultures sent on 08/20/11, but were negative, as expected.  CT scan of abdomen showed possible gallbladder inflammation as well as perinephric stranding. Abdominal ultrasound not consistent with cholecystitis so source of infection felt to be pyelonephritis.  Antibiotics broadened to Zosyn on 08/20/11 to cover pneumonia,  pyelonephritis and cholecystitis, which since then has been discontinued. Blood cultures negative to date.  GI consult for help with management: HIDA scan ordered - results with no significant findings; GI signed off 08/24/2011  Vancomycin added 08/22/11 to cover Staph.  MRI lumbar spine done on 08/22/2011 to rule out diskitis, given h/o back pain and lumbar laminectomy, which was negative.  Orthopantogram performed on 08/23/2011 showed dental abscess and dental surgery consulted. Probiotics added for/16/2013 per wife's request.  Suspect patient's sepsis may have been from dental abscess.  Suspect patient's diarrhea may have been from antibiotic use.  Dental Abscess  Appreciate input from Dr. Kristin Bruins, Orthopantogram performed on 08/23/2011 showed dental abscess. Patient has been scheduled for scheduled for surgery on 08/29/2011 for extraction of remaining teeth with alveoloplasty and pre-prosthetic surgery.  However the surgery may need to be rescheduled given patient's declining respiratory status the Patient will need followup with a dentist of his choice for fabrication of upper and lower complete dentures as outpatient. Initially transition the patient from Zosyn and vancomycin to Augmentin, however given the decline transition antibiotics to vancomycin, clindamycin and Fortaz.  Antibiotics since 08/18/2011.  COPD / Dyspnea / Hypoxia  Oxygen saturations maintained above 90%, continue nebulized bronchodilator therapy as needed.  Continue Spiriva. Increased chest congestion noted 08/20/11 felt to be secondary to pulmonary edema, pro-BNP was elevated. Responded to Lasix given 08/20/11. On chest x-ray patient per nursing had increased wheezing, chest x-ray on 08/27/2011 showed perihilar bronchiectatic changes, increased density in the right lower lobe with questionable developing infiltrate.  Patient was redosed with Lasix on 08/27/2011.    ANEMIA OF CHRONIC DISEASE  Normocytic anemia, likely of  chronic disease and due to chronic kidney disease.  Hemoglobin stable.  On Aranesp as an outpatient.  History of coronary artery disease/myocardial infarction/Elevated CK-MB  Troponins were negative x 3. Continue aspirin. 2-D echocardiogram on 08/18/2011 showed ejection fraction of 55-60%.   History of Atrial fibrillation / nonsustained ventricular tachycardia  Maintaining normal sinus rhythm on telemetry.  One episode of nonsustained ventricular tachycardia, likely exacerbated by hypokalemia. Repleting potassium. Patient not on anticoagulation.  PERIPHERAL VASCULAR DISEASE  Continue aspirin.  SECONDARY HYPERPARATHYROIDISM  Continue calcitriol.  Alzheimer's disease  Appears stable.  Abnormal LFTs  Likely secondary to sepsis, improving.  Transaminases and alkaline phosphatase still markedly abnormal.  Abdominal ultrasound to evaluate gallbladder/biliary tree performed on 08/19/2011. No significant evidence of cholecystitis.  Statin on hold consider resuming statin at discharge.  Elevated brain natriuretic peptide (BNP) level / chronic diastolic CHF  Chest x-ray clear with no evidence of pulmonary edema on admission. Suspect initial elevation of pro-BNP secondary to decreased renal clearance and chronic diastolic dysfunction.  Repeat two-dimensional echocardiogram showed normal LV function, grade I diastolic dysfunction.  Lasix 40 mg IV x1 given on 08/20/2011 for increased chest congestion and was given another dose of IV Lasix on 08/27/2011.   Hyponatremia  Likely secondary to dehydration from GI losses, resolved with gentle IV fluids.  Hypokalemia  Secondary to GI losses, repleting orally. Continue replace as needed.  Left renal mass  Consistent with a complex cyst on ultrasound on 08/19/1998 team.  Will need further evaluation as outpatient, with followup imaging in 3 months.  Enlarged prostate  No complaints of urinary hesitancy.  Right bundle branch block   Chronic.  Thrombocytopenia Chronic.  Continue to monitor.  Prophylaxis Lovenox   Code Status: Full  Family Communication: [place], Wins[place] [phone]; wife updated by telephone 08/28/11, and notified of patient's change. Disposition Plan: Patient transitioned to step down for closer monitoring.  Hold surgery which was scheduled for tomorrow.  Palliative care meeting with patient and family in reassessing goals.   LOS: 11 days  Paeton Studer A, MD 08/28/2011, 8:54 AM   Addendum: Had a discussion with patient, wife, son and daughter-in-law regarding patient's CODE STATUS.  Patient indicated that he does not wish to be resuscitated or have chest compressions done.  He also does not wish to be placed on mechanical ventilation even temporarily.  He and family agreed that he wants continued medical management.  He and family agreed that if he declines then the focus will be to keep him comfortable and not be aggressive with medical care.  Critical care time spent with patient and family and coordinating care was 79  minutes.  Kassem Kibbe A, MD 08/28/2011, 2:15 PM

## 2011-08-29 ENCOUNTER — Encounter (HOSPITAL_COMMUNITY)
Admission: EM | Disposition: A | Discharge status: Skilled Nursing Facility | Payer: Self-pay | Source: Home / Self Care | Attending: Internal Medicine

## 2011-08-29 ENCOUNTER — Inpatient Hospital Stay (HOSPITAL_COMMUNITY): Payer: Medicare Other

## 2011-08-29 ENCOUNTER — Encounter (HOSPITAL_COMMUNITY): Payer: Self-pay | Admitting: Internal Medicine

## 2011-08-29 LAB — CBC
HCT: 31.4 % — ABNORMAL LOW (ref 39.0–52.0)
Hemoglobin: 9.9 g/dL — ABNORMAL LOW (ref 13.0–17.0)
MCHC: 31.5 g/dL (ref 30.0–36.0)
WBC: 13.7 10*3/uL — ABNORMAL HIGH (ref 4.0–10.5)

## 2011-08-29 LAB — DIFFERENTIAL
Basophils Relative: 0 % (ref 0–1)
Eosinophils Absolute: 0.1 10*3/uL (ref 0.0–0.7)
Monocytes Absolute: 2.6 10*3/uL — ABNORMAL HIGH (ref 0.1–1.0)
Neutro Abs: 9.6 10*3/uL — ABNORMAL HIGH (ref 1.7–7.7)
Neutrophils Relative %: 70 % (ref 43–77)

## 2011-08-29 LAB — BASIC METABOLIC PANEL
BUN: 28 mg/dL — ABNORMAL HIGH (ref 6–23)
CO2: 18 mEq/L — ABNORMAL LOW (ref 19–32)
Chloride: 109 mEq/L (ref 96–112)
Glucose, Bld: 87 mg/dL (ref 70–99)
Potassium: 2.9 mEq/L — ABNORMAL LOW (ref 3.5–5.1)

## 2011-08-29 SURGERY — MULTIPLE EXTRACTION WITH ALVEOLOPLASTY
Anesthesia: General | Site: Mouth

## 2011-08-29 MED ORDER — POTASSIUM CHLORIDE 10 MEQ/100ML IV SOLN
10.0000 meq | INTRAVENOUS | Status: AC
  Administered 2011-08-29 (×5): 10 meq via INTRAVENOUS

## 2011-08-29 MED ORDER — SODIUM CHLORIDE 0.9 % IV SOLN
INTRAVENOUS | Status: AC
  Administered 2011-08-29: 16:00:00 via INTRAVENOUS

## 2011-08-29 MED ORDER — POTASSIUM CHLORIDE 10 MEQ/100ML IV SOLN
INTRAVENOUS | Status: AC
  Administered 2011-08-29: 10 meq
  Filled 2011-08-29: qty 600

## 2011-08-29 MED ORDER — POTASSIUM CHLORIDE 10 MEQ/50ML IV SOLN
10.0000 meq | INTRAVENOUS | Status: DC
  Filled 2011-08-29: qty 300

## 2011-08-29 MED ORDER — POTASSIUM CHLORIDE 20 MEQ/15ML (10%) PO LIQD
40.0000 meq | Freq: Two times a day (BID) | ORAL | Status: DC
  Administered 2011-08-29 – 2011-09-02 (×8): 40 meq
  Filled 2011-08-29 (×14): qty 30

## 2011-08-29 MED ORDER — FAMOTIDINE 20 MG PO TABS
20.0000 mg | ORAL_TABLET | Freq: Every day | ORAL | Status: DC
  Administered 2011-08-29 – 2011-09-06 (×9): 20 mg via ORAL
  Filled 2011-08-29 (×9): qty 1

## 2011-08-29 NOTE — Progress Notes (Signed)
Name: Brandon [name] MRN: [id number] DOB: [birth year]    LOS: 12  PCCM F/U NOTE  History of Present Illness: 78 yr old multiple co morbids, copd. Admitted w/ sepsis in the setting of Dental abscess. Presented to ICU on 4/21 with increased WOB, resp failure requiring 100% NRB and pcxr with concerns rt base infiltrate. PCCM Called to assist with acute hypoxic resp failure.  Lines / Drains  Cultures: [facility] 4/21 x 2>>> 4/17 cdiff neg 4/11 BC x 2>>>neg 4/13 stool>>> yeast 4/13- urine>>>neg 4/12 cdiff neg  Antibiotics: Augmentin>>>4/21 Zosyn 4/21>>>4/21 vanc 4/21>>> ceftaz 4/21>>> clinda 4/21>>>  Tests / Events: Panarex - Severe dental caries bilaterally. Possible peri apical abscess or apicitis involving visualized maxillary teeth. 4/21 hypoxic resp failure, 100% NRB  Subjective Feels better but still has high FIO2 needs.  Vital Signs: BP 135/55  Pulse 87  Temp(Src) 98.9 F (37.2 C) (Oral)  Resp 27  Ht 5\' 9"  (1.753 m)  Wt 67.3 kg (148 lb 5.9 oz)  BMI 21.91 kg/m2  SpO2 100% on 100%  I/O last 3 completed shifts: In: 2641.3 [I.V.:941.3; NG/GT:50; IV Piggyback:1650] Out: 1660 [Urine:710; Emesis/NG output:700; Stool:250]  Physical Examination: General:  No distress, speaking full sentences Neuro:  Nonfocal, alert HEENT:  Horrible dentition, no discrete tenderness or fullness Neck:  Nontender, no swelling Cardiovascular:  s1 s2 RRR Lungs:  Rt base crackles and rhonchi Abdomen: distended, quiet BS, still has liquid stools.  Labs and Imaging:  Lab 08/29/11 0331 08/28/11 1753 08/28/11 1150  NA 139 137 140  K 2.6* 2.6* 2.8*  CL 107 105 108  CO2 19 21 21   BUN 27* 27* 27*  CREATININE 1.93* 1.99* 2.07*  GLUCOSE 105* 118* 123*    Lab 08/29/11 0331 08/28/11 0511 08/27/11 0513  HGB 9.9* 10.7* 10.6*  HCT 31.4* 34.6* 34.1*  WBC 13.7* 25.6* 13.3*  PLT 106* 124* 122*   PCXR: worsening RLL airspace disease.   Assessment and Plan: Hypoxic Resp Failure secondary to new rt  base infiltrate: aspiration PNA vs HCAP. Does not want intubation. NOT a candidate for  NIMV if worsens as this increases aspiration risk Plan: -cont abx as outlined above -keep on dry side from volume status stand-point -pulm hygiene -will need SLP in future and likely objective testing when diet restarted -no diet in setting of ileus  Dental Abscess, PNA and Sepsis- plan -change ABX -npo -For dental extraction at some point if he improves to point he can tolerate it   Hypokalemia / Acute on Chronic CRI: creatinine improved after stopping lasix.   Lab 08/29/11 0331 08/28/11 1753 08/28/11 1150  CREATININE 1.93* 1.99* 2.07*  K 2.6* 2.6* 2.8*  plan: -replace and recheck -cont IVFs  Metabolic acidosis (normal ag): prob d/t diarrhea left   Lab 08/29/11 0331 08/28/11 1753 08/28/11 1150  CO2 19 21 21   K 2.6* 2.6* 2.8*  CL 107 105 108  Plan: -cont current IVFs, might need to consider Bicarb replacement at some point but will hold off on this with hypokalemia  Diarrhea and ileus: has NGT clamped/ abd feels better, but has frequent liquid stools Plan: -cont clamping trial of NGT -check stool for CDiff -correct lytes -d/c ccb for ileus  Leukocytosis   Lab 08/29/11 0331 08/28/11 0511 08/27/11 0513  WBC 13.7* 25.6* 13.3*  plan -secondary to knew PNA, cbc in am   Anemia of chronic disease   Lab 08/29/11 0331 08/28/11 0511 08/27/11 0513  HGB 9.9* 10.7* 10.6*  plan: -monitor -no role  for x fusion  Best practices / Disposition: -->DNR -->Heparin for DVT Px, -->Protonix for GI Px -->diet-npo    [name],[name] 08/29/2011, 9:51 AM   Reviewed above, examined pt, and agree with assessment/plan.  He has RLL PNA with acute hypoxic respiratory failure in setting of poor oral dentition.  He also has ileus with hypokalemia.  Patient and family are clear after meeting with palliative care team that he would not want surgical interventions for his teeth, and would not want  intubation or CPR.  Will defer further medical therapy to hospitalist team.  PCCM will sign off.  Please call if additional assistance needed.  Coralyn Helling, MD 08/29/2011, 11:54 AM Pager:  [phone]

## 2011-08-29 NOTE — Progress Notes (Signed)
ST Cancellation Note  _X__Treatment cancelled today due to medical issues with patient which prohibited therapy:     Note pt on NRB mask and MD documentation of need for SLP "in future" with probable objective testing (MBS) indicated.  No diet in setting of ileus per CCS.    SLP to return next date for eval if MD indicates readiness.  Thanks.  Donavan Burnet, MS [facility] SLP [phone]

## 2011-08-29 NOTE — Progress Notes (Signed)
Occupational Therapy Treatment Patient Details Name: Brandon [name] MRN: [id number] DOB: [birth year] Today's Date: 08/29/2011 Time: 1191-4782 OT Time Calculation (min): 28 min  OT Assessment / Plan / Recommendation Comments on Treatment Session Pt now in SDU on NRB mask:  sats maintained at 100%.  Will update goals if needed on next tx.  Pt needed more assistance today than upon initial eval    Follow Up Recommendations  Skilled nursing facility;Other (comment)    Equipment Recommendations  Defer to next venue    Frequency Min 2X/week   Plan Discharge plan remains appropriate    Precautions / Restrictions Precautions Precautions: Fall Restrictions Weight Bearing Restrictions: No   Pertinent Vitals/Pain 0    ADL  Grooming: Performed;Set up;Brushing hair Where Assessed - Grooming: Supported sitting;Other (comment) (not ready to perform grooming in standing yet) Toilet Transfer: Simulated;Minimal assistance;Other (comment) (mod A sit to stand; spt to recliner) Ambulation Related to ADLs: spt only did not use RW ADL Comments: pt did not have glasses; had diffiiculty seeing    OT Goals ADL Goals Pt Will Perform Grooming: with supervision;Standing at sink ADL Goal: Grooming - Progress: Other (comment) (standing not tolerated today:  will update next tx if needed) Pt Will Transfer to Toilet: with supervision;Regular height toilet;Ambulation ADL Goal: Toilet Transfer - Progress: Other (comment) (not tolerated today:  will update next tx if needed)  Visit Information  Last OT Received On: 08/29/11    Subjective Data      Prior Functioning       Cognition       Mobility Bed Mobility Supine to Sit: 4: Min assist Transfers Sit to Stand: 3: Mod assist   Exercises    Balance    End of Session OT - End of Session Activity Tolerance: Other (comment) (change in condition.  Tolerated short session well)  Marica Otter,  OTR/L [phone] 08/29/2011 Brandon [name] 08/29/2011, 10:58 AM

## 2011-08-29 NOTE — Progress Notes (Signed)
PROGRESS NOTE:   08/29/2011  BP 135/55  Pulse 87  Temp(Src) 98.9 F (37.2 C) (Oral)  Resp 27  Ht 5\' 9"  (1.753 m)  Wt 148 lb 5.9 oz (67.3 kg)  BMI 21.91 kg/m2  SpO2 100%  Lab Results  Component Value Date   WBC 13.7* 08/29/2011   HGB 9.9* 08/29/2011   HCT 31.4* 08/29/2011   MCV 92.9 08/29/2011   PLT 106* 08/29/2011   BMET    Component Value Date/Time   NA 139 08/29/2011 0331   K 2.6* 08/29/2011 0331   CL 107 08/29/2011 0331   CO2 19 08/29/2011 0331   GLUCOSE 105* 08/29/2011 0331   BUN 27* 08/29/2011 0331   CREATININE 1.93* 08/29/2011 0331   CALCIUM 8.6 08/29/2011 0331   CALCIUM 9.2 07/18/2011 0947   GFRNONAA 30* 08/29/2011 0331   GFRAA 35* 08/29/2011 0331   Brandon [name] is an 78 year old male that was previously scheduled for dental procedures in the operating room this morning. Patient develop worsening symptoms of pneumonia and other medical co-morbidities resulted in place him in the step down unit. Family then decided along with the medical team to cancel dental procedures in the operating room for today. Patient is scheduled for palliative care consultation later today.  Medical team as to then contact dental medicine to determine if dental procedures are needed for this admission. Patient does have a primary dentist, Dr. Mendon Callas, that he be seen for followup as an outpatient.  Assessments: Dental problems as previous discussed in the initial dental consultation.  Plan: Please reconsult dental medicine if dental treatment desired for this admission.  Dr.Glenda Kunst [employer] Pager [phone]

## 2011-08-29 NOTE — Progress Notes (Signed)
Patient ZO:XWRUEA HRIDAAN BOUSE      DOB: [birth year]      VWU:[id number]  Patient sleeping, family at the bedside.  His wife is out to dinner.  Remains increased oxygen needs but stable vital signs.  Will continue to follow with as we see which direction the patient's health turns.   Jassmine Vandruff L. Ladona Ridgel, MD MBA The Palliative Medicine Team at [facility] Phone: [phone] Pager: [phone]

## 2011-08-29 NOTE — Progress Notes (Signed)
Subjective: Reports that his breathing has improved. Per nursing has had diarrhea, no nausea or vomiting.   Objective: Vital signs in last 24 hours: Filed Vitals:   08/28/11 2000 08/29/11 0000 08/29/11 0400 08/29/11 0500  BP: 126/53 138/55 131/58   Pulse: 84 90 87   Temp: 100 F (37.8 C) 98.8 F (37.1 C) 98.7 F (37.1 C)   TempSrc: Axillary Axillary Axillary   Resp: 28 25 25    Height:      Weight:    67.3 kg (148 lb 5.9 oz)  SpO2: 99% 100% 100%    Weight change:   Intake/Output Summary (Last 24 hours) at 08/29/11 0754 Last data filed at 08/29/11 1610  Gross per 24 hour  Intake 2266.25 ml  Output   1660 ml  Net 606.25 ml    Physical Exam: General: Awake, Oriented, No acute distress. HEENT: EOMI, nasal cannula in place. Neck: Supple CV: S1 and S2 Lungs: Coarse breath sounds bilaterally Abdomen: Soft, Nontender, slightly distended, high pitched bowel sounds. Ext: Good pulses. Trace edema.  Lab Results:  Basename 08/29/11 0331 08/28/11 1753 08/28/11 0511  NA 139 137 --  K 2.6* 2.6* --  CL 107 105 --  CO2 19 21 --  GLUCOSE 105* 118* --  BUN 27* 27* --  CREATININE 1.93* 1.99* --  CALCIUM 8.6 8.5 --  MG 1.8 -- 1.9  PHOS -- -- --    Basename 08/29/11 0331  AST 23  ALT 26  ALKPHOS 143*  BILITOT 0.3  PROT 5.9*  ALBUMIN 2.1*   No results found for this basename: LIPASE:2,AMYLASE:2 in the last 72 hours  Basename 08/29/11 0331 08/28/11 0511  WBC 13.7* 25.6*  NEUTROABS 9.6* --  HGB 9.9* 10.7*  HCT 31.4* 34.6*  MCV 92.9 95.3  PLT 106* 124*   No results found for this basename: CKTOTAL:3,CKMB:3,CKMBINDEX:3,TROPONINI:3 in the last 72 hours No components found with this basename: POCBNP:3 No results found for this basename: DDIMER:2 in the last 72 hours No results found for this basename: HGBA1C:2 in the last 72 hours No results found for this basename: CHOL:2,HDL:2,LDLCALC:2,TRIG:2,CHOLHDL:2,LDLDIRECT:2 in the last 72 hours No results found for this  basename: TSH,T4TOTAL,FREET3,T3FREE,THYROIDAB in the last 72 hours No results found for this basename: VITAMINB12:2,FOLATE:2,FERRITIN:2,TIBC:2,IRON:2,RETICCTPCT:2 in the last 72 hours  Micro Results: Recent Results (from the past 240 hour(s))  URINE CULTURE     Status: Normal   Collection Time   08/20/11  3:09 PM      Component Value Range Status Comment   Specimen Description URINE, CLEAN CATCH   Final    Special Requests NONE   Final    Culture  Setup Time [id number]   Final    Colony Count NO GROWTH   Final    Culture NO GROWTH   Final    Report Status 08/21/2011 FINAL   Final   STOOL CULTURE     Status: Normal   Collection Time   08/20/11 10:25 PM      Component Value Range Status Comment   Specimen Description STOOL   Final    Special Requests NONE   Final    Culture     Final    Value: ABUNDANT YEAST     NO SALMONELLA, SHIGELLA, CAMPYLOBACTER, OR YERSINIA ISOLATED     Note: REDUCED NORMAL FLORA PRESENT   Report Status 08/24/2011 FINAL   Final   CLOSTRIDIUM DIFFICILE BY PCR     Status: Normal   Collection Time   08/24/11  8:32  PM      Component Value Range Status Comment   C difficile by pcr NEGATIVE  NEGATIVE  Final     Studies/Results: Dg Abd 1 View  08/28/2011  *RADIOLOGY REPORT*  Clinical Data: Evaluate for ileus.  Abdominal pain.  ABDOMEN - 1 VIEW  Comparison: 08/22/2011  Findings: Single supine view of the abdomen and pelvis.  Left lower lumbar spine fixation.  Dense vascular calcifications.  Gas filled large and small bowel loops.  The cecum is similarly moderately distended, at up to 11.9 cm.  The transverse colon is also moderately distended, measuring 8.9 cm.  Decreased from 9.8 cm on the prior.  No pneumatosis.  Limited evaluation for free intraperitoneal air, given technique.  Distal gas identified.  IMPRESSION:  1.  Similar to minimal improvement in diffuse large and small bowel dilatation.  Favor adynamic ileus.  Distal obstruction could look similar. 2.  No  pneumatosis.  Limited evaluation for free intraperitoneal air, secondary to technique.  Original Report Authenticated By: Consuello Bossier, M.D.   Dg Chest Port 1 View  08/29/2011  *RADIOLOGY REPORT*  Clinical Data: Pneumonia.  PORTABLE CHEST - 1 VIEW  Comparison: 08/28/2011.  Findings: Nasogastric tube is followed into the stomach, with the tip projecting beyond the inferior boundary of the film.  Right apical pleural thickening. Right lower lobe air space consolidation persists.  There is developing air space opacification and the right perihilar region.  Left basilar subsegmental atelectasis.  No definite pleural fluid.  IMPRESSION:  Right lower lobe pneumonia.  There may be developing right perihilar involvement.  Follow-up to clearing is recommended.  Original Report Authenticated By: Reyes Ivan, M.D.   Dg Chest Port 1 View  08/28/2011  *RADIOLOGY REPORT*  Clinical Data: Evaluate for worsening pneumonia  PORTABLE CHEST - 1 VIEW  Comparison: 08/27/2011; portion of 02/2012; 05/11/2011  Findings: Grossly unchanged cardiac silhouette and mediastinal contours with atherosclerotic calcifications within a mildly ectatic thoracic aorta.  Interval increase in heterogeneous air space opacities within the right lower lung.  The left hemithorax is unchanged.  No definite pleural effusion or pneumothorax. Unchanged bones.  IMPRESSION: Findings compatible with worsening right lower lung pneumonia. A follow-up chest radiograph in 4 to 6 weeks after treatment is recommended to ensure resolution.  Original Report Authenticated By: Waynard Reeds, M.D.   Dg Chest Port 1 View  08/27/2011  *RADIOLOGY REPORT*  Clinical Data: Wheezing and fever.  PORTABLE CHEST - 1 VIEW  Comparison: Chest x-ray 08/17/2011, CT abdomen pelvis 08/18/2011  Findings: There is perihilar peribronchial thickening.  More focal density at the right lung base may indicate early infiltrate. Heart size is normal.  No edema.  Beneath the left  hemidiaphragm, there are mildly distended, gas filled bowel loops. Additionally, there is a crescentic lucency, favored to represent additional bowel loops rather than free intraperitoneal air based on review of previous CT exams and chest x-rays.  IMPRESSION:  1.  Perihilar bronchitic changes. 2.  Increased density the right lower lobe, consistent with developing infiltrate.  Original Report Authenticated By: Patterson Hammersmith, M.D.    Medications: I have reviewed the patient's current medications. Scheduled Meds:    . amLODipine  10 mg Oral Daily  . antiseptic oral rinse  15 mL Mouth Rinse BID  . aspirin EC  81 mg Oral Daily  . calcitRIOL  0.25 mcg Oral Daily  . cefTAZidime (FORTAZ)  IV  2 g Intravenous Q24H  . citalopram  20 mg Oral  Daily  . clindamycin (CLEOCIN) IV  600 mg Intravenous Q8H  . Flora-Q  1 capsule Oral Daily  . heparin subcutaneous  5,000 Units Subcutaneous Q8H  . potassium chloride  10 mEq Intravenous Q1 Hr x 5  . potassium chloride  10 mEq Intravenous Q1 Hr x 6  . potassium chloride  10 mEq Intravenous Q1 Hr x 6  . potassium chloride      . potassium chloride  40 mEq Oral BID  . potassium chloride  40 mEq Oral Once  . sodium chloride  3 mL Intravenous Q12H  . tiotropium  18 mcg Inhalation Daily  . vancomycin  750 mg Intravenous Q24H  . vitamin A & D      . DISCONTD: cefTAZidime (FORTAZ)  IV  1 g Intravenous Q8H  . DISCONTD: clindamycin (CLEOCIN) IV  600 mg Intravenous Q8H  . DISCONTD: enoxaparin (LOVENOX) injection  30 mg Subcutaneous Q24H  . DISCONTD: feeding supplement  237 mL Oral TID WC  . DISCONTD: piperacillin-tazobactam (ZOSYN)  IV  3.375 g Intravenous Q8H  . DISCONTD: potassium chloride  10 mEq Intravenous Q1 Hr x 4  . DISCONTD: potassium chloride  10 mEq Intravenous Q1 Hr x 6   Continuous Infusions:    . sodium chloride 75 mL/hr at 08/29/11 0000  . DISCONTD: sodium chloride 30 mL/hr at 08/28/11 1327   PRN Meds:.acetaminophen, acetaminophen,  albuterol, ALPRAZolam, alum & mag hydroxide-simeth, ondansetron (ZOFRAN) IV, ondansetron, traMADol-acetaminophen  Assessment/Plan: Consultants:  Dr. Yancey Flemings, Gastroenterology, signed off. Dental surgery - Dr. Kristin Bruins Physical therapy Antibiotics:  Rocephin 08/18/11--->Ceftin 08/19/11.  Flagyl 08/18/11--->08/19/11  Zosyn 08/18/11--->08/18/11, Restarted 08/20/11----> 08/25/2011 Cipro 08/18/11--->08/18/11  Vancomycin 08/22/11--->08/25/2011, 08/28/2011 ---> Augmentin 08/25/2011--->08/28/2011 Ceftazidime 08/28/2011---> Clindamycin 08/28/2011--->  Acute Respiratory Failure with hypoxia / Presumed healthcare associated pneumonia versus aspiration pneumonia Portable chest x-ray shows findings compatible with worsening right lower lobe pneumonia. Continue care in step down.  Appreciate pulmonary critical care input. Antibiotics changed to clindamycin, Ceftazidime, and vancomycin. Wean off O2 as tolerated. Appreciate palliative care input, for now continue medical management.  Abdominal distention / Questionable adynamic ileus on abdominal x-ray / Diarrhea Etiology unclear.  Patient had abdominal x-ray on 08/28/2011 that showed similar to minimal improvement in diffuse large and small bowel dilatation, favor adynamic ileus. Patient having diarrhea without abdominal pain. Continue NG. Clamp NG if no nausea or vomiting, start him on clears and advance as tolerated. Send C diff PCR, though last C diff PCR was negative on 08/19/2011.  AKI (acute kidney injury) / CKD (chronic kidney disease) stage 3, GFR 30-59 ml/min  Acute renal failure likely secondary to sepsis and dieuresis.  Renal function worsened on 08/28/2011 likely due to diuresis from Lasix.  Continue to monitor. Initially IVF stopped due to concerns for pulmonary edema, resumed 08/22/11.  Patient's saline lock on 08/25/2011.  Continue to hold hyzaar. Continue patient on gentle IV fluids.  Diarrhea / Sepsis / Hypotension / Leukocytosis / Metabolic  acidosis, normal anion gap (NAG) Initially, had Index of suspicion initially high for C. difficile colitis given recent antibiotic use and high white blood cell count, but C. difficile PCR studies were negative. Stool cultures on 08/20/2011 was negative.  Metabolic acidosis likely from sepsis and bicarbonate losses in the stool which resolved with bicarbonate replacement therapy.  Initially treated empirically with Flagyl until C. difficile studies came back negative and Flagyl was discontinued.  Was also on initially on empiric Rocephin for the possibility of other sources of infection including the lungs and the  urinary tract. Unfortunately, urine cultures not checked at the time of admission. Urine cultures sent on 08/20/11, but were negative, as expected.  CT scan of abdomen showed possible gallbladder inflammation as well as perinephric stranding. Abdominal ultrasound not consistent with cholecystitis so source of infection felt to be pyelonephritis.  Antibiotics broadened to Zosyn on 08/20/11 to cover pneumonia, pyelonephritis and cholecystitis, which since then has been discontinued. Blood cultures negative to date.  GI consult for help with management: HIDA scan ordered - results with no significant findings; GI signed off 08/24/2011  MRI lumbar spine done on 08/22/2011 to rule out diskitis, given h/o back pain and lumbar laminectomy, which was negative.  Orthopantogram performed on 08/23/2011 showed dental abscess and dental surgery consulted. Probiotics added for/16/2013 per wife's request.  Suspect patient's sepsis may have been from dental abscess.  Suspect patient's diarrhea may have been from antibiotic use.  Dental Abscess  Appreciate input from Dr. Kristin Bruins, Orthopantogram performed on 08/23/2011 showed dental abscess. Patient has been scheduled for scheduled for surgery on 08/29/2011 for extraction of remaining teeth with alveoloplasty and pre-prosthetic surgery.  However the surgery may  need to be rescheduled given patient's declining respiratory status and possible ileus. Patient will need followup with a dentist of his choice for fabrication of upper and lower complete dentures as outpatient. Initially transition the patient from Zosyn and vancomycin to Augmentin, however given the decline transition antibiotics to vancomycin, clindamycin and Fortaz.  Antibiotics since 08/18/2011.  COPD / Dyspnea / Hypoxia  Oxygen saturations maintained above 90%, continue nebulized bronchodilator therapy as needed.  Continue Spiriva. Increased chest congestion noted 08/20/11 felt to be secondary to pulmonary edema, pro-BNP was elevated. Responded to Lasix given 08/20/11. On chest x-ray patient per nursing had increased wheezing, chest x-ray on 08/27/2011 showed perihilar bronchiectatic changes, increased density in the right lower lobe with questionable developing infiltrate.  Patient was redosed with Lasix on 08/27/2011.    ANEMIA OF CHRONIC DISEASE  Normocytic anemia, likely of chronic disease and due to chronic kidney disease.  Hemoglobin stable.  On Aranesp as an outpatient.  History of coronary artery disease/myocardial infarction/Elevated CK-MB  Troponins were negative x 3. Continue aspirin. 2-D echocardiogram on 08/18/2011 showed ejection fraction of 55-60%.   History of Atrial fibrillation / nonsustained ventricular tachycardia  Maintaining normal sinus rhythm on telemetry.  One episode of nonsustained ventricular tachycardia, likely exacerbated by hypokalemia. Repleting potassium. Patient not on anticoagulation.  PERIPHERAL VASCULAR DISEASE  Continue aspirin.  SECONDARY HYPERPARATHYROIDISM  Continue calcitriol.  Alzheimer's disease  Appears stable.  Abnormal LFTs  Likely secondary to sepsis, improving.  Transaminases now normal and alkaline phosphatase still abnormal.  Abdominal ultrasound to evaluate gallbladder/biliary tree performed on 08/19/2011. No significant  evidence of cholecystitis.  Statin on hold consider resuming statin at discharge.  Elevated brain natriuretic peptide (BNP) level / chronic diastolic CHF  Initially, chest x-ray clear with no evidence of pulmonary edema. Suspect initial elevation of pro-BNP secondary to decreased renal clearance and chronic diastolic dysfunction.  Repeat two-dimensional echocardiogram showed normal LV function, grade I diastolic dysfunction.  Lasix 40 mg IV x1 given on 08/20/2011 for increased chest congestion and was given another dose of IV Lasix on 08/27/2011.   Hyponatremia  Likely secondary to dehydration from GI losses, resolved with gentle IV fluids.  Hypokalemia  Secondary to GI losses, repleting orally. Continue replace as needed.  Left renal mass  Consistent with a complex cyst on ultrasound on 08/19/1998 team.  Will need further evaluation as  outpatient, with followup imaging in 3 months.  Enlarged prostate  No complaints of urinary hesitancy.  Right bundle branch block  Chronic.  Thrombocytopenia Chronic.  Continue to monitor.  Prophylaxis SQ Heparin   Code Status: DNR/DNI, wants continued medical management.  Family Communication: Dar[place], Rishel [phone]. Disposition Plan: Patient transitioned to step down for closer monitoring.  Hold surgery which was scheduled for today. Reassess the need for surgery depending on patient's clinical course.   LOS: 12 days  Jacquelin Krajewski A, MD 08/29/2011, 7:54 AM

## 2011-08-29 NOTE — Progress Notes (Signed)
CARE MANAGEMENT NOTE 08/29/2011  Patient:  Brandon [name], Brandon [name]   Account Number:  [id number]  Date Initiated:  08/24/2011  Documentation initiated by:  Raiford Noble  Subjective/Objective Assessment:   PT ADM WITH SEPSIS     Action/Plan:   LIVES WITH WIFE   Anticipated DC Date:  09/01/2011   Anticipated DC Plan:  SKILLED NURSING FACILITY  In-house referral  Clinical Social Worker      DC Planning Services  CM consult      [facility] Choice  NA   Choice offered to / List presented to:  C-3 Spouse   DME arranged  NA      DME agency  NA     HH arranged  NA      HH agency  NA   Status of service:  In process, will continue to follow Medicare Important Message given?  NO (If response is "NO", the following Medicare IM given date fields will be blank) Date Medicare IM given:   Date Additional Medicare IM given:    Discharge Disposition:    Per UR Regulation:  Reviewed for med. necessity/level of care/duration of stay  If discussed at Long Length of Stay Meetings, dates discussed:   08/24/2011    Comments:  [id number] Kinya Meine,RN,BSN,CCM: patient moved down to sdu over weekend due to increased wob and worsening PNA by cxr, on 40% non rebreather, dental surgery cancelled and palliative care consult done,

## 2011-08-29 NOTE — Progress Notes (Signed)
CRITICAL VALUE ALERT  Critical value received:  K+ 2.6  Date of notification:  08/29/2011  Time of notification:  4:37 AM  Critical value read back:yes  Nurse who received alert:  mk  MD notified (1st page):  alva  Time of first page:  [phone]  MD notified (2nd page):  Time of second page:  Responding MD:  alva  Time MD responded:  [phone]

## 2011-08-29 NOTE — Progress Notes (Signed)
eLink Physician-Brief Progress Note Patient Name: GIANCARLOS BERENDT DOB: Apr 21, 1926 MRN: [id number]  Date of Service  08/29/2011   HPI/Events of Note     eICU Interventions  Hypokalemia -repleted    Intervention Category Intermediate Interventions: Electrolyte abnormality - evaluation and management  Hakeem Frazzini V. 08/29/2011, 4:43 AM

## 2011-08-29 NOTE — Progress Notes (Signed)
PT Cancellation Note  Treatment cancelled today due to patient's refusal to participate.  Reports fatigued and wanting to rest.  Family in room and wish for him to rest.  Will continue attempts.  WYNN,CYNDI 08/29/2011, 2:58 PM

## 2011-08-29 NOTE — Progress Notes (Signed)
CSW continues to follow for SNF. ICU CSW now following and received report from Providence Crosby, 5E CSW. Pt had accepted bed at [facility] and plan was to d/c there after dental procedure. CSW reviewed chart and aware that Palliative Team following. CSW will readdress plan for SNF as appropriate. CSW to follow.  Brandon [name], [place] 08/29/2011 9:32 AM [phone]

## 2011-08-29 NOTE — Progress Notes (Signed)
CSW met with wife and daughters to check in and offer support. They are concerned for Pt. They had originally planned for Pt to go to [facility]. Wife currently not sure if she wants to do SNF, but agrees to CSW following and assisting as appropriate. CSW to follow for support and dispo.  [name], [place] 08/29/2011 11:08 AM [phone]

## 2011-08-30 DIAGNOSIS — R1310 Dysphagia, unspecified: Secondary | ICD-10-CM | POA: Diagnosis present

## 2011-08-30 LAB — COMPREHENSIVE METABOLIC PANEL
Alkaline Phosphatase: 143 U/L — ABNORMAL HIGH (ref 39–117)
BUN: 27 mg/dL — ABNORMAL HIGH (ref 6–23)
Chloride: 107 mEq/L (ref 96–112)
GFR calc Af Amer: 35 mL/min — ABNORMAL LOW (ref >90)
Glucose, Bld: 105 mg/dL — ABNORMAL HIGH (ref 70–99)
Potassium: 2.6 mEq/L — CL (ref 3.5–5.1)
Total Bilirubin: 0.3 mg/dL (ref 0.3–1.2)

## 2011-08-30 LAB — CBC
HCT: 31.1 % — ABNORMAL LOW (ref 39.0–52.0)
Hemoglobin: 9.6 g/dL — ABNORMAL LOW (ref 13.0–17.0)
MCH: 29.1 pg (ref 26.0–34.0)
MCHC: 30.9 g/dL (ref 30.0–36.0)
MCV: 94.2 fL (ref 78.0–100.0)

## 2011-08-30 LAB — BASIC METABOLIC PANEL
BUN: 31 mg/dL — ABNORMAL HIGH (ref 6–23)
Calcium: 8.5 mg/dL (ref 8.4–10.5)
GFR calc non Af Amer: 33 mL/min — ABNORMAL LOW (ref >90)
Glucose, Bld: 76 mg/dL (ref 70–99)

## 2011-08-30 MED ORDER — POTASSIUM CHLORIDE 10 MEQ/100ML IV SOLN
10.0000 meq | INTRAVENOUS | Status: AC
  Administered 2011-08-30 (×3): 10 meq via INTRAVENOUS
  Filled 2011-08-30: qty 300

## 2011-08-30 MED ORDER — SODIUM CHLORIDE 0.9 % IV SOLN
INTRAVENOUS | Status: AC
  Administered 2011-08-30: 20:00:00 via INTRAVENOUS

## 2011-08-30 MED ORDER — POTASSIUM CHLORIDE 10 MEQ/100ML IV SOLN
10.0000 meq | INTRAVENOUS | Status: AC
  Administered 2011-08-30 (×6): 10 meq via INTRAVENOUS
  Filled 2011-08-30: qty 600

## 2011-08-30 MED ORDER — POTASSIUM CHLORIDE 10 MEQ/100ML IV SOLN: 10.0000 meq | INTRAVENOUS | Status: DC

## 2011-08-30 NOTE — Progress Notes (Signed)
Physical Therapy Treatment Patient Details Name: Brandon [name] MRN: [id number] DOB: 1925/05/24 Today's Date: 08/30/2011 Time: 1340-1405 PT Time Calculation (min): 25 min  PT Assessment / Plan / Recommendation Comments on Treatment Session  Assisted pt OOB to amb limited distance 2nd max c/o dyspnea.  Pt on mask 15 lts @ 40% sats 85 - 91%.  Spouse present and assisted by following with recliner.    Follow Up Recommendations  Skilled nursing facility    Equipment Recommendations  Defer to next venue    Frequency Min 3X/week   Plan Discharge plan remains appropriate    Precautions / Restrictions Precautions Precautions: Fall Precaution Comments: COPD currently on 15lts mask @ 40% Restrictions Weight Bearing Restrictions: No   Pertinent Vitals/Pain     Mobility  Bed Mobility Supine to Sit: 1: +2 Total assist Supine to Sit: Patient Percentage: 50% Details for Bed Mobility Assistance: HOB elevated 50', increased time and total assist pivot using bed pad to assist Transfers Transfers: Sit to Stand;Stand to Sit Sit to Stand: 1: +2 Total assist;From bed;From chair/3-in-1 Sit to Stand: Patient Percentage: 70% Stand to Sit: 1: +2 Total assist;To chair/3-in-1 Stand to Sit: Patient Percentage: 70% Details for Transfer Assistance: Pt fatigues quickly and requires monitoring O2. Ambulation/Gait Ambulation/Gait Assistance: 1: +2 Total assist Ambulation/Gait: Patient Percentage: 70 Ambulation Distance (Feet): 30 Feet (15' x 2) Assistive device: Rolling walker Ambulation/Gait Assistance Details: Pt demon very limited act tolerance and required + 1 sittting rest break.  Pt on mask 15lts 40% with sats 85 - 91%.  Pt demon 3/4 DOE.  Vc's for purse lip breathing. Gait Pattern: Step-to pattern;Decreased stride length;Trunk flexed Stairs: No Wheelchair Mobility Wheelchair Mobility: No    Exercises     PT Goals Acute Rehab PT Goals PT Goal Formulation: With patient Potential to Achieve  Goals: Fair Pt will go Supine/Side to Sit: Independently PT Goal: Supine/Side to Sit - Progress: Progressing toward goal Pt will go Sit to Supine/Side: Independently PT Goal: Sit to Supine/Side - Progress: Progressing toward goal Pt will go Sit to Stand: with supervision PT Goal: Sit to Stand - Progress: Progressing toward goal Pt will go Stand to Sit: with supervision PT Goal: Stand to Sit - Progress: Progressing toward goal Pt will Ambulate: 51 - 150 feet;with min assist;with least restrictive assistive device PT Goal: Ambulate - Progress: Progressing toward goal  Visit Information  Last PT Received On: 08/30/11 Assistance Needed: +3 or more (equipment (IV,O2,chair))    Subjective Data  Subjective: "I'll try but I just had some spou" Patient Stated Goal: Rehab to go home   Cognition  Overall Cognitive Status: Appears within functional limits for tasks assessed/performed Arousal/Alertness: Awake/alert Orientation Level: Appears intact for tasks assessed Behavior During Session: [facility] for tasks performed Cognition - Other Comments: A x O x 4, pleasant, motivated    Balance     End of Session PT - End of Session Equipment Utilized During Treatment: Gait belt Activity Tolerance: Treatment limited secondary to medical complications (Comment) (dyspnea) Patient left: in chair;with call bell/phone within reach;with family/visitor present    Felecia Shelling  PTA [facility]  Acute  Rehab Pager     [phone]

## 2011-08-30 NOTE — Progress Notes (Signed)
Informed IV team of routine IV restart due to current expired IV in LUA.  IV is patent with no S&S of infection or infiltration.  Will continue to monitor.  IV team stated they can change IV as soon as possible.  Barrie Lyme 1:24 PM 08/30/2011

## 2011-08-30 NOTE — Evaluation (Signed)
Clinical/Bedside Swallow Evaluation Patient Details  Name: Brandon [name] MRN: [id number] DOB: 16-Oct-1925 Today's Date: 08/30/2011  Past Medical History:  Past Medical History  Diagnosis Date  . COPD (chronic obstructive pulmonary disease)   . Osteoporosis   . GERD (gastroesophageal reflux disease)   . Hypertension   . Anemia   . Arthritis   . Depression   . Cerebrovascular disease, unspecified 02/2008    Carotid U/S- right internal carotid 60-79%, left internal carotid artery 40-59%  . Secondary hyperparathyroidism (of renal origin)   . Other and unspecified hyperlipidemia   . Peripheral vascular disease, unspecified   . Atrial fibrillation   . Unspecified disorder resulting from impaired renal function   . Hiatal hernia   . Hyperkalemia     Due to ACE Inhibitors  . Varicosities     LE  . CVA (cerebral infarction)   . Lumbago   . Lumbosacral spondylosis without myelopathy   . Chronic pain syndrome   . Diverticulosis 08/18/2011  . Enlarged prostate 08/18/2011  . CKD (chronic kidney disease) stage 3, GFR 30-59 ml/min 08/18/2011  . Left renal mass 08/18/2011  . Right bundle branch block 08/18/2011  . Diastolic CHF, chronic 08/19/2011    Study Conclusions  - Left ventricle: The cavity size was normal. Wall thickness was normal. Systolic function was normal. The estimated ejection fraction was in the range of 55% to 60%. Images were inadequate for LV wall motion assessment. Doppler parameters are consistent with abnormal left ventricular relaxation (grade 1 diastolic dysfunction). - Aortic valve: Poorly visualized. - Mitral valve: Poorly visualized. - Left atrium: Poorly visualized. - Right ventricle: Poorly visualized. Probably normal size/systolic function. - Tricuspid valve: Poorly visualized. - Pulmonic valve: Poorly visualized. - Systemic veins: IVC not visualized. Impressions:  - Grossly normal LV systolic function. Otherwise, this echo is not technically adequate to accurately  comment on other structures.      Past Surgical History:  Past Surgical History  Procedure Date  . Lumbar fusion 01/2008   HPI:  78 yo male adm to [facility] with respiratory failure dental abscess, sepsis.  Pt with h/o CVA, chronic pain, hyperkalemia, anemia, HH, GERD, diastolic CHF, diverticulosis.  Pt CXR 08/28/11 Right lower lobe pneumonia. There may be developing right perihilar involvement.  Pt was to have dental surgery, but unable currently.  Pt hospital coarse complicated by ileus, s/p NG placement, pt removed in early this am.  Order for swallow evaluation was received yesterday, but pt with NG in place and was not ready for evaluation.  Hospitalist reordered evalaution today-pt now with NG removed.  Pt had been receiving chips and sips and he reports tolerating well.     Assessment/Recommendations/Treatment Plan Suspected Esophageal Findings Suspected Esophageal Findings: Other (comment) (h/o hiatal hernia, GERD pt denies symptoms)  SLP Assessment Clinical Impression Statement: Due to pt's chronic pulmonary issues with h/o smoking impacting coordination of swallowing/breathing, chronic episodic aspiration suspected.  Pt reports "choking" with meals everyday and continued pill dysphagia x45 years.  Pt denies requiring heimilich manuever and states he feels that he has a constant lung infection.  Do not anticipate MBS would change this pt's careplan, due to chronicity of his dysphagia exacerbated by dentition issues and his COPD.    In addition, pt did has been taking his po medications at  home with water (though he GROSSLY silently aspirated thin when taking  pill.).  Pt reports he drinks plenty of water at home, which SLP advised was pH neutral and may  be tolerated best by lung when aspirated.  Pt reports swallowing ability is near his baseline.  Pt expressed desire for po intake, with acceptance of episodic aspiration risks - pulmonary ramifications more significant currently due to current  medical condition.  Note palliative care also following pt.  Educated pt to precautions to decrease aspiration events.  Family not present currently, SLP will follow up to assure pt/family educated fully.  Thanks for the consult.   Risk for Aspiration: Severe Other Related Risk Factors: History of dysphagia;History of pneumonia;History of GERD;Previous CVA;Decreased respiratory status;Cognitive impairment;Decreased management of secretions  Swallow Evaluation Recommendations Diet Recommendations: Dysphagia 3 (Mechanical Soft);Thin liquid (with KNOWN RISKS OF ASPIRATION-WATER with meals) Liquid Administration via: Cup Medication Administration: Other (Comment) (with puree follow with water) Supervision: Full supervision/cueing for compensatory strategies;Patient able to self feed Compensations: Slow rate;Small sips/bites;Follow solids with liquid;Take rest break if short of breath or coughing with po. Postural Changes and/or Swallow Maneuvers: Seated upright 90 degrees;Upright 30-60 min after meal Oral Care Recommendations: Oral care QID  Treatment Plan Speech Therapy Frequency: min 1 x/week Treatment Duration: 1 week Interventions: Aspiration precaution training;Patient/family education;Compensatory techniques  Prognosis Prognosis for Safe Diet Advancement: Guarded Barriers to Reach Goals: Severity of dysphagia;Time post onset;Cognitive deficits  Individuals Consulted Consulted and Agree with Results and Recommendations: Patient  Swallowing Goals  SLP Swallowing Goals Patient will utilize recommended strategies during swallow to increase swallowing safety with: Maximum assistance  Swallow Study Prior Functional Status   dysphagia at home, coughing with all meals at home per pt..with solids more than liquids.    General  HPI: 78 yo male adm to [facility] with respiratory failure dental abscess, sepsis.  Pt with h/o CVA, chronic pain, hyperkalemia, anemia, HH, GERD, diastolic CHF,  diverticulosis.  Pt CXR 08/28/11 Right lower lobe pneumonia. There may be developing right perihilar involvement.  Pt was to have dental surgery, but unable currently.  Pt hospital coarse complicated by ileus, s/p NG placement, pt removed in early this am.  Order for swallow evaluation was received yesterday, but pt with NG in place and was not ready for evaluation.  Hospitalist reordered evalaution today-pt now with NG removed.  Pt had been receiving chips and sips and he reports tolerating well.   Type of Study: Bedside swallow evaluation Previous Swallow Assessment: MBS 2010 Gross silent aspiration of thin when taking po medications:  mild oropharyngeal dysphagia Diet Prior to this Study: NPO Temperature Spikes Noted: Yes Respiratory Status: Supplemental O2 delivered via (comment) (facemask) Behavior/Cognition: Alert;Cooperative;Confused;Impulsive Oral Cavity - Dentition: Poor condition Vision: Functional for self-feeding Patient Positioning: Upright in bed Baseline Vocal Quality: Clear (wet voice at times, laryngeal secretion retention suspected) Volitional Swallow: Able to elicit  Oral Motor/Sensory Function  Overall Oral Motor/Sensory Function: Appears within functional limits for tasks assessed (no focal cranial nerve deficits)  Consistency Results  Ice Chips Ice chips: Not tested  Thin Liquid Thin Liquid: Impaired Presentation: Cup;Straw;Self Fed Pharyngeal  Phase Impairments: Multiple swallows;Cough - Delayed Other Comments: inhalation episodically noted after swallow, intermittent cough (nonproductive)  Nectar Thick Liquid Nectar Thick Liquid: Impaired Presentation: Cup Pharyngeal Phase Impairments: Multiple swallows;Cough - Delayed Other Comments: intermittent cough noted  Honey Thick Liquid Honey Thick Liquid: Not tested  Puree Puree: Impaired Presentation: Self Fed;Spoon Pharyngeal Phase Impairments: Cough - Immediate Other Comments: cough between subsequently  swallows observed, ? premature spillage of bolus into pharynx/larynx   Solid Solid: Impaired Presentation: Self Fed Pharyngeal Phase Impairments: Cough - Delayed;Multiple swallows   Donavan Burnet, MS  [facility] SLP [phone]

## 2011-08-30 NOTE — Progress Notes (Addendum)
Interim progress note: [name] is an 78 year old man with a PMH of chronic renal failure, hypertension, Alzheimer's disease, COPD, atrial fibrillation, recent treatment with antibiotics for bronchitis who was brought to the ER on 08/18/11 with the acute onset of diarrhea and dyspnea. Upon initial evaluation, he was found to be hypotensive with marked leukocytosis. The source of his sepsis has not been entirely clear and he initially improved in the first 24 hours but deteriorated when his antibiotics were narrowed.  He had orthopantogram performed on 08/23/2011 showed dental abscess which was presumed to be the likely source for his sepsis.  Initially the plan had been for the patient to have surgery on 08/29/2011 for extraction of his remaining teeth.  Patient given his improvement and was eventually transitioned to a non-telemetry floor.  However on the morning of 08/28/2011 patient had acute hypoxic respiratory failure for imaging showed infiltrates in the right base and patient was placed on 100% nonrebreather mask.  He was transitioned back to step down.  Had goals of care discussion with the patient and family on 08/28/2011 who at that time indicated that he wished to be DO NOT RESUSCITATE/DO NOT INTUBATE but wanted continue medical management.  Patient since then has improved on antibiotics.  Subjective: Initially this morning the patient was confused, since then has improved and was walking with physical therapy and has had improved appetite.   Objective: Vital signs in last 24 hours: Filed Vitals:   08/30/11 0000 08/30/11 0200 08/30/11 0400 08/30/11 0800  BP: 153/54 152/68 144/52   Pulse: 92 76 47   Temp:   97.9 F (36.6 C) 98.2 F (36.8 C)  TempSrc:   Oral Oral  Resp: 20 24 25    Height:      Weight:    65.5 kg (144 lb 6.4 oz)  SpO2: 97% 99% 99%    Weight change:   Intake/Output Summary (Last 24 hours) at 08/30/11 1055 Last data filed at 08/30/11 0650  Gross per 24 hour  Intake    1250 ml  Output   1750 ml  Net   -500 ml    Physical Exam: General: Awake, Oriented, No acute distress. HEENT: EOMI, nasal cannula in place. Neck: Supple CV: S1 and S2 Lungs: Coarse breath sounds bilaterally Abdomen: Soft, Nontender, slightly distended, high pitched bowel sounds. Ext: Good pulses. Trace edema.  Lab Results:  Basename 08/30/11 0335 08/29/11 1542 08/29/11 0331  NA 141 140 --  K 2.4* 2.9* --  CL 111 109 --  CO2 17* 18* --  GLUCOSE 76 87 --  BUN 31* 28* --  CREATININE 1.79* 1.79* --  CALCIUM 8.5 8.3* --  MG 1.8 -- 1.8  PHOS -- -- --    Basename 08/29/11 0331  AST 23  ALT 26  ALKPHOS 143*  BILITOT 0.3  PROT 5.9*  ALBUMIN 2.1*   No results found for this basename: LIPASE:2,AMYLASE:2 in the last 72 hours  Basename 08/30/11 0335 08/29/11 0331  WBC 10.0 13.7*  NEUTROABS -- 9.6*  HGB 9.6* 9.9*  HCT 31.1* 31.4*  MCV 94.2 92.9  PLT 98* 106*   No results found for this basename: CKTOTAL:3,CKMB:3,CKMBINDEX:3,TROPONINI:3 in the last 72 hours No components found with this basename: POCBNP:3 No results found for this basename: DDIMER:2 in the last 72 hours No results found for this basename: HGBA1C:2 in the last 72 hours No results found for this basename: CHOL:2,HDL:2,LDLCALC:2,TRIG:2,CHOLHDL:2,LDLDIRECT:2 in the last 72 hours No results found for this basename: TSH,T4TOTAL,FREET3,T3FREE,THYROIDAB in  the last 72 hours No results found for this basename: VITAMINB12:2,FOLATE:2,FERRITIN:2,TIBC:2,IRON:2,RETICCTPCT:2 in the last 72 hours  Micro Results: Recent Results (from the past 240 hour(s))  URINE CULTURE     Status: Normal   Collection Time   08/20/11  3:09 PM      Component Value Range Status Comment   Specimen Description URINE, CLEAN CATCH   Final    Special Requests NONE   Final    Culture  Setup Time [id number]   Final    Colony Count NO GROWTH   Final    Culture NO GROWTH   Final    Report Status 08/21/2011 FINAL   Final   STOOL CULTURE      Status: Normal   Collection Time   08/20/11 10:25 PM      Component Value Range Status Comment   Specimen Description STOOL   Final    Special Requests NONE   Final    Culture     Final    Value: ABUNDANT YEAST     NO SALMONELLA, SHIGELLA, CAMPYLOBACTER, OR YERSINIA ISOLATED     Note: REDUCED NORMAL FLORA PRESENT   Report Status 08/24/2011 FINAL   Final   CLOSTRIDIUM DIFFICILE BY PCR     Status: Normal   Collection Time   08/24/11  8:32 PM      Component Value Range Status Comment   C difficile by pcr NEGATIVE  NEGATIVE  Final   CULTURE, BLOOD (ROUTINE X 2)     Status: Normal (Preliminary result)   Collection Time   08/28/11  8:15 AM      Component Value Range Status Comment   Specimen Description BLOOD RIGHT ARM   Final    Special Requests     Final    Value: BOTTLES DRAWN AEROBIC AND ANAEROBIC 4CC BOTH BOTTLES   Culture  Setup Time [id number]   Final    Culture     Final    Value:        BLOOD CULTURE RECEIVED NO GROWTH TO DATE CULTURE WILL BE HELD FOR 5 DAYS BEFORE ISSUING A FINAL NEGATIVE REPORT   Report Status PENDING   Incomplete   CULTURE, BLOOD (ROUTINE X 2)     Status: Normal (Preliminary result)   Collection Time   08/28/11  8:30 AM      Component Value Range Status Comment   Specimen Description BLOOD RIGHT HAND   Final    Special Requests     Final    Value: BOTTLES DRAWN AEROBIC AND ANAEROBIC 5CC BOTH BOTTLES   Culture  Setup Time [id number]   Final    Culture     Final    Value:        BLOOD CULTURE RECEIVED NO GROWTH TO DATE CULTURE WILL BE HELD FOR 5 DAYS BEFORE ISSUING A FINAL NEGATIVE REPORT   Report Status PENDING   Incomplete   CULTURE, RESPIRATORY     Status: Normal (Preliminary result)   Collection Time   08/28/11 11:45 AM      Component Value Range Status Comment   Specimen Description SPUTUM   Final    Special Requests NONE   Final    Gram Stain     Final    Value: NO WBC SEEN     RARE SQUAMOUS EPITHELIAL CELLS PRESENT     NO ORGANISMS SEEN    Culture FEW CANDIDA ALBICANS   Final    Report Status PENDING   Incomplete  CLOSTRIDIUM DIFFICILE BY PCR     Status: Normal   Collection Time   08/29/11  7:59 AM      Component Value Range Status Comment   C difficile by pcr NEGATIVE  NEGATIVE  Final     Studies/Results: Dg Abd 1 View  08/28/2011  *RADIOLOGY REPORT*  Clinical Data: Evaluate for ileus.  Abdominal pain.  ABDOMEN - 1 VIEW  Comparison: 08/22/2011  Findings: Single supine view of the abdomen and pelvis.  Left lower lumbar spine fixation.  Dense vascular calcifications.  Gas filled large and small bowel loops.  The cecum is similarly moderately distended, at up to 11.9 cm.  The transverse colon is also moderately distended, measuring 8.9 cm.  Decreased from 9.8 cm on the prior.  No pneumatosis.  Limited evaluation for free intraperitoneal air, given technique.  Distal gas identified.  IMPRESSION:  1.  Similar to minimal improvement in diffuse large and small bowel dilatation.  Favor adynamic ileus.  Distal obstruction could look similar. 2.  No pneumatosis.  Limited evaluation for free intraperitoneal air, secondary to technique.  Original Report Authenticated By: Consuello Bossier, M.D.   Dg Chest Port 1 View  08/29/2011  *RADIOLOGY REPORT*  Clinical Data: Pneumonia.  PORTABLE CHEST - 1 VIEW  Comparison: 08/28/2011.  Findings: Nasogastric tube is followed into the stomach, with the tip projecting beyond the inferior boundary of the film.  Right apical pleural thickening. Right lower lobe air space consolidation persists.  There is developing air space opacification and the right perihilar region.  Left basilar subsegmental atelectasis.  No definite pleural fluid.  IMPRESSION:  Right lower lobe pneumonia.  There may be developing right perihilar involvement.  Follow-up to clearing is recommended.  Original Report Authenticated By: Reyes Ivan, M.D.    Medications: I have reviewed the patient's current medications. Scheduled Meds:     . antiseptic oral rinse  15 mL Mouth Rinse BID  . aspirin EC  81 mg Oral Daily  . calcitRIOL  0.25 mcg Oral Daily  . cefTAZidime (FORTAZ)  IV  2 g Intravenous Q24H  . citalopram  20 mg Oral Daily  . clindamycin (CLEOCIN) IV  600 mg Intravenous Q8H  . famotidine  20 mg Oral Daily  . Flora-Q  1 capsule Oral Daily  . heparin subcutaneous  5,000 Units Subcutaneous Q8H  . potassium chloride  10 mEq Intravenous Q1 Hr x 6  . potassium chloride  10 mEq Intravenous Q1 Hr x 6  . potassium chloride  40 mEq Per Tube BID  . sodium chloride  3 mL Intravenous Q12H  . tiotropium  18 mcg Inhalation Daily  . vancomycin  750 mg Intravenous Q24H   Continuous Infusions:    . sodium chloride 50 mL/hr at 08/29/11 1553   PRN Meds:.acetaminophen, acetaminophen, albuterol, ALPRAZolam, alum & mag hydroxide-simeth, ondansetron (ZOFRAN) IV, ondansetron, traMADol-acetaminophen  Assessment/Plan: Consultants:  Dr. Yancey Flemings, Gastroenterology, signed off. Dental surgery - Dr. Kristin Bruins Dr. Tyson Alias, pulmonary critical care, signed off. Physical therapy Speech therapy Antibiotics:  Rocephin 08/18/11--->08/19/11.  Flagyl 08/18/11--->08/19/11  Zosyn 08/18/11--->08/18/11, Restarted 08/20/11----> 08/25/2011 Cipro 08/18/11--->08/18/11  Vancomycin 08/22/11--->08/25/2011, Restarted 08/28/2011 ---> Augmentin 08/25/2011--->08/28/2011 Ceftazidime 08/28/2011---> Clindamycin 08/28/2011--->  Acute Respiratory Failure with hypoxia / Presumed healthcare associated pneumonia versus aspiration pneumonia Portable chest x-ray shows findings compatible with worsening right lower lobe pneumonia. Continue care in step down. Antibiotics changed to clindamycin, Ceftazidime, and vancomycin on 08/28/2011. Wean off O2 as tolerated. Appreciate palliative care input, for now continue medical management.  Define 8 day course of antibiotics from 08/28/2011 to treat for aspiration/healthcare associated pneumonia. Decrease oxygen requirements as  tolerated.  Abdominal distention / Questionable adynamic ileus on abdominal x-ray / Diarrhea Etiology unclear.  Patient had abdominal x-ray on 08/28/2011 that showed similar to minimal improvement in diffuse large and small bowel dilatation, favor adynamic ileus. NG clamped yesterday, patient discontinued NG on 08/30/2011.  Start the patient on a diet. C. difficile PCR negative on 08/29/2011, 08/24/2011 and 08/19/2011.  Dysphagia Evaluated by speech therapy, suspect patient has chronic episodic aspiration. Knowing the patient has aspiration risk, patient started on dysphagia 3 diet with thin liquids.  AKI (acute kidney injury) / CKD (chronic kidney disease) stage 3, GFR 30-59 ml/min  Acute renal failure likely secondary to sepsis and dieuresis.  Renal function worsened on 08/28/2011 likely due to diuresis from Lasix.  Slowly continues to improve. Initially IVF stopped due to concerns for pulmonary edema, resumed 08/22/11.  Patient's saline lock on 08/25/2011.  Continue to hold hyzaar. Continue patient on gentle IV fluids.  Diarrhea / Sepsis / Hypotension / Leukocytosis / Metabolic acidosis, normal anion gap (NAG) Initially, had Index of suspicion initially high for C. difficile colitis given recent antibiotic use and high white blood cell count, but C. difficile PCR studies were negative. Stool cultures on 08/20/2011 was negative.  Metabolic acidosis likely from sepsis and bicarbonate losses in the stool which resolved with bicarbonate replacement therapy.  Initially treated empirically with Flagyl until C. difficile studies came back negative and Flagyl was discontinued.  Was also on initially on empiric Rocephin for the possibility of other sources of infection including the lungs and the urinary tract. Unfortunately, urine cultures not checked at the time of admission. Urine cultures sent on 08/20/11, but were negative, as expected.  CT scan of abdomen showed possible gallbladder inflammation as  well as perinephric stranding. Abdominal ultrasound not consistent with cholecystitis so source of infection felt to be pyelonephritis.  Antibiotics broadened to Zosyn on 08/20/11 to cover pneumonia, pyelonephritis and cholecystitis, which since then has been discontinued. Blood cultures negative to date.  GI consult for help with management: HIDA scan ordered - results with no significant findings; GI signed off 08/24/2011  MRI lumbar spine done on 08/22/2011 to rule out diskitis, given h/o back pain and lumbar laminectomy, which was negative.  Orthopantogram performed on 08/23/2011 showed dental abscess and dental surgery consulted. Probiotics added for/16/2013 per wife's request.  Suspect patient's sepsis may have been from dental abscess.  Suspect patient's diarrhea may have been from antibiotic use.  Dental Abscess  Appreciate input from Dr. Kristin Bruins, Orthopantogram performed on 08/23/2011 showed dental abscess. Patient has been scheduled for scheduled for surgery on 08/29/2011 for extraction of remaining teeth with alveoloplasty and pre-prosthetic surgery.  However the surgery may need to be rescheduled given patient's declining respiratory status and possible ileus. Patient will need followup with a dentist of his choice for fabrication of upper and lower complete dentures as outpatient once the patient has surgery for extraction of his teeth. Initially transition the patient from Zosyn and vancomycin to Augmentin, however given the decline transition antibiotics to vancomycin, clindamycin and Fortaz.  Antibiotics since 08/18/2011.  COPD / Dyspnea / Hypoxia  Oxygen saturations maintained above 90%, continue nebulized bronchodilator therapy as needed.  Continue Spiriva. Increased chest congestion noted 08/20/11 felt to be secondary to pulmonary edema, pro-BNP was elevated. Responded to Lasix given 08/20/11. On chest x-ray patient per nursing had increased wheezing, chest x-ray on 08/27/2011 showed  perihilar bronchiectatic changes, increased density in the right lower lobe with questionable developing infiltrate.  Patient was redosed with Lasix on 08/27/2011.    ANEMIA OF CHRONIC DISEASE  Normocytic anemia, likely of chronic disease and due to chronic kidney disease.  Hemoglobin stable.  On Aranesp as an outpatient.  History of coronary artery disease/myocardial infarction/Elevated CK-MB  Troponins were negative x 3. Continue aspirin. 2-D echocardiogram on 08/18/2011 showed ejection fraction of 55-60%.   History of Atrial fibrillation / nonsustained ventricular tachycardia  Patient went into A. fib today, has been severely hypokalemic with sepsis from healthcare associated pneumonia.  May convert to sinus rhythm once acute medical issues are resolved. One episode of nonsustained ventricular tachycardia, likely exacerbated by hypokalemia. Repleting potassium. Patient not a good candidate for anticoagulation at this time, assess for need for anticoagulation prior to discharge.    PERIPHERAL VASCULAR DISEASE  Continue aspirin.  SECONDARY HYPERPARATHYROIDISM  Continue calcitriol.  Alzheimer's disease  Appears stable.  Abnormal LFTs  Likely secondary to sepsis, improving.  Transaminases now normal and alkaline phosphatase still abnormal.  Abdominal ultrasound to evaluate gallbladder/biliary tree performed on 08/19/2011. No significant evidence of cholecystitis.  Statin on hold consider resuming statin at discharge.  Elevated brain natriuretic peptide (BNP) level / chronic diastolic CHF  Initially, chest x-ray clear with no evidence of pulmonary edema. Suspect initial elevation of pro-BNP secondary to decreased renal clearance and chronic diastolic dysfunction.  Repeat two-dimensional echocardiogram showed normal LV function, grade I diastolic dysfunction.  Lasix 40 mg IV x1 given on 08/20/2011 for increased chest congestion and was given another dose of IV Lasix on 08/27/2011.    Hyponatremia  Likely secondary to dehydration from GI losses, resolved with gentle IV fluids.  Hypokalemia  Secondary to GI losses, repleting orally and IV. Continue replace as needed.  Left renal mass  Consistent with a complex cyst on ultrasound on 08/19/1998 team.  Will need further evaluation as outpatient, with followup imaging in 3 months.  Enlarged prostate  No complaints of urinary hesitancy.  Right bundle branch block  Chronic.  Thrombocytopenia Chronic.  Continue to monitor.  Prophylaxis SQ Heparin   Code Status: DNR/DNI, wants continued medical management.  Family Communication: Juston, Goheen [phone]. Disposition Plan: Continue care in step down for closer monitoring, if patient continues to improve consider transitioning the patient to telemetry in the next 24-48 hours.  Hold surgery which was scheduled for 08/29/2011. Reassess the need for surgery depending on patient's clinical course and contact Dr. Kristin Bruins.   LOS: 13 days  Lilee Aldea A, MD 08/30/2011, 10:55 AM

## 2011-08-30 NOTE — Progress Notes (Signed)
Sent text page informing Dr. Betti Cruz of Afib on tele monitor.  Patient resting comfortably.    Brandon [name] 1:05 PM 08/30/2011

## 2011-08-30 NOTE — Progress Notes (Signed)
Sent a text page to Dr. Betti Cruz due to family request to re-evaluate continued need for IV antibiotics.  Family feels that perhaps patient would benefit from continuing use of IV antibiotics.  Dr. Betti Cruz stated he would see the family soon today.  Brandon [name] 4:35 PM 08/30/2011

## 2011-08-30 NOTE — Progress Notes (Signed)
Sent Dr. Betti Cruz text page asking him to speak with family at bedside per family request.  Brandon [name] 10:08 AM 08/30/2011

## 2011-08-30 NOTE — Progress Notes (Signed)
Pt began to get a little confused early this a.m. NG was pulled out and o2 mask off. NG replacement attempted x2, unsuccessful.NG had been clamped since 4/22 0800 per charting. Samara Deist the on call for [facility] was notified. She stated she will leave a note for rounding MD.  No complaints of nausea from the patient. Will continue to monitor.

## 2011-08-30 NOTE — Progress Notes (Signed)
eLink Physician-Brief Progress Note Patient Name: Brandon [name] DOB: 09-03-1925 MRN: [id number]  Date of Service  08/30/2011   HPI/Events of Note     eICU Interventions  Hypokalemia -repleted    Intervention Category Intermediate Interventions: Electrolyte abnormality - evaluation and management  ALVA,RAKESH V. 08/30/2011, 4:47 AM

## 2011-08-31 LAB — MAGNESIUM: Magnesium: 1.8 mg/dL (ref 1.5–2.5)

## 2011-08-31 LAB — BASIC METABOLIC PANEL
BUN: 29 mg/dL — ABNORMAL HIGH (ref 6–23)
CO2: 16 mEq/L — ABNORMAL LOW (ref 19–32)
Calcium: 8.9 mg/dL (ref 8.4–10.5)
Creatinine, Ser: 1.59 mg/dL — ABNORMAL HIGH (ref 0.50–1.35)
GFR calc Af Amer: 45 mL/min — ABNORMAL LOW (ref >90)

## 2011-08-31 LAB — CBC
HCT: 31.3 % — ABNORMAL LOW (ref 39.0–52.0)
MCH: 28.9 pg (ref 26.0–34.0)
MCV: 93.2 fL (ref 78.0–100.0)
Platelets: 116 10*3/uL — ABNORMAL LOW (ref 150–400)
RDW: 14.9 % (ref 11.5–15.5)

## 2011-08-31 LAB — CULTURE, RESPIRATORY W GRAM STAIN: Gram Stain: NONE SEEN

## 2011-08-31 LAB — VANCOMYCIN, TROUGH: Vancomycin Tr: 14.9 ug/mL (ref 10.0–20.0)

## 2011-08-31 MED ORDER — POTASSIUM CHLORIDE CRYS ER 20 MEQ PO TBCR
20.0000 meq | EXTENDED_RELEASE_TABLET | Freq: Once | ORAL | Status: AC
  Administered 2011-08-31: 20 meq via ORAL
  Filled 2011-08-31: qty 1

## 2011-08-31 MED ORDER — DEXTROSE 5 % IV SOLN
2.0000 g | Freq: Two times a day (BID) | INTRAVENOUS | Status: DC
  Filled 2011-08-31 (×2): qty 2

## 2011-08-31 MED ORDER — DEXTROSE 5 % IV SOLN
2.0000 g | Freq: Two times a day (BID) | INTRAVENOUS | Status: DC
  Administered 2011-08-31 – 2011-09-02 (×4): 2 g via INTRAVENOUS
  Filled 2011-08-31 (×6): qty 2

## 2011-08-31 NOTE — Progress Notes (Signed)
Interim progress note: [name] is an 78 year old man with a PMH of chronic renal failure, hypertension, Alzheimer's disease, COPD, atrial fibrillation, recent treatment with antibiotics for bronchitis who was brought to the ER on 08/18/11 with the acute onset of diarrhea and dyspnea. Upon initial evaluation, he was found to be hypotensive with marked leukocytosis. The source of his sepsis has not been entirely clear and he initially improved in the first 24 hours but deteriorated when his antibiotics were narrowed.  He had orthopantogram performed on 08/23/2011 showed dental abscess which was presumed to be the likely source for his sepsis.  Initially the plan had been for the patient to have surgery on 08/29/2011 for extraction of his remaining teeth.  Patient given his improvement and was eventually transitioned to a non-telemetry floor.  However on the morning of 08/28/2011 patient had acute hypoxic respiratory failure for imaging showed infiltrates in the right base and patient was placed on 100% nonrebreather mask.  He was transitioned back to step down.  Had goals of care discussion with the patient and family on 08/28/2011 who at that time indicated that he wished to be DO NOT RESUSCITATE/DO NOT INTUBATE but wanted continue medical management.  Patient since then has improved on antibiotics.  Subjective: No events overnight, unable to be weaned off ventimask, ate some soup last pm, intermittent increased WoB Objective: Vital signs in last 24 hours: Filed Vitals:   08/31/11 0200 08/31/11 0308 08/31/11 0400 08/31/11 0600  BP: 158/64  111/50 132/61  Pulse: 146  85 74  Temp:   98.9 F (37.2 C)   TempSrc:   Oral   Resp: 27  23 21   Height:      Weight:      SpO2: 84% 96% 97% 98%   Weight change:   Intake/Output Summary (Last 24 hours) at 08/31/11 0722 Last data filed at 08/31/11 0600  Gross per 24 hour  Intake   1660 ml  Output   1050 ml  Net    610 ml    Physical Exam: General:  Awake, Oriented, No acute distress. HEENT: EOMI, nasal cannula in place. Neck: Supple CV: S1 and S2 Lungs: Coarse breath sounds bilaterally, conducted upper airway sounds Abdomen: Soft, Nontender, slightly distended, high pitched bowel sounds. Ext: Good pulses. Trace edema.  Lab Results:  [facility] 08/31/11 0340 08/30/11 0335  NA 144 141  K 3.1* 2.4*  CL 117* 111  CO2 16* 17*  GLUCOSE 114* 76  BUN 29* 31*  CREATININE 1.59* 1.79*  CALCIUM 8.9 8.5  MG 1.8 1.8  PHOS -- --    Basename 08/29/11 0331  AST 23  ALT 26  ALKPHOS 143*  BILITOT 0.3  PROT 5.9*  ALBUMIN 2.1*   No results found for this basename: LIPASE:2,AMYLASE:2 in the last 72 hours  Basename 08/31/11 0340 08/30/11 0335 08/29/11 0331  WBC 8.0 10.0 --  NEUTROABS -- -- 9.6*  HGB 9.7* 9.6* --  HCT 31.3* 31.1* --  MCV 93.2 94.2 --  PLT 116* 98* --   No results found for this basename: CKTOTAL:3,CKMB:3,CKMBINDEX:3,TROPONINI:3 in the last 72 hours No components found with this basename: POCBNP:3 No results found for this basename: DDIMER:2 in the last 72 hours No results found for this basename: HGBA1C:2 in the last 72 hours No results found for this basename: CHOL:2,HDL:2,LDLCALC:2,TRIG:2,CHOLHDL:2,LDLDIRECT:2 in the last 72 hours No results found for this basename: TSH,T4TOTAL,FREET3,T3FREE,THYROIDAB in the last 72 hours No results found for this basename: VITAMINB12:2,FOLATE:2,FERRITIN:2,TIBC:2,IRON:2,RETICCTPCT:2 in the last  72 hours  Micro Results: Recent Results (from the past 240 hour(s))  CLOSTRIDIUM DIFFICILE BY PCR     Status: Normal   Collection Time   08/24/11  8:32 PM      Component Value Range Status Comment   C difficile by pcr NEGATIVE  NEGATIVE  Final   CULTURE, BLOOD (ROUTINE X 2)     Status: Normal (Preliminary result)   Collection Time   08/28/11  8:15 AM      Component Value Range Status Comment   Specimen Description BLOOD RIGHT ARM   Final    Special Requests     Final    Value: BOTTLES  DRAWN AEROBIC AND ANAEROBIC 4CC BOTH BOTTLES   Culture  Setup Time [id number]   Final    Culture     Final    Value:        BLOOD CULTURE RECEIVED NO GROWTH TO DATE CULTURE WILL BE HELD FOR 5 DAYS BEFORE ISSUING A FINAL NEGATIVE REPORT   Report Status PENDING   Incomplete   CULTURE, BLOOD (ROUTINE X 2)     Status: Normal (Preliminary result)   Collection Time   08/28/11  8:30 AM      Component Value Range Status Comment   Specimen Description BLOOD RIGHT HAND   Final    Special Requests     Final    Value: BOTTLES DRAWN AEROBIC AND ANAEROBIC 5CC BOTH BOTTLES   Culture  Setup Time [id number]   Final    Culture     Final    Value:        BLOOD CULTURE RECEIVED NO GROWTH TO DATE CULTURE WILL BE HELD FOR 5 DAYS BEFORE ISSUING A FINAL NEGATIVE REPORT   Report Status PENDING   Incomplete   CULTURE, RESPIRATORY     Status: Normal (Preliminary result)   Collection Time   08/28/11 11:45 AM      Component Value Range Status Comment   Specimen Description SPUTUM   Final    Special Requests NONE   Final    Gram Stain     Final    Value: NO WBC SEEN     RARE SQUAMOUS EPITHELIAL CELLS PRESENT     NO ORGANISMS SEEN   Culture FEW CANDIDA ALBICANS   Final    Report Status PENDING   Incomplete   CLOSTRIDIUM DIFFICILE BY PCR     Status: Normal   Collection Time   08/29/11  7:59 AM      Component Value Range Status Comment   C difficile by pcr NEGATIVE  NEGATIVE  Final     Studies/Results: No results found.  Medications: I have reviewed the patient's current medications. Scheduled Meds:    . antiseptic oral rinse  15 mL Mouth Rinse BID  . aspirin EC  81 mg Oral Daily  . calcitRIOL  0.25 mcg Oral Daily  . cefTAZidime (FORTAZ)  IV  2 g Intravenous Q24H  . citalopram  20 mg Oral Daily  . clindamycin (CLEOCIN) IV  600 mg Intravenous Q8H  . famotidine  20 mg Oral Daily  . Flora-Q  1 capsule Oral Daily  . heparin subcutaneous  5,000 Units Subcutaneous Q8H  . potassium chloride  10 mEq  Intravenous Q1 Hr x 6  . potassium chloride  10 mEq Intravenous Q1 Hr x 3  . potassium chloride  40 mEq Per Tube BID  . potassium chloride  20 mEq Oral Once  . sodium chloride  3  mL Intravenous Q12H  . tiotropium  18 mcg Inhalation Daily  . vancomycin  750 mg Intravenous Q24H  . DISCONTD: potassium chloride  10 mEq Intravenous Q1 Hr x 6   Continuous Infusions:    . sodium chloride Stopped (08/30/11 1319)  . sodium chloride 30 mL/hr at 08/31/11 0600   PRN Meds:.acetaminophen, acetaminophen, albuterol, ALPRAZolam, alum & mag hydroxide-simeth, ondansetron (ZOFRAN) IV, ondansetron, traMADol-acetaminophen  Assessment/Plan: Consultants:  Dr. Yancey Flemings, Gastroenterology, signed off. Dental surgery - Dr. Kristin Bruins Dr. Tyson Alias, pulmonary critical care, signed off. Physical therapy Speech therapy Antibiotics:  Rocephin 08/18/11--->08/19/11.  Flagyl 08/18/11--->08/19/11  Zosyn 08/18/11--->08/18/11, Restarted 08/20/11----> 08/25/2011 Cipro 08/18/11--->08/18/11  Vancomycin 08/22/11--->08/25/2011, Restarted 08/28/2011 ---> Augmentin 08/25/2011--->08/28/2011 Ceftazidime 08/28/2011---> Clindamycin 08/28/2011--->  Acute Respiratory Failure with hypoxia / Presumed healthcare associated pneumonia versus aspiration pneumonia Portable chest x-ray shows findings compatible with worsening right lower lobe pneumonia. Continue care in step down. Antibiotics changed to clindamycin, Ceftazidime, and vancomycin on 08/28/2011. Wean off O2 as tolerated. Appreciate palliative care input, for now continue medical management. Define 8 day course of antibiotics from 08/28/2011 to treat for aspiration/healthcare associated pneumonia, DC vancomycin tomorrow, cultures negative Decrease oxygen requirements as tolerated. KVO IVF  Abdominal distention / Questionable adynamic ileus on abdominal x-ray / Diarrhea Etiology unclear.  Patient had abdominal x-ray on 08/28/2011 that showed similar to minimal improvement in diffuse  large and small bowel dilatation, favor adynamic ileus. NG clamped yesterday, patient discontinued NG on 08/30/2011.  Start the patient on a diet. C. difficile PCR negative on 08/29/2011, 08/24/2011 and 08/19/2011.  Dysphagia Evaluated by speech therapy, suspect patient has chronic episodic aspiration. Knowing the patient has aspiration risk, patient started on dysphagia 3 diet with thin liquids.  AKI (acute kidney injury) / CKD (chronic kidney disease) stage 3, GFR 30-59 ml/min  Acute renal failure likely secondary to sepsis and dieuresis.  Renal function worsened on 08/28/2011 likely due to diuresis from Lasix.  Slowly continues to improve. Initially IVF stopped due to concerns for pulmonary edema, resumed 08/22/11.  Patient's saline lock on 08/25/2011.  Continue to hold hyzaar. Cut down IVF  Diarrhea / Sepsis / Hypotension / Leukocytosis / Metabolic acidosis, normal anion gap (NAG) Initially, had Index of suspicion initially high for C. difficile colitis given recent antibiotic use and high white blood cell count, but C. difficile PCR studies were negative. Stool cultures on 08/20/2011 was negative.  Metabolic acidosis likely from sepsis and bicarbonate losses in the stool which resolved with bicarbonate replacement therapy.  Initially treated empirically with Flagyl until C. difficile studies came back negative and Flagyl was discontinued.  Was also on initially on empiric Rocephin for the possibility of other sources of infection including the lungs and the urinary tract. Unfortunately, urine cultures not checked at the time of admission. Urine cultures sent on 08/20/11, but were negative, as expected.  CT scan of abdomen showed possible gallbladder inflammation as well as perinephric stranding. Abdominal ultrasound not consistent with cholecystitis so source of infection felt to be pyelonephritis.  Antibiotics broadened to Zosyn on 08/20/11 to cover pneumonia, pyelonephritis and cholecystitis,  which since then has been discontinued. Blood cultures negative to date.  GI consult for help with management: HIDA scan ordered - results with no significant findings; GI signed off 08/24/2011  MRI lumbar spine done on 08/22/2011 to rule out diskitis, given h/o back pain and lumbar laminectomy, which was negative.  Orthopantogram performed on 08/23/2011 showed dental abscess and dental surgery consulted. Probiotics added for/16/2013 per wife's request.  Suspect  patient's sepsis may have been from dental abscess.  Suspect patient's diarrhea may have been from antibiotic use.  Dental Abscess  Appreciate input from Dr. Kristin Bruins, Orthopantogram performed on 08/23/2011 showed dental abscess. Patient has been scheduled for scheduled for surgery on 08/29/2011 for extraction of remaining teeth with alveoloplasty and pre-prosthetic surgery.  However the surgery may need to be rescheduled given patient's declining respiratory status and possible ileus. Patient will need followup with a dentist of his choice for fabrication of upper and lower complete dentures as outpatient once the patient has surgery for extraction of his teeth. Initially transition the patient from Zosyn and vancomycin to Augmentin, however given the decline transition antibiotics to vancomycin, clindamycin and Fortaz.  Antibiotics since 08/18/2011.  COPD / Dyspnea / Hypoxia  Oxygen saturations maintained above 90%, continue nebulized bronchodilator therapy as needed.  Continue Spiriva. Increased chest congestion noted 08/20/11 felt to be secondary to pulmonary edema, pro-BNP was elevated. Responded to Lasix given 08/20/11. On chest x-ray patient per nursing had increased wheezing, chest x-ray on 08/27/2011 showed perihilar bronchiectatic changes, increased density in the right lower lobe with questionable developing infiltrate.  Patient was redosed with Lasix on 08/27/2011.   Wean O2  ANEMIA OF CHRONIC DISEASE  Normocytic anemia, likely  of chronic disease and due to chronic kidney disease.  Hemoglobin stable.  On Aranesp as an outpatient.  History of coronary artery disease/myocardial infarction/Elevated CK-MB  Troponins were negative x 3. Continue aspirin. 2-D echocardiogram on 08/18/2011 showed ejection fraction of 55-60%.   History of Atrial fibrillation / nonsustained ventricular tachycardia  Patient went into A. fib today, has been severely hypokalemic with sepsis from healthcare associated pneumonia.  May convert to sinus rhythm once acute medical issues are resolved. One episode of nonsustained ventricular tachycardia, likely exacerbated by hypokalemia. Repleting potassium. Patient not a good candidate for anticoagulation at this time, assess for need for anticoagulation prior to discharge.    PERIPHERAL VASCULAR DISEASE  Continue aspirin.  SECONDARY HYPERPARATHYROIDISM  Continue calcitriol.  Alzheimer's disease  Appears stable.  Abnormal LFTs  Likely secondary to sepsis, improving.  Transaminases now normal and alkaline phosphatase still abnormal.  Abdominal ultrasound to evaluate gallbladder/biliary tree performed on 08/19/2011. No significant evidence of cholecystitis.  Statin on hold consider resuming statin at discharge.  Elevated brain natriuretic peptide (BNP) level / chronic diastolic CHF  Initially, chest x-ray clear with no evidence of pulmonary edema. Suspect initial elevation of pro-BNP secondary to decreased renal clearance and chronic diastolic dysfunction.  Repeat two-dimensional echocardiogram showed normal LV function, grade I diastolic dysfunction.  Lasix 40 mg IV x1 given on 08/20/2011 for increased chest congestion and was given another dose of IV Lasix on 08/27/2011.   Hyponatremia  Likely secondary to dehydration from GI losses, resolved with gentle IV fluids.  Hypokalemia  Secondary to GI losses, repleting orally and IV. Continue replace as needed.  Left renal mass  Consistent with  a complex cyst on ultrasound on 08/19/1998 team.  Will need further evaluation as outpatient, with followup imaging in 3 months.  Enlarged prostate  No complaints of urinary hesitancy.  Right bundle branch block  Chronic.  Thrombocytopenia Chronic.  Continue to monitor.  Prophylaxis SQ Heparin   Code Status: DNR/DNI, wants continued medical management.  Family Communication: Michelle, [place] [phone]. Disposition Plan: Continue care in step down for closer monitoring, if patient continues to improve consider transitioning the patient to telemetry in the next 24-48 hours.  Hold surgery which was scheduled for 08/29/2011.  Reassess the need for surgery depending on patient's clinical course and contact Dr. Kristin Bruins.   LOS: 14 days  Zannie Cove, MD 08/31/2011, 7:22 AM

## 2011-08-31 NOTE — Progress Notes (Signed)
ANTIBIOTIC CONSULT NOTE - FOLLOW UP  Pharmacy Consult for Vancomycin, Clindamycin, Ceftazidime Indication: aspiration PNA/sepsis/dental abscess  No Known Allergies  Patient Measurements: Height: 5\' 9"  (175.3 cm) Weight: 144 lb 2.9 oz (65.4 kg) IBW/kg (Calculated) : 70.7   Vital Signs: Temp: 97.9 F (36.6 C) (04/24 0812) Temp src: Oral (04/24 0812) BP: 148/66 mmHg (04/24 0812) Pulse Rate: 117  (04/24 0813) Intake/Output from previous day: 04/23 0701 - 04/24 0700 In: 1660 [P.O.:630; I.V.:150; IV Piggyback:750] Out: 1050 [Urine:1050] Intake/Output from this shift: Total I/O In: 391 [I.V.:391] Out: 225 [Urine:225]  Labs:  [facility] 08/31/11 0340 08/30/11 0335 08/29/11 1542 08/29/11 0331  WBC 8.0 10.0 -- 13.7*  HGB 9.7* 9.6* -- 9.9*  PLT 116* 98* -- 106*  LABCREA -- -- -- --  CREATININE 1.59* 1.79* 1.79* --   Estimated Creatinine Clearance: 32.6 ml/min (by C-G formula based on Cr of 1.59).  Basename 08/31/11 0925  VANCOTROUGH 14.9  VANCOPEAK --  VANCORANDOM --  GENTTROUGH --  GENTPEAK --  GENTRANDOM --  TOBRATROUGH --  TOBRAPEAK --  TOBRARND --  AMIKACINPEAK --  AMIKACINTROU --  AMIKACIN --     Microbiology: Recent Results (from the past 720 hour(s))  CULTURE, BLOOD (ROUTINE X 2)     Status: Normal   Collection Time   08/18/11  1:19 AM      Component Value Range Status Comment   Specimen Description BLOOD R FOREARM   Final    Special Requests     Final    Value: BOTTLES DRAWN AEROBIC AND ANAEROBIC 4CC AEROBIC/3CC ANAEROBIC   Culture  Setup Time [id number]   Final    Culture NO GROWTH 5 DAYS   Final    Report Status 08/24/2011 FINAL   Final   CULTURE, BLOOD (ROUTINE X 2)     Status: Normal   Collection Time   08/18/11  1:22 AM      Component Value Range Status Comment   Specimen Description BLOOD RIGHT ANTECUBITAL   Final    Special Requests BOTTLES DRAWN AEROBIC ONLY 5C   Final    Culture  Setup Time [id number]   Final    Culture NO GROWTH 5 DAYS    Final    Report Status 08/24/2011 FINAL   Final   MRSA PCR SCREENING     Status: Normal   Collection Time   08/18/11  2:27 AM      Component Value Range Status Comment   MRSA by PCR NEGATIVE  NEGATIVE  Final   CLOSTRIDIUM DIFFICILE BY PCR     Status: Normal   Collection Time   08/19/11  3:13 AM      Component Value Range Status Comment   C difficile by pcr NEGATIVE  NEGATIVE  Final   URINE CULTURE     Status: Normal   Collection Time   08/20/11  3:09 PM      Component Value Range Status Comment   Specimen Description URINE, CLEAN CATCH   Final    Special Requests NONE   Final    Culture  Setup Time [id number]   Final    Colony Count NO GROWTH   Final    Culture NO GROWTH   Final    Report Status 08/21/2011 FINAL   Final   STOOL CULTURE     Status: Normal   Collection Time   08/20/11 10:25 PM      Component Value Range Status Comment   Specimen Description STOOL   Final  Special Requests NONE   Final    Culture     Final    Value: ABUNDANT YEAST     NO SALMONELLA, SHIGELLA, CAMPYLOBACTER, OR YERSINIA ISOLATED     Note: REDUCED NORMAL FLORA PRESENT   Report Status 08/24/2011 FINAL   Final   CLOSTRIDIUM DIFFICILE BY PCR     Status: Normal   Collection Time   08/24/11  8:32 PM      Component Value Range Status Comment   C difficile by pcr NEGATIVE  NEGATIVE  Final   CULTURE, BLOOD (ROUTINE X 2)     Status: Normal (Preliminary result)   Collection Time   08/28/11  8:15 AM      Component Value Range Status Comment   Specimen Description BLOOD RIGHT ARM   Final    Special Requests     Final    Value: BOTTLES DRAWN AEROBIC AND ANAEROBIC 4CC BOTH BOTTLES   Culture  Setup Time [id number]   Final    Culture     Final    Value:        BLOOD CULTURE RECEIVED NO GROWTH TO DATE CULTURE WILL BE HELD FOR 5 DAYS BEFORE ISSUING A FINAL NEGATIVE REPORT   Report Status PENDING   Incomplete   CULTURE, BLOOD (ROUTINE X 2)     Status: Normal (Preliminary result)   Collection Time   08/28/11   8:30 AM      Component Value Range Status Comment   Specimen Description BLOOD RIGHT HAND   Final    Special Requests     Final    Value: BOTTLES DRAWN AEROBIC AND ANAEROBIC 5CC BOTH BOTTLES   Culture  Setup Time [id number]   Final    Culture     Final    Value:        BLOOD CULTURE RECEIVED NO GROWTH TO DATE CULTURE WILL BE HELD FOR 5 DAYS BEFORE ISSUING A FINAL NEGATIVE REPORT   Report Status PENDING   Incomplete   CULTURE, RESPIRATORY     Status: Normal   Collection Time   08/28/11 11:45 AM      Component Value Range Status Comment   Specimen Description SPUTUM   Final    Special Requests NONE   Final    Gram Stain     Final    Value: NO WBC SEEN     RARE SQUAMOUS EPITHELIAL CELLS PRESENT     NO ORGANISMS SEEN   Culture FEW CANDIDA ALBICANS   Final    Report Status 08/31/2011 FINAL   Final   CLOSTRIDIUM DIFFICILE BY PCR     Status: Normal   Collection Time   08/29/11  7:59 AM      Component Value Range Status Comment   C difficile by pcr NEGATIVE  NEGATIVE  Final     Anti-infectives     Start     Dose/Rate Route Frequency Ordered Stop   08/28/11 1400   cefTAZidime (FORTAZ) 1 g in dextrose 5 % 50 mL IVPB  Status:  Discontinued        1 g 100 mL/hr over 30 Minutes Intravenous 3 times per day 08/28/11 1010 08/28/11 1017   08/28/11 1400   clindamycin (CLEOCIN) IVPB 600 mg  Status:  Discontinued        600 mg 100 mL/hr over 30 Minutes Intravenous 3 times per day 08/28/11 1010 08/28/11 1019   08/28/11 1200   clindamycin (CLEOCIN) IVPB 600 mg  600 mg 100 mL/hr over 30 Minutes Intravenous Every 8 hours 08/28/11 1019     08/28/11 1100   cefTAZidime (FORTAZ) 2 g in dextrose 5 % 50 mL IVPB        2 g 100 mL/hr over 30 Minutes Intravenous Every 24 hours 08/28/11 1017     08/28/11 1000   vancomycin (VANCOCIN) 750 mg in sodium chloride 0.9 % 150 mL IVPB        750 mg 150 mL/hr over 60 Minutes Intravenous Every 24 hours 08/28/11 0848     08/28/11 1000    piperacillin-tazobactam (ZOSYN) IVPB 3.375 g  Status:  Discontinued        3.375 g 12.5 mL/hr over 240 Minutes Intravenous Every 8 hours 08/28/11 0848 08/28/11 1010   08/25/11 2000   amoxicillin-clavulanate (AUGMENTIN) 500-125 MG per tablet 500 mg  Status:  Discontinued        1 tablet Oral 3 times daily 08/25/11 1726 08/28/11 0732   08/22/11 1630   vancomycin (VANCOCIN) IVPB 1000 mg/200 mL premix  Status:  Discontinued        1,000 mg 200 mL/hr over 60 Minutes Intravenous Every 24 hours 08/22/11 1524 08/25/11 1726   08/20/11 1400   metroNIDAZOLE (FLAGYL) tablet 500 mg  Status:  Discontinued        500 mg Oral 3 times per day 08/20/11 1228 08/20/11 1248   08/20/11 1400   piperacillin-tazobactam (ZOSYN) IVPB 3.375 g  Status:  Discontinued        3.375 g 12.5 mL/hr over 240 Minutes Intravenous Every 8 hours 08/20/11 1317 08/25/11 1726   08/20/11 1330   ciprofloxacin (CIPRO) tablet 500 mg  Status:  Discontinued        500 mg Oral 2 times daily 08/20/11 1228 08/20/11 1248   08/19/11 1700   cefUROXime (CEFTIN) tablet 500 mg  Status:  Discontinued        500 mg Oral 2 times daily with meals 08/19/11 1604 08/20/11 1228   08/18/11 0830   metroNIDAZOLE (FLAGYL) tablet 500 mg  Status:  Discontinued        500 mg Oral 3 times per day 08/18/11 0748 08/19/11 1549   08/18/11 0500   cefTRIAXone (ROCEPHIN) 1 g in dextrose 5 % 50 mL IVPB  Status:  Discontinued        1 g 100 mL/hr over 30 Minutes Intravenous Every 24 hours 08/18/11 0336 08/19/11 1604   08/18/11 0400   metroNIDAZOLE (FLAGYL) IVPB 500 mg  Status:  Discontinued        500 mg 100 mL/hr over 60 Minutes Intravenous Every 8 hours 08/18/11 0336 08/18/11 0748   08/18/11 0100  piperacillin-tazobactam (ZOSYN) IVPB 3.375 g       3.375 g 12.5 mL/hr over 240 Minutes Intravenous  Once 08/18/11 0030 08/18/11 0556   08/18/11 0045   ciprofloxacin (CIPRO) IVPB 400 mg  Status:  Discontinued        400 mg 200 mL/hr over 60 Minutes Intravenous  Every 12 hours 08/18/11 0030 08/18/11 0336          Assessment: 85 yom on day #4 vancomycin/clindamycin/ceftazidime for treatment of probable aspiration pneumonia, sepsis, and dental abscess.  Plan for 8 days LOT per physician progress note.  SCr 1.59, CrCl~ 32 mL/min, slightly improved so will adjust ceftazidime dosing from q24h to q12h dosing.  Vancomycin trough therapeutic, so will continue current dosing for now - possible d/c tomorrow.  Goal of Therapy:  Vancomycin trough level  15-20 mcg/ml  Plan:  Adjust ceftazidime to 2g IV q12h. Continue clindamycin and vancomycin and current doses. F/u SCr and culture results.  Clance Boll 08/31/2011,11:23 AM

## 2011-08-31 NOTE — Progress Notes (Signed)
Follow up from PMT rounds- spoke with patient's wife and daughter [name] outside of room; family agreeable to re-meet with PMT to re-address patient/family goals/needs meeting scheduled for Friday, 09/02/11 @ 5:00 pm T/c to Dr Jomarie Longs to notify of above.  Valente David, RN 08/31/2011, 2:57 PM Palliative Medicine Team RN Liaison [phone]

## 2011-08-31 NOTE — Progress Notes (Signed)
Patient is very weak.  He is too weak to ambulate today.  He had difficulty tolerating bed in chair position.

## 2011-09-01 ENCOUNTER — Inpatient Hospital Stay (HOSPITAL_COMMUNITY): Payer: Medicare Other

## 2011-09-01 LAB — CBC
Platelets: 121 10*3/uL — ABNORMAL LOW (ref 150–400)
RDW: 14.9 % (ref 11.5–15.5)
WBC: 6.6 10*3/uL (ref 4.0–10.5)

## 2011-09-01 LAB — BASIC METABOLIC PANEL
Chloride: 116 mEq/L — ABNORMAL HIGH (ref 96–112)
GFR calc Af Amer: 44 mL/min — ABNORMAL LOW (ref >90)
Potassium: 2.8 mEq/L — ABNORMAL LOW (ref 3.5–5.1)

## 2011-09-01 LAB — POTASSIUM: Potassium: 3.3 mEq/L — ABNORMAL LOW (ref 3.5–5.1)

## 2011-09-01 MED ORDER — LOPERAMIDE HCL 2 MG PO CAPS
2.0000 mg | ORAL_CAPSULE | Freq: Two times a day (BID) | ORAL | Status: DC | PRN
  Filled 2011-09-01: qty 1

## 2011-09-01 MED ORDER — POTASSIUM CHLORIDE 20 MEQ/15ML (10%) PO LIQD
40.0000 meq | Freq: Every day | ORAL | Status: DC
  Administered 2011-09-01 – 2011-09-02 (×2): 40 meq via ORAL
  Filled 2011-09-01 (×2): qty 30

## 2011-09-01 MED ORDER — ENSURE PUDDING PO PUDG
1.0000 | Freq: Every day | ORAL | Status: DC
  Administered 2011-09-01: 1 via ORAL
  Filled 2011-09-01 (×2): qty 1

## 2011-09-01 MED ORDER — POTASSIUM CHLORIDE 20 MEQ/15ML (10%) PO LIQD
40.0000 meq | ORAL | Status: AC
  Administered 2011-09-01: 40 meq via ORAL
  Filled 2011-09-01: qty 30

## 2011-09-01 NOTE — Progress Notes (Signed)
Interim progress note: Brandon [name] is an 78 year old man with a PMH of chronic renal failure, hypertension, Alzheimer's disease, COPD, atrial fibrillation, recent treatment with antibiotics for bronchitis who was brought to the ER on 08/18/11 with the acute onset of diarrhea and dyspnea. Upon initial evaluation, he was found to be hypotensive with marked leukocytosis. The source of his sepsis has not been entirely clear and he initially improved in the first 24 hours but deteriorated when his antibiotics were narrowed.  He had orthopantogram performed on 08/23/2011 showed dental abscess which was presumed to be the likely source for his sepsis.  Initially the plan had been for the patient to have surgery on 08/29/2011 for extraction of his remaining teeth.  Patient given his improvement and was eventually transitioned to a non-telemetry floor.  However on the morning of 08/28/2011 patient had acute hypoxic respiratory failure for imaging showed infiltrates in the right base and patient was placed on 100% nonrebreather mask.  He was transitioned back to step down.  Had goals of care discussion with the patient and family on 08/28/2011 who at that time indicated that he wished to be DO NOT RESUSCITATE/DO NOT INTUBATE but wanted continue medical management.  Patient since then has improved on antibiotics.  Subjective: No events overnight, now on 5L [place], eating some, still having profuse diarrhea  Objective: Vital signs in last 24 hours: Filed Vitals:   08/31/11 2200 09/01/11 0000 09/01/11 0055 09/01/11 0555  BP:   159/65 145/79  Pulse: 79  85 85  Temp:  98.7 F (37.1 C)    TempSrc:  Oral    Resp: 28  24 22   Height:      Weight:      SpO2: 100%  100% 100%   Weight change:   Intake/Output Summary (Last 24 hours) at 09/01/11 0759 Last data filed at 09/01/11 0600  Gross per 24 hour  Intake 2218.83 ml  Output   2100 ml  Net 118.83 ml    Physical Exam: General: Awake, Oriented, No acute  distress. HEENT: EOMI, nasal cannula in place. Neck: Supple CV: S1 and S2 Lungs: Coarse breath sounds bilaterally, conducted upper airway sounds Abdomen: Soft, Nontender, distended, high pitched bowel sounds. Ext: Good pulses. Trace edema.  Lab Results:  Basename 09/01/11 0326 08/31/11 0340 08/30/11 0335  NA 144 144 --  K 2.8* 3.1* --  CL 116* 117* --  CO2 16* 16* --  GLUCOSE 98 114* --  BUN 26* 29* --  CREATININE 1.61* 1.59* --  CALCIUM 8.8 8.9 --  MG -- 1.8 1.8  PHOS -- -- --   No results found for this basename: AST:2,ALT:2,ALKPHOS:2,BILITOT:2,PROT:2,ALBUMIN:2 in the last 72 hours No results found for this basename: LIPASE:2,AMYLASE:2 in the last 72 hours  Basename 09/01/11 0326 08/31/11 0340  WBC 6.6 8.0  NEUTROABS -- --  HGB 9.4* 9.7*  HCT 29.7* 31.3*  MCV 92.2 93.2  PLT 121* 116*   No results found for this basename: CKTOTAL:3,CKMB:3,CKMBINDEX:3,TROPONINI:3 in the last 72 hours No components found with this basename: POCBNP:3 No results found for this basename: DDIMER:2 in the last 72 hours No results found for this basename: HGBA1C:2 in the last 72 hours No results found for this basename: CHOL:2,HDL:2,LDLCALC:2,TRIG:2,CHOLHDL:2,LDLDIRECT:2 in the last 72 hours No results found for this basename: TSH,T4TOTAL,FREET3,T3FREE,THYROIDAB in the last 72 hours No results found for this basename: VITAMINB12:2,FOLATE:2,FERRITIN:2,TIBC:2,IRON:2,RETICCTPCT:2 in the last 72 hours  Micro Results: Recent Results (from the past 240 hour(s))  CLOSTRIDIUM DIFFICILE BY PCR  Status: Normal   Collection Time   08/24/11  8:32 PM      Component Value Range Status Comment   C difficile by pcr NEGATIVE  NEGATIVE  Final   CULTURE, BLOOD (ROUTINE X 2)     Status: Normal (Preliminary result)   Collection Time   08/28/11  8:15 AM      Component Value Range Status Comment   Specimen Description BLOOD RIGHT ARM   Final    Special Requests     Final    Value: BOTTLES DRAWN AEROBIC AND  ANAEROBIC 4CC BOTH BOTTLES   Culture  Setup Time [id number]   Final    Culture     Final    Value:        BLOOD CULTURE RECEIVED NO GROWTH TO DATE CULTURE WILL BE HELD FOR 5 DAYS BEFORE ISSUING A FINAL NEGATIVE REPORT   Report Status PENDING   Incomplete   CULTURE, BLOOD (ROUTINE X 2)     Status: Normal (Preliminary result)   Collection Time   08/28/11  8:30 AM      Component Value Range Status Comment   Specimen Description BLOOD RIGHT HAND   Final    Special Requests     Final    Value: BOTTLES DRAWN AEROBIC AND ANAEROBIC 5CC BOTH BOTTLES   Culture  Setup Time [id number]   Final    Culture     Final    Value:        BLOOD CULTURE RECEIVED NO GROWTH TO DATE CULTURE WILL BE HELD FOR 5 DAYS BEFORE ISSUING A FINAL NEGATIVE REPORT   Report Status PENDING   Incomplete   CULTURE, RESPIRATORY     Status: Normal   Collection Time   08/28/11 11:45 AM      Component Value Range Status Comment   Specimen Description SPUTUM   Final    Special Requests NONE   Final    Gram Stain     Final    Value: NO WBC SEEN     RARE SQUAMOUS EPITHELIAL CELLS PRESENT     NO ORGANISMS SEEN   Culture FEW CANDIDA ALBICANS   Final    Report Status 08/31/2011 FINAL   Final   CLOSTRIDIUM DIFFICILE BY PCR     Status: Normal   Collection Time   08/29/11  7:59 AM      Component Value Range Status Comment   C difficile by pcr NEGATIVE  NEGATIVE  Final     Studies/Results: No results found.  Medications: I have reviewed the patient's current medications. Scheduled Meds:    . antiseptic oral rinse  15 mL Mouth Rinse BID  . aspirin EC  81 mg Oral Daily  . calcitRIOL  0.25 mcg Oral Daily  . cefTAZidime (FORTAZ)  IV  2 g Intravenous Q12H  . citalopram  20 mg Oral Daily  . clindamycin (CLEOCIN) IV  600 mg Intravenous Q8H  . famotidine  20 mg Oral Daily  . Flora-Q  1 capsule Oral Daily  . heparin subcutaneous  5,000 Units Subcutaneous Q8H  . potassium chloride  40 mEq Per Tube BID  . potassium chloride  40  mEq Oral Daily  . potassium chloride  40 mEq Oral NOW  . sodium chloride  3 mL Intravenous Q12H  . tiotropium  18 mcg Inhalation Daily  . DISCONTD: cefTAZidime (FORTAZ)  IV  2 g Intravenous Q24H  . DISCONTD: cefTAZidime (FORTAZ)  IV  2 g Intravenous Q12H  .  DISCONTD: vancomycin  750 mg Intravenous Q24H   Continuous Infusions:    . sodium chloride 10 mL/hr at 08/31/11 0727   PRN Meds:.acetaminophen, acetaminophen, albuterol, ALPRAZolam, alum & mag hydroxide-simeth, loperamide, ondansetron (ZOFRAN) IV, ondansetron, traMADol-acetaminophen  Assessment/Plan: Consultants:  Dr. Yancey Flemings, Gastroenterology, signed off. Dental surgery - Dr. Kristin Bruins Dr. Tyson Alias, pulmonary critical care, signed off. Physical therapy Speech therapy Antibiotics:  Rocephin 08/18/11--->08/19/11.  Flagyl 08/18/11--->08/19/11  Zosyn 08/18/11--->08/18/11, Restarted 08/20/11----> 08/25/2011 Cipro 08/18/11--->08/18/11  Vancomycin 08/22/11--->08/25/2011, Restarted 08/28/2011 --->4/25 Augmentin 08/25/2011--->08/28/2011 Ceftazidime 08/28/2011---> Clindamycin 08/28/2011--->  Acute Respiratory Failure with hypoxia / Presumed healthcare associated pneumonia versus aspiration pneumonia Portable chest x-ray shows findings compatible with worsening right lower lobe pneumonia. Continue care in step down. Antibiotics changed to clindamycin, Ceftazidime, and vancomycin on 08/28/2011. Wean off O2 as tolerated. Appreciate palliative care input, for now continue medical management. Define 8 day course of antibiotics from 08/28/2011 to treat for aspiration/healthcare associated pneumonia, DC vancomycin today, cultures negative Decrease oxygen requirements as tolerated. KVO IVF  Abdominal distention / Diarrhea Etiology unclear.  Patient had abdominal x-ray on 08/28/2011 that showed similar to minimal improvement in diffuse large and small bowel dilatation, favor adynamic ileus. Tolerating a diet Interms of diarrhea, C. difficile PCR  negative on 08/29/2011, 08/24/2011 and 08/19/2011, likely antibiotic associated, add imodium PRN  Dysphagia Evaluated by speech therapy, suspect patient has chronic episodic aspiration. Knowing the patient has aspiration risk, patient started on dysphagia 3 diet with thin liquids.  AKI (acute kidney injury) / CKD (chronic kidney disease) stage 3, GFR 30-59 ml/min  Acute renal failure likely secondary to sepsis and dieuresis.  Renal function worsened on 08/28/2011 likely due to diuresis from Lasix.  Slowly continues to improve. Initially IVF stopped due to concerns for pulmonary edema, resumed 08/22/11.  Patient's saline lock on 08/25/2011.  Continue to hold hyzaar. Cut down IVF  Diarrhea / Sepsis / Hypotension / Leukocytosis / Metabolic acidosis, normal anion gap (NAG) Initially, had Index of suspicion initially high for C. difficile colitis given recent antibiotic use and high white blood cell count, but C. difficile PCR studies were negative. Stool cultures on 08/20/2011 was negative.  Metabolic acidosis likely from sepsis and bicarbonate losses in the stool which resolved with bicarbonate replacement therapy.  Initially treated empirically with Flagyl until C. difficile studies came back negative and Flagyl was discontinued.  Was also on initially on empiric Rocephin for the possibility of other sources of infection including the lungs and the urinary tract. Unfortunately, urine cultures not checked at the time of admission. Urine cultures sent on 08/20/11, but were negative, as expected.  CT scan of abdomen showed possible gallbladder inflammation as well as perinephric stranding. Abdominal ultrasound not consistent with cholecystitis so source of infection felt to be pyelonephritis.  Antibiotics broadened to Zosyn on 08/20/11 to cover pneumonia, pyelonephritis and cholecystitis, which since then has been discontinued. Blood cultures negative to date.  GI consult for help with management: HIDA  scan ordered - results with no significant findings; GI signed off 08/24/2011  MRI lumbar spine done on 08/22/2011 to rule out diskitis, given h/o back pain and lumbar laminectomy, which was negative.  Orthopantogram performed on 08/23/2011 showed dental abscess and dental surgery consulted. Probiotics added for/16/2013 per wife's request.  Suspect patient's sepsis may have been from dental abscess.  Suspect patient's diarrhea may have been from antibiotic use.  Dental Abscess  Appreciate input from Dr. Kristin Bruins, Orthopantogram performed on 08/23/2011 showed dental abscess. Patient has been scheduled for scheduled for  surgery on 08/29/2011 for extraction of remaining teeth with alveoloplasty and pre-prosthetic surgery.  However the surgery may need to be rescheduled given patient's declining respiratory status and possible ileus. Patient will need followup with a dentist of his choice for fabrication of upper and lower complete dentures as outpatient once the patient has surgery for extraction of his teeth. Initially transition the patient from Zosyn and vancomycin to Augmentin, however given the decline transition antibiotics to vancomycin, clindamycin and Fortaz.  Antibiotics since 08/18/2011.  COPD / Dyspnea / Hypoxia  Oxygen saturations maintained above 90%, continue nebulized bronchodilator therapy as needed.  Continue Spiriva. Increased chest congestion noted 08/20/11 felt to be secondary to pulmonary edema, pro-BNP was elevated. Responded to Lasix given 08/20/11. On chest x-ray patient per nursing had increased wheezing, chest x-ray on 08/27/2011 showed perihilar bronchiectatic changes, increased density in the right lower lobe with questionable developing infiltrate.  Patient was redosed with Lasix on 08/27/2011.   Wean O2  ANEMIA OF CHRONIC DISEASE  Normocytic anemia, likely of chronic disease and due to chronic kidney disease.  Hemoglobin stable.  On Aranesp as an outpatient.  History of  coronary artery disease/myocardial infarction/Elevated CK-MB  Troponins were negative x 3. Continue aspirin. 2-D echocardiogram on 08/18/2011 showed ejection fraction of 55-60%.   History of Atrial fibrillation / nonsustained ventricular tachycardia  Patient went into A. fib today, has been severely hypokalemic with sepsis from healthcare associated pneumonia.  May convert to sinus rhythm once acute medical issues are resolved. One episode of nonsustained ventricular tachycardia, likely exacerbated by hypokalemia. Repleting potassium. Patient not a good candidate for anticoagulation at this time, assess for need for anticoagulation prior to discharge.    PERIPHERAL VASCULAR DISEASE  Continue aspirin.  SECONDARY HYPERPARATHYROIDISM  Continue calcitriol.  Alzheimer's disease  Appears stable.  Abnormal LFTs  Likely secondary to sepsis, improving.  Transaminases now normal and alkaline phosphatase still abnormal.  Abdominal ultrasound to evaluate gallbladder/biliary tree performed on 08/19/2011. No significant evidence of cholecystitis.  Statin on hold consider resuming statin at discharge.  Elevated brain natriuretic peptide (BNP) level / chronic diastolic CHF  Initially, chest x-ray clear with no evidence of pulmonary edema. Suspect initial elevation of pro-BNP secondary to decreased renal clearance and chronic diastolic dysfunction.  Repeat two-dimensional echocardiogram showed normal LV function, grade I diastolic dysfunction.  Lasix 40 mg IV x1 given on 08/20/2011 for increased chest congestion and was given another dose of IV Lasix on 08/27/2011.   Hyponatremia  Likely secondary to dehydration from GI losses, resolved with gentle IV fluids.  Hypokalemia  Secondary to GI losses, repleting orally and IV. Continue replace as needed.  Left renal mass  Consistent with a complex cyst on ultrasound on 08/19/1998 team.  Will need further evaluation as outpatient, with followup imaging in  3 months.  Enlarged prostate  No complaints of urinary hesitancy.  Right bundle branch block  Chronic.  Thrombocytopenia Chronic.  Continue to monitor.  Prophylaxis SQ Heparin   Code Status: DNR/DNI, wants continued medical management.  Family Communication: Firas, [place] [phone]. Disposition Plan: transfer to tele, PT/OT eval D/w daughter Tamra yesterday   LOS: 15 days  Zannie Cove, MD 09/01/2011, 7:59 AM

## 2011-09-01 NOTE — Progress Notes (Signed)
Nutrition Follow-up  Diet Order: Dysphagia 3  Patient reported his appetite is not improving much. Per RN patient only ate grits this morning. PO intake documented 10-75% at meals. Patient agreed to try Ensure pudding and voiced snack preferences.   Meds: Scheduled Meds:   . antiseptic oral rinse  15 mL Mouth Rinse BID  . aspirin EC  81 mg Oral Daily  . calcitRIOL  0.25 mcg Oral Daily  . cefTAZidime (FORTAZ)  IV  2 g Intravenous Q12H  . citalopram  20 mg Oral Daily  . clindamycin (CLEOCIN) IV  600 mg Intravenous Q8H  . famotidine  20 mg Oral Daily  . Flora-Q  1 capsule Oral Daily  . heparin subcutaneous  5,000 Units Subcutaneous Q8H  . potassium chloride  40 mEq Per Tube BID  . potassium chloride  40 mEq Oral Daily  . potassium chloride  40 mEq Oral NOW  . sodium chloride  3 mL Intravenous Q12H  . tiotropium  18 mcg Inhalation Daily  . DISCONTD: vancomycin  750 mg Intravenous Q24H   Continuous Infusions:   . sodium chloride 10 mL/hr at 08/31/11 0727   PRN Meds:.acetaminophen, acetaminophen, albuterol, ALPRAZolam, alum & mag hydroxide-simeth, loperamide, ondansetron (ZOFRAN) IV, ondansetron, traMADol-acetaminophen  Labs:  CMP     Component Value Date/Time   NA 144 09/01/2011 0326   K 3.3* 09/01/2011 1152   CL 116* 09/01/2011 0326   CO2 16* 09/01/2011 0326   GLUCOSE 98 09/01/2011 0326   BUN 26* 09/01/2011 0326   CREATININE 1.61* 09/01/2011 0326   CALCIUM 8.8 09/01/2011 0326   CALCIUM 9.2 07/18/2011 0947   PROT 5.9* 08/29/2011 0331   ALBUMIN 2.1* 08/29/2011 0331   AST 23 08/29/2011 0331   ALT 26 08/29/2011 0331   ALKPHOS 143* 08/29/2011 0331   BILITOT 0.3 08/29/2011 0331   GFRNONAA 38* 09/01/2011 0326   GFRAA 44* 09/01/2011 0326     Intake/Output Summary (Last 24 hours) at 09/01/11 1329 Last data filed at 09/01/11 1300  Gross per 24 hour  Intake 1598.33 ml  Output   2025 ml  Net -426.67 ml    Weight Status:  144 lb.  4/11 149 lb 11.1 oz  4/15 143 lb  -Changes in weight  likely fluid related  Wt Readings from Last 3 Encounters:  08/31/11 144 lb 2.9 oz (65.4 kg)  08/31/11 144 lb 2.9 oz (65.4 kg)  07/18/11 155 lb 3.2 oz (70.398 kg)   Estimated needs:   2031-2370 calories  75-95g protein   Nutrition Dx:  Inadequate oral intake - slowly improving  Goal:  PO intake >75% of meals - not met consistently  Intervention:  1. Will order Ensure pudding once daily to promote increased calorie intake. 2. Will order patient snacks BID to promote increased caloric intake.  Monitor: Weight trends, labs, PO intake   Adron Bene Pager #:  [phone]

## 2011-09-01 NOTE — Progress Notes (Signed)
CSW continues to follow for possible SNF and support due to lengthy hospitalization. CSW spoke with daughter, [name] who had many questions re. Plans for her Dad. Many of these questions were medical and family could benefit from update to assist with dispo. [name] stated they were "still not sure he will make it out of the hospital". Family had been considering SNF prior to stay in ICU and have bed at [facility] if that is the appropriate plan. Per [name], they do not have someone at home that can assist 24/7 and Pt's wife would have issues caring for him at home. CSW explained to [name] some of the options available and made her aware that around the clock care at home would have to be paid out of pocket. CSW called [facility] and updated them. CSW will continue to follow as dispo becomes more clear. [name], [place] 09/01/2011 9:53 AM [phone]

## 2011-09-01 NOTE — Plan of Care (Signed)
Problem: Inadequate Intake (NI-2.1) Goal: Food and/or nutrient delivery Individualized approach for food/nutrient provision.  Outcome: Progressing Patient agreed to try ensure pudding and voiced snack preferences.

## 2011-09-01 NOTE — Progress Notes (Signed)
Physical Therapy Treatment Patient Details Name: Brandon [name] MRN: [id number] DOB: 16-Dec-1925 Today's Date: 09/01/2011 Time: 1400-1430 PT Time Calculation (min): 30 min 1gt, 1ta  PT Assessment / Plan / Recommendation Comments on Treatment Session  Pt on Max 3L O2.  O2 sats 96% supine in bed.  Pt +2 total assist pt=50% for bed mobility. Pt. +3 total assist for equipment (O2, IV, Chair) for ambulation. Pt ambulated +2 total assist pt=80% 15 ft with RW, O2 sats 85-94% on 3L. Pt c/o pain sitting up due to ileus. Pt's abdomen was very distended.    Follow Up Recommendations  Skilled nursing facility    Equipment Recommendations  Defer to next venue    Frequency     Plan Discharge plan remains appropriate    Precautions / Restrictions     Pertinent Vitals/Pain 144/69 (87), HR91, O2 sats 96% supine [name] 3L    Mobility  Bed Mobility Bed Mobility: Supine to Sit Supine to Sit: 1: +2 Total assist Supine to Sit: Patient Percentage: 50% Transfers Transfers: Sit to Stand;Stand to Sit Sit to Stand: 1: +2 Total assist Sit to Stand: Patient Percentage: 70% Stand to Sit: 1: +2 Total assist;To chair/3-in-1 Stand to Sit: Patient Percentage: 70% Details for Transfer Assistance: VC needed for hand placement; pt fatigued quickly Ambulation/Gait Ambulation/Gait Assistance: 1: +2 Total assist Ambulation/Gait: Patient Percentage: 80 Ambulation Distance (Feet): 15 Feet Assistive device: Rolling walker Ambulation/Gait Assistance Details: pt on  on 3L; O2 sats 85-94 % Gait Pattern: Step-to pattern;Decreased step length - left;Decreased step length - right;Trunk flexed;Shuffle Stairs: No Corporate treasurer: No    Exercises     PT Goals Acute Rehab PT Goals PT Goal Formulation: With patient Pt will go Supine/Side to Sit: Independently PT Goal: Supine/Side to Sit - Progress: Progressing toward goal Pt will go Sit to Stand: with supervision PT Goal: Sit to Stand -  Progress: Progressing toward goal Pt will go Stand to Sit: with supervision PT Goal: Stand to Sit - Progress: Progressing toward goal Pt will Ambulate: 51 - 150 feet;with min assist;with least restrictive assistive device PT Goal: Ambulate - Progress: Progressing toward goal  Visit Information  Last PT Received On: 09/01/11 Assistance Needed: +3 or more (for equipment (IV, O2, chair))    Subjective Data  Subjective: pt sleeping in bed with 2 daughters present; pt stated "good luck with that" when PT stated we were there to get him up to ambulate         End of Session PT - End of Session Equipment Utilized During Treatment: Gait belt Activity Tolerance: Treatment limited secondary to medical complications (Comment) (dyspnea) Patient left: in chair;with call bell/phone within reach;with family/visitor present    Brandon [name], SPTA  09/01/2011, 2:56 PM  Brandon [name]  PTA [facility]  Acute  Rehab Pager     [phone]

## 2011-09-02 LAB — CBC
Hemoglobin: 9.4 g/dL — ABNORMAL LOW (ref 13.0–17.0)
RBC: 3.15 MIL/uL — ABNORMAL LOW (ref 4.22–5.81)
WBC: 7.8 10*3/uL (ref 4.0–10.5)

## 2011-09-02 LAB — BASIC METABOLIC PANEL
CO2: 16 mEq/L — ABNORMAL LOW (ref 19–32)
GFR calc non Af Amer: 38 mL/min — ABNORMAL LOW (ref >90)
Glucose, Bld: 103 mg/dL — ABNORMAL HIGH (ref 70–99)
Potassium: 2.8 mEq/L — ABNORMAL LOW (ref 3.5–5.1)
Sodium: 142 mEq/L (ref 135–145)

## 2011-09-02 MED ORDER — POTASSIUM CHLORIDE 10 MEQ/100ML IV SOLN
10.0000 meq | INTRAVENOUS | Status: AC
  Administered 2011-09-02 (×4): 10 meq via INTRAVENOUS
  Filled 2011-09-02 (×4): qty 100

## 2011-09-02 MED ORDER — LEVOFLOXACIN 500 MG PO TABS
500.0000 mg | ORAL_TABLET | Freq: Once | ORAL | Status: AC
  Administered 2011-09-02: 500 mg via ORAL
  Filled 2011-09-02: qty 1

## 2011-09-02 MED ORDER — MORPHINE SULFATE (CONCENTRATE) 20 MG/ML PO SOLN
2.5000 mg | ORAL | Status: DC | PRN
  Administered 2011-09-03 – 2011-09-04 (×3): 2.6 mg via ORAL
  Filled 2011-09-02 (×4): qty 1

## 2011-09-02 MED ORDER — LEVOFLOXACIN 500 MG PO TABS
500.0000 mg | ORAL_TABLET | Freq: Every day | ORAL | Status: DC
  Filled 2011-09-02: qty 1

## 2011-09-02 MED ORDER — LEVOFLOXACIN 250 MG PO TABS
250.0000 mg | ORAL_TABLET | Freq: Every day | ORAL | Status: DC
  Administered 2011-09-03 – 2011-09-06 (×4): 250 mg via ORAL
  Filled 2011-09-02 (×4): qty 1

## 2011-09-02 NOTE — Progress Notes (Signed)
Interim progress note: Brandon [name] is an 78 year old man with a PMH of chronic renal failure, hypertension, Alzheimer's disease, COPD, atrial fibrillation, recent treatment with antibiotics for bronchitis who was brought to the ER on 08/18/11 with the acute onset of diarrhea and dyspnea. Upon initial evaluation, he was found to be hypotensive with marked leukocytosis. The source of his sepsis has not been entirely clear and he initially improved in the first 24 hours but deteriorated when his antibiotics were narrowed.  He had orthopantogram performed on 08/23/2011 showed dental abscess which was presumed to be the likely source for his sepsis.  Initially the plan had been for the patient to have surgery on 08/29/2011 for extraction of his remaining teeth.  Patient given his improvement and was eventually transitioned to a non-telemetry floor.  However on the morning of 08/28/2011 patient had acute hypoxic respiratory failure for imaging showed infiltrates in the right base and patient was placed on 100% nonrebreather mask.  He was transitioned back to step down.  Had goals of care discussion with the patient and family on 08/28/2011 who at that time indicated that he wished to be DO NOT RESUSCITATE/DO NOT INTUBATE but wanted continue medical management.  Patient since then has improved on antibiotics.  Subjective: No events overnight, now on 3L [place], eating some, still having diarrhea but better  Objective: Vital signs in last 24 hours: Filed Vitals:   09/01/11 1849 09/01/11 2329 09/02/11 0459 09/02/11 0908  BP: 118/70 165/74 178/86   Pulse: 75 77 83   Temp: 98.3 F (36.8 C) 97.4 F (36.3 C) 98.4 F (36.9 C)   TempSrc: Oral Oral Axillary   Resp: 28 24 24    Height:      Weight:   64.1 kg (141 lb 5 oz)   SpO2: 97% 99% 97% 97%   Weight change:   Intake/Output Summary (Last 24 hours) at 09/02/11 1023 Last data filed at 09/02/11 0823  Gross per 24 hour  Intake   1290 ml  Output   1110 ml  Net     180 ml    Physical Exam: General: Awake, Oriented, No acute distress. HEENT: EOMI, nasal cannula in place. Neck: Supple CV: S1 and S2 Lungs: Coarse breath sounds bilaterally, conducted upper airway sounds Abdomen: Soft, Nontender, distended, high pitched bowel sounds. Ext: Good pulses. Trace edema.  Lab Results:  Basename 09/02/11 0508 09/01/11 1152 09/01/11 0326 08/31/11 0340  NA 142 -- 144 --  K 2.8* 3.3* -- --  CL 115* -- 116* --  CO2 16* -- 16* --  GLUCOSE 103* -- 98 --  BUN 24* -- 26* --  CREATININE 1.59* -- 1.61* --  CALCIUM 8.8 -- 8.8 --  MG -- -- -- 1.8  PHOS -- -- -- --   No results found for this basename: AST:2,ALT:2,ALKPHOS:2,BILITOT:2,PROT:2,ALBUMIN:2 in the last 72 hours No results found for this basename: LIPASE:2,AMYLASE:2 in the last 72 hours  Basename 09/02/11 0508 09/01/11 0326  WBC 7.8 6.6  NEUTROABS -- --  HGB 9.4* 9.4*  HCT 29.2* 29.7*  MCV 92.7 92.2  PLT 129* 121*   No results found for this basename: CKTOTAL:3,CKMB:3,CKMBINDEX:3,TROPONINI:3 in the last 72 hours No components found with this basename: POCBNP:3 No results found for this basename: DDIMER:2 in the last 72 hours No results found for this basename: HGBA1C:2 in the last 72 hours No results found for this basename: CHOL:2,HDL:2,LDLCALC:2,TRIG:2,CHOLHDL:2,LDLDIRECT:2 in the last 72 hours No results found for this basename: TSH,T4TOTAL,FREET3,T3FREE,THYROIDAB in the last  72 hours No results found for this basename: VITAMINB12:2,FOLATE:2,FERRITIN:2,TIBC:2,IRON:2,RETICCTPCT:2 in the last 72 hours  Micro Results: Recent Results (from the past 240 hour(s))  CLOSTRIDIUM DIFFICILE BY PCR     Status: Normal   Collection Time   08/24/11  8:32 PM      Component Value Range Status Comment   C difficile by pcr NEGATIVE  NEGATIVE  Final   CULTURE, BLOOD (ROUTINE X 2)     Status: Normal (Preliminary result)   Collection Time   08/28/11  8:15 AM      Component Value Range Status Comment    Specimen Description BLOOD RIGHT ARM   Final    Special Requests     Final    Value: BOTTLES DRAWN AEROBIC AND ANAEROBIC 4CC BOTH BOTTLES   Culture  Setup Time [id number]   Final    Culture     Final    Value:        BLOOD CULTURE RECEIVED NO GROWTH TO DATE CULTURE WILL BE HELD FOR 5 DAYS BEFORE ISSUING A FINAL NEGATIVE REPORT   Report Status PENDING   Incomplete   CULTURE, BLOOD (ROUTINE X 2)     Status: Normal (Preliminary result)   Collection Time   08/28/11  8:30 AM      Component Value Range Status Comment   Specimen Description BLOOD RIGHT HAND   Final    Special Requests     Final    Value: BOTTLES DRAWN AEROBIC AND ANAEROBIC 5CC BOTH BOTTLES   Culture  Setup Time [id number]   Final    Culture     Final    Value:        BLOOD CULTURE RECEIVED NO GROWTH TO DATE CULTURE WILL BE HELD FOR 5 DAYS BEFORE ISSUING A FINAL NEGATIVE REPORT   Report Status PENDING   Incomplete   CULTURE, RESPIRATORY     Status: Normal   Collection Time   08/28/11 11:45 AM      Component Value Range Status Comment   Specimen Description SPUTUM   Final    Special Requests NONE   Final    Gram Stain     Final    Value: NO WBC SEEN     RARE SQUAMOUS EPITHELIAL CELLS PRESENT     NO ORGANISMS SEEN   Culture FEW CANDIDA ALBICANS   Final    Report Status 08/31/2011 FINAL   Final   CLOSTRIDIUM DIFFICILE BY PCR     Status: Normal   Collection Time   08/29/11  7:59 AM      Component Value Range Status Comment   C difficile by pcr NEGATIVE  NEGATIVE  Final     Studies/Results: Dg Chest 2 View  09/01/2011  *RADIOLOGY REPORT*  Clinical Data: Follow-up pneumonia.  Hypoxia.  Hypertension with COPD and shortness of breath  CHEST - 2 VIEW  Comparison: 08/29/2011  Findings: Changes of underlying COPD are again evident.  There is persistent infiltrate identified in the right lower lobe with some interval improvement since the previous exam. Crowding of the bronchovascular markings at the left lung base due to mild  elevation of the left hemidiaphragm is stable.  Coarse diffuse interstitial markings correlate with underlying bronchitic change and central peribronchial cuffing is stable.  Ectasia of the thoracic aorta with calcification in the aortic root extending into the descending portion of the thoracic aorta is noted.  No associated pleural effusion or new areas of atelectasis or infiltrate are seen.  IMPRESSION: Persistent  but improving right lower lobe pneumonia with underlying chronic changes.  Original Report Authenticated By: Bertha Stakes, M.D.    Medications: I have reviewed the patient's current medications. Scheduled Meds:    . antiseptic oral rinse  15 mL Mouth Rinse BID  . aspirin EC  81 mg Oral Daily  . calcitRIOL  0.25 mcg Oral Daily  . cefTAZidime (FORTAZ)  IV  2 g Intravenous Q12H  . citalopram  20 mg Oral Daily  . clindamycin (CLEOCIN) IV  600 mg Intravenous Q8H  . famotidine  20 mg Oral Daily  . feeding supplement  1 Container Oral QAC supper  . Flora-Q  1 capsule Oral Daily  . heparin subcutaneous  5,000 Units Subcutaneous Q8H  . potassium chloride  10 mEq Intravenous Q1 Hr x 4  . potassium chloride  40 mEq Per Tube BID  . potassium chloride  40 mEq Oral Daily  . sodium chloride  3 mL Intravenous Q12H  . tiotropium  18 mcg Inhalation Daily   Continuous Infusions:   PRN Meds:.acetaminophen, acetaminophen, albuterol, ALPRAZolam, alum & mag hydroxide-simeth, loperamide, ondansetron (ZOFRAN) IV, ondansetron, traMADol-acetaminophen  Assessment/Plan: Consultants:  Dr. Yancey Flemings, Gastroenterology, signed off. Dental surgery - Dr. Kristin Bruins Dr. Tyson Alias, pulmonary critical care, signed off. Physical therapy Speech therapy Antibiotics:  Rocephin 08/18/11--->08/19/11.  Flagyl 08/18/11--->08/19/11  Zosyn 08/18/11--->08/18/11, Restarted 08/20/11----> 08/25/2011 Cipro 08/18/11--->08/18/11  Vancomycin 08/22/11--->08/25/2011, Restarted 08/28/2011 --->4/25 Augmentin  08/25/2011--->08/28/2011 Ceftazidime 08/28/2011--->4/26 Clindamycin 08/28/2011--->4/26  Acute Respiratory Failure with hypoxia / Presumed healthcare associated pneumonia versus aspiration pneumonia Most recent Xray with improving pneumonia Antibiotics changed to clindamycin, Ceftazidime, and vancomycin on 08/28/2011. Wean off O2 as tolerated. Appreciate palliative care input, for now continue medical management. Define 8 day course of antibiotics from 08/28/2011 to treat for aspiration/healthcare associated pneumonia, DC vancomycin 4/25,DC fortaz/clinda,  transition to levaquin today for 3 more days, cultures negative Decrease oxygen requirements as tolerated. KVO IVF  Abdominal distention / Diarrhea Etiology unclear.  Patient had abdominal x-ray on 08/28/2011 that showed similar to minimal improvement in diffuse large and small bowel dilatation, favor adynamic ileus. Tolerating a diet Interms of diarrhea, C. difficile PCR negative on 08/29/2011, 08/24/2011 and 08/19/2011, likely antibiotic associated, add imodium PRN Check KuB in am  Dysphagia Evaluated by speech therapy, suspect patient has chronic episodic aspiration. Knowing the patient has aspiration risk, patient started on dysphagia 3 diet with thin liquids.  AKI (acute kidney injury) / CKD (chronic kidney disease) stage 3, GFR 30-59 ml/min  Acute renal failure likely secondary to sepsis and dieuresis.  Renal function worsened on 08/28/2011 likely due to diuresis from Lasix.  Slowly continues to improve. Initially IVF stopped due to concerns for pulmonary edema, resumed 08/22/11.  Patient's saline lock on 08/25/2011.  Continue to hold hyzaar. Stopped IVF  Diarrhea / Sepsis / Hypotension / Leukocytosis / Metabolic acidosis, normal anion gap (NAG) Initially, had Index of suspicion initially high for C. difficile colitis given recent antibiotic use and high white blood cell count, but C. difficile PCR studies were negative. Stool cultures  on 08/20/2011 was negative.  Metabolic acidosis likely from sepsis and bicarbonate losses in the stool which resolved with bicarbonate replacement therapy.  Initially treated empirically with Flagyl until C. difficile studies came back negative and Flagyl was discontinued.  Was also on initially on empiric Rocephin for the possibility of other sources of infection including the lungs and the urinary tract. Unfortunately, urine cultures not checked at the time of admission. Urine cultures sent on 08/20/11, but  were negative, as expected.  CT scan of abdomen showed possible gallbladder inflammation as well as perinephric stranding. Abdominal ultrasound not consistent with cholecystitis so source of infection felt to be pyelonephritis.  Antibiotics broadened to Zosyn on 08/20/11 to cover pneumonia, pyelonephritis and cholecystitis, which since then has been discontinued. Blood cultures negative to date.  GI consult for help with management: HIDA scan ordered - results with no significant findings; GI signed off 08/24/2011  MRI lumbar spine done on 08/22/2011 to rule out diskitis, given h/o back pain and lumbar laminectomy, which was negative.  Orthopantogram performed on 08/23/2011 showed dental abscess and dental surgery consulted. Suspect patient's sepsis may have been from dental abscess.  Suspect patient's diarrhea may have been from antibiotic use.  Dental Abscess  Appreciate input from Dr. Kristin Bruins, Orthopantogram performed on 08/23/2011 showed dental abscess. Patient has been scheduled for scheduled for surgery on 08/29/2011 for extraction of remaining teeth with alveoloplasty and pre-prosthetic surgery.  However the surgery had  to be rescheduled given patient's declining respiratory status and possible ileus. Patient will need followup with a dentist as outpatient. Completed prolong 6day course of Clinda/Fortaz  COPD / Dyspnea / Hypoxia  Wean O2  ANEMIA OF CHRONIC DISEASE  Normocytic anemia,  likely of chronic disease and due to chronic kidney disease.  Hemoglobin stable.  On Aranesp as an outpatient.  History of coronary artery disease/myocardial infarction/Elevated CK-MB  Troponins were negative x 3. Continue aspirin. 2-D echocardiogram on 08/18/2011 showed ejection fraction of 55-60%.   History of Atrial fibrillation / nonsustained ventricular tachycardia  Patient went into A. fib intermittently,  has been severely hypokalemic with sepsis from healthcare associated pneumonia.  May convert to sinus rhythm once acute medical issues are resolved. One episode of nonsustained ventricular tachycardia, likely exacerbated by hypokalemia. Repleting potassium. Patient not a good candidate for anticoagulation at this time  PERIPHERAL VASCULAR DISEASE  Continue aspirin.  SECONDARY HYPERPARATHYROIDISM  Continue calcitriol.  Alzheimer's disease  Appears stable.  Abnormal LFTs  Likely secondary to sepsis, improving.  Transaminases now normal and alkaline phosphatase still abnormal.  Abdominal ultrasound to evaluate gallbladder/biliary tree performed on 08/19/2011. No significant evidence of cholecystitis.  Statin on hold consider resuming statin at discharge.  Elevated brain natriuretic peptide (BNP) level / chronic diastolic CHF  Initially, chest x-ray clear with no evidence of pulmonary edema. Suspect initial elevation of pro-BNP secondary to decreased renal clearance and chronic diastolic dysfunction.  Repeat two-dimensional echocardiogram showed normal LV function, grade I diastolic dysfunction.  Lasix 40 mg IV x1 given on 08/20/2011 for increased chest congestion and was given another dose of IV Lasix on 08/27/2011.   Hyponatremia  Likely secondary to dehydration from GI losses, resolved with gentle IV fluids.  Hypokalemia  Secondary to GI losses, repleting orally and IV. Continue replace as needed.  Left renal mass  Consistent with a complex cyst on ultrasound on  08/19/1998 team.  Will need further evaluation as outpatient, with followup imaging in 3 months.  Enlarged prostate  No complaints of urinary hesitancy.  Right bundle branch block  Chronic.  Thrombocytopenia Chronic.  Continue to monitor.  Prophylaxis SQ Heparin   Code Status: DNR/DNI, wants continued medical management.  Family Communication: Khalon, [place]ans[place] [phone]. Disposition Plan:SNF transfer to tele, PT/OT eval D/w all 3 daughters today   LOS: 16 days  Zannie Cove, MD 09/02/2011, 10:23 AM

## 2011-09-02 NOTE — Progress Notes (Signed)
Pt in and out of A-fib.  Pt has hx of A-fib.  Per ICU RN that gave report to day RN on 4th floor, MD aware that pt flips between A-fib and SR at times.  Therefore, did not call MD regarding this.

## 2011-09-02 NOTE — Progress Notes (Signed)
Pt had 3 beats of V. Tach.  Pt asymptomatic and sleeping when assessed.  Vital signs taken BP elevated at 178/86.  Notified NP on call, K. Schorr. No new orders received.  Will continue to monitor pt.

## 2011-09-02 NOTE — Progress Notes (Signed)
SLP Cancellation Note  Treatment cancelled today due to need for clarification of Goals of Care.  Palliative care meeting with family planned for this afternoon.  Patient with s/s of aspiration with purees per primary SLP.  Question if family wishes to continue p.o.'s with known risks and d/c SLP.  Will f/u next day.  Maryjo Rochester T 09/02/2011, 12:25 PM

## 2011-09-02 NOTE — Progress Notes (Signed)
Subjective: Patient seen and examined, discussed the goals of care with family , and later confirmed those goals with patient.  Objective: Vital signs in last 24 hours: Temp:  [97.4 F (36.3 C)-98.6 F (37 C)] 98.6 F (37 C) (04/26 1415) Pulse Rate:  [74-83] 74  (04/26 1415) Resp:  [22-28] 22  (04/26 1415) BP: (118-178)/(70-86) 145/74 mmHg (04/26 1415) SpO2:  [97 %-99 %] 97 % (04/26 1415) Weight:  [64.1 kg (141 lb 5 oz)] 64.1 kg (141 lb 5 oz) (04/26 0459) Weight change:  Last BM Date: 09/02/11 (flexiseal)  Intake/Output from previous day: 04/25 0701 - 04/26 0700 In: 1270 [P.O.:1020; IV Piggyback:250] Out: 810 [Urine:610; Stool:200] Total I/O In: 480 [P.O.:480] Out: 300 [Urine:300]   Physical Exam: HEENT: Atraumatic, normocephalic Neck: Supple Chest : Clear to auscultation bilaterally Heart : S1 S2 Regular, no murmurs Abdomen: Soft, Nontender, no organomegaly Ext : No cyanosis, clubbing, edema   Lab Results: Basic Metabolic Panel:  Basename 09/02/11 0508 09/01/11 1152 09/01/11 0326 08/31/11 0340  NA 142 -- 144 --  K 2.8* 3.3* -- --  CL 115* -- 116* --  CO2 16* -- 16* --  GLUCOSE 103* -- 98 --  BUN 24* -- 26* --  CREATININE 1.59* -- 1.61* --  CALCIUM 8.8 -- 8.8 --  MG -- -- -- 1.8  PHOS -- -- -- --   Liver Function Tests: No results found for this basename: AST:2,ALT:2,ALKPHOS:2,BILITOT:2,PROT:2,ALBUMIN:2 in the last 72 hours No results found for this basename: LIPASE:2,AMYLASE:2 in the last 72 hours No results found for this basename: AMMONIA:2 in the last 72 hours CBC:  Basename 09/02/11 0508 09/01/11 0326  WBC 7.8 6.6  NEUTROABS -- --  HGB 9.4* 9.4*  HCT 29.2* 29.7*  MCV 92.7 92.2  PLT 129* 121*    No results found for this basename: labopia, cocainscrnur, labbenz, amphetmu, thcu, labbarb    Alcohol Level: No results found for this basename: ETH:2 in the last 72 hours Urinalysis: No results found for this basename:  COLORURINE:2,APPERANCEUR:2,LABSPEC:2,PHURINE:2,GLUCOSEU:2,HGBUR:2,BILIRUBINUR:2,KETONESUR:2,PROTEINUR:2,UROBILINOGEN:2,NITRITE:2,LEUKOCYTESUR:2 in the last 72 hours Misc. Labs:  Recent Results (from the past 240 hour(s))  CLOSTRIDIUM DIFFICILE BY PCR     Status: Normal   Collection Time   08/24/11  8:32 PM      Component Value Range Status Comment   C difficile by pcr NEGATIVE  NEGATIVE  Final   CULTURE, BLOOD (ROUTINE X 2)     Status: Normal (Preliminary result)   Collection Time   08/28/11  8:15 AM      Component Value Range Status Comment   Specimen Description BLOOD RIGHT ARM   Final    Special Requests     Final    Value: BOTTLES DRAWN AEROBIC AND ANAEROBIC 4CC BOTH BOTTLES   Culture  Setup Time [id number]   Final    Culture     Final    Value:        BLOOD CULTURE RECEIVED NO GROWTH TO DATE CULTURE WILL BE HELD FOR 5 DAYS BEFORE ISSUING A FINAL NEGATIVE REPORT   Report Status PENDING   Incomplete   CULTURE, BLOOD (ROUTINE X 2)     Status: Normal (Preliminary result)   Collection Time   08/28/11  8:30 AM      Component Value Range Status Comment   Specimen Description BLOOD RIGHT HAND   Final    Special Requests     Final    Value: BOTTLES DRAWN AEROBIC AND ANAEROBIC 5CC BOTH BOTTLES   Culture  Setup Time [id number]   Final    Culture     Final    Value:        BLOOD CULTURE RECEIVED NO GROWTH TO DATE CULTURE WILL BE HELD FOR 5 DAYS BEFORE ISSUING A FINAL NEGATIVE REPORT   Report Status PENDING   Incomplete   CULTURE, RESPIRATORY     Status: Normal   Collection Time   08/28/11 11:45 AM      Component Value Range Status Comment   Specimen Description SPUTUM   Final    Special Requests NONE   Final    Gram Stain     Final    Value: NO WBC SEEN     RARE SQUAMOUS EPITHELIAL CELLS PRESENT     NO ORGANISMS SEEN   Culture FEW CANDIDA ALBICANS   Final    Report Status 08/31/2011 FINAL   Final   CLOSTRIDIUM DIFFICILE BY PCR     Status: Normal   Collection Time   08/29/11  7:59  AM      Component Value Range Status Comment   C difficile by pcr NEGATIVE  NEGATIVE  Final     Studies/Results: Dg Chest 2 View  09/01/2011  *RADIOLOGY REPORT*  Clinical Data: Follow-up pneumonia.  Hypoxia.  Hypertension with COPD and shortness of breath  CHEST - 2 VIEW  Comparison: 08/29/2011  Findings: Changes of underlying COPD are again evident.  There is persistent infiltrate identified in the right lower lobe with some interval improvement since the previous exam. Crowding of the bronchovascular markings at the left lung base due to mild elevation of the left hemidiaphragm is stable.  Coarse diffuse interstitial markings correlate with underlying bronchitic change and central peribronchial cuffing is stable.  Ectasia of the thoracic aorta with calcification in the aortic root extending into the descending portion of the thoracic aorta is noted.  No associated pleural effusion or new areas of atelectasis or infiltrate are seen.  IMPRESSION: Persistent but improving right lower lobe pneumonia with underlying chronic changes.  Original Report Authenticated By: Bertha Stakes, M.D.    Medications: Scheduled Meds:   . antiseptic oral rinse  15 mL Mouth Rinse BID  . aspirin EC  81 mg Oral Daily  . citalopram  20 mg Oral Daily  . famotidine  20 mg Oral Daily  . Flora-Q  1 capsule Oral Daily  . heparin subcutaneous  5,000 Units Subcutaneous Q8H  . levofloxacin  250 mg Oral Daily  . levofloxacin  500 mg Oral Once  . potassium chloride  10 mEq Intravenous Q1 Hr x 4  . potassium chloride  40 mEq Per Tube BID  . potassium chloride  40 mEq Oral Daily  . sodium chloride  3 mL Intravenous Q12H  . tiotropium  18 mcg Inhalation Daily  . DISCONTD: calcitRIOL  0.25 mcg Oral Daily  . DISCONTD: cefTAZidime (FORTAZ)  IV  2 g Intravenous Q12H  . DISCONTD: clindamycin (CLEOCIN) IV  600 mg Intravenous Q8H  . DISCONTD: feeding supplement  1 Container Oral QAC supper  . DISCONTD: levofloxacin  500 mg  Oral Daily   Continuous Infusions:  PRN Meds:.acetaminophen, acetaminophen, albuterol, ALPRAZolam, alum & mag hydroxide-simeth, loperamide, morphine, ondansetron (ZOFRAN) IV, ondansetron, traMADol-acetaminophen  Assessment/Plan:  Aspiration Pneumonia Diarrhea Dysphagia Acute on CKD Dental Abscess COPD Anemia of chronic disease H/o Atrial Fibrillation Alzheimer's Dementia  Discussed in detail with family, regarding patient's current medical condition and poor prognoses, due to underlying dysphagia and chances of recurrent aspiration. Family  does not want him to have  peg tube or other artificial nutrition. At this time family has decided to initiate some comfort measures but continue other medications. They do not want him to be returned to hospital so a MOST form should be filled out prior to discharge.  Recommendations: # Patient is DNR # Start Roxanol 2.5 mg po /sl q 2hr prn dyspnea # Start Comfort feeds, family understand the aspiration risks #Continue current medications including antibiotics # Continue work up for diarrhea/abd distension # Patient to be discharged to SNF with palliative care follow up # Fill out the MOST form before discharge, family wants to wait for few days before filling the form.   LOS: 16 days   [facility] Pager: [phone] 09/02/2011, 6:12 PM

## 2011-09-02 NOTE — Progress Notes (Signed)
Occupational Therapy Note Chart reviewed. Spoke with pt's wife and daughter who state pt is sleeping/resting and that MD advised letting pt rest currently.  OT to check back at a later time. Judithann Sauger OTR/L [phone] 09/02/2011

## 2011-09-03 ENCOUNTER — Inpatient Hospital Stay (HOSPITAL_COMMUNITY): Payer: Medicare Other

## 2011-09-03 LAB — CBC
Hemoglobin: 9.3 g/dL — ABNORMAL LOW (ref 13.0–17.0)
MCH: 28.6 pg (ref 26.0–34.0)
MCV: 92.9 fL (ref 78.0–100.0)
RBC: 3.25 MIL/uL — ABNORMAL LOW (ref 4.22–5.81)

## 2011-09-03 LAB — BASIC METABOLIC PANEL
CO2: 17 mEq/L — ABNORMAL LOW (ref 19–32)
Calcium: 8.5 mg/dL (ref 8.4–10.5)
Glucose, Bld: 98 mg/dL (ref 70–99)
Sodium: 142 mEq/L (ref 135–145)

## 2011-09-03 LAB — CULTURE, BLOOD (ROUTINE X 2)
Culture  Setup Time: 201304211420
Culture: NO GROWTH
Culture: NO GROWTH

## 2011-09-03 MED ORDER — POTASSIUM CHLORIDE 20 MEQ/15ML (10%) PO LIQD
40.0000 meq | Freq: Once | ORAL | Status: AC
  Administered 2011-09-03: 40 meq via ORAL
  Filled 2011-09-03: qty 30

## 2011-09-03 MED ORDER — VITAMINS A & D EX OINT
TOPICAL_OINTMENT | CUTANEOUS | Status: AC
  Administered 2011-09-03: 5
  Filled 2011-09-03: qty 5

## 2011-09-03 MED ORDER — POTASSIUM CHLORIDE 20 MEQ/15ML (10%) PO LIQD
40.0000 meq | Freq: Three times a day (TID) | ORAL | Status: DC
  Administered 2011-09-03 – 2011-09-06 (×9): 40 meq
  Filled 2011-09-03 (×12): qty 30

## 2011-09-03 NOTE — Progress Notes (Signed)
Interim progress note: Brandon [name] is an 78 year old man with a PMH of chronic renal failure, hypertension, Alzheimer's disease, COPD, atrial fibrillation, recent treatment with antibiotics for bronchitis who was brought to the ER on 08/18/11 with the acute onset of diarrhea and dyspnea. Upon initial evaluation, he was found to be hypotensive with marked leukocytosis. The source of his sepsis has not been entirely clear and he initially improved in the first 24 hours but deteriorated when his antibiotics were narrowed.  He had orthopantogram performed on 08/23/2011 showed dental abscess which was presumed to be the likely source for his sepsis.  Initially the plan had been for the patient to have surgery on 08/29/2011 for extraction of his remaining teeth.  Patient given his improvement and was eventually transitioned to a non-telemetry floor.  However on the morning of 08/28/2011 patient had acute hypoxic respiratory failure for imaging showed infiltrates in the right base and patient was placed on 100% nonrebreather mask.  He was transitioned back to step down.  Had goals of care discussion with the patient and family on 08/28/2011 who at that time indicated that he wished to be DO NOT RESUSCITATE/DO NOT INTUBATE but wanted continue medical management.  Patient since then has improved on antibiotics.  Subjective: No events overnight, still on 3L [place], breathing unchanged, eating some, still having diarrhea but better  Objective: Vital signs in last 24 hours: Filed Vitals:   09/02/11 0908 09/02/11 1415 09/02/11 2234 09/03/11 0510  BP:  145/74 171/78 117/74  Pulse:  74 89 88  Temp:  98.6 F (37 C) 98.4 F (36.9 C) 97.7 F (36.5 C)  TempSrc:  Oral Oral Oral  Resp:  22 20 24   Height:      Weight:    59.9 kg (132 lb 0.9 oz)  SpO2: 97% 97% 98% 97%   Weight change: -4.2 kg (-9 lb 4.2 oz)  Intake/Output Summary (Last 24 hours) at 09/03/11 1021 Last data filed at 09/03/11 0513  Gross per 24 hour    Intake    600 ml  Output    530 ml  Net     70 ml    Physical Exam: General: Awake, Oriented, No acute distress. HEENT: EOMI, nasal cannula in place. Neck: Supple CV: S1 and S2 Lungs: Coarse breath sounds bilaterally, conducted upper airway sounds Abdomen: Soft, Nontender, distended, high pitched bowel sounds. Ext: Good pulses. Trace edema.  Lab Results:  Basename 09/03/11 0503 09/02/11 0508  NA 142 142  K 2.8* 2.8*  CL 114* 115*  CO2 17* 16*  GLUCOSE 98 103*  BUN 22 24*  CREATININE 1.49* 1.59*  CALCIUM 8.5 8.8  MG -- --  PHOS -- --   No results found for this basename: AST:2,ALT:2,ALKPHOS:2,BILITOT:2,PROT:2,ALBUMIN:2 in the last 72 hours No results found for this basename: LIPASE:2,AMYLASE:2 in the last 72 hours  Basename 09/03/11 0503 09/02/11 0508  WBC 6.7 7.8  NEUTROABS -- --  HGB 9.3* 9.4*  HCT 30.2* 29.2*  MCV 92.9 92.7  PLT 119* 129*   No results found for this basename: CKTOTAL:3,CKMB:3,CKMBINDEX:3,TROPONINI:3 in the last 72 hours No components found with this basename: POCBNP:3 No results found for this basename: DDIMER:2 in the last 72 hours No results found for this basename: HGBA1C:2 in the last 72 hours No results found for this basename: CHOL:2,HDL:2,LDLCALC:2,TRIG:2,CHOLHDL:2,LDLDIRECT:2 in the last 72 hours No results found for this basename: TSH,T4TOTAL,FREET3,T3FREE,THYROIDAB in the last 72 hours No results found for this basename: VITAMINB12:2,FOLATE:2,FERRITIN:2,TIBC:2,IRON:2,RETICCTPCT:2 in the last 72  hours  Micro Results: Recent Results (from the past 240 hour(s))  CLOSTRIDIUM DIFFICILE BY PCR     Status: Normal   Collection Time   08/24/11  8:32 PM      Component Value Range Status Comment   C difficile by pcr NEGATIVE  NEGATIVE  Final   CULTURE, BLOOD (ROUTINE X 2)     Status: Normal (Preliminary result)   Collection Time   08/28/11  8:15 AM      Component Value Range Status Comment   Specimen Description BLOOD RIGHT ARM   Final     Special Requests     Final    Value: BOTTLES DRAWN AEROBIC AND ANAEROBIC 4CC BOTH BOTTLES   Culture  Setup Time [id number]   Final    Culture     Final    Value:        BLOOD CULTURE RECEIVED NO GROWTH TO DATE CULTURE WILL BE HELD FOR 5 DAYS BEFORE ISSUING A FINAL NEGATIVE REPORT   Report Status PENDING   Incomplete   CULTURE, BLOOD (ROUTINE X 2)     Status: Normal (Preliminary result)   Collection Time   08/28/11  8:30 AM      Component Value Range Status Comment   Specimen Description BLOOD RIGHT HAND   Final    Special Requests     Final    Value: BOTTLES DRAWN AEROBIC AND ANAEROBIC 5CC BOTH BOTTLES   Culture  Setup Time [id number]   Final    Culture     Final    Value:        BLOOD CULTURE RECEIVED NO GROWTH TO DATE CULTURE WILL BE HELD FOR 5 DAYS BEFORE ISSUING A FINAL NEGATIVE REPORT   Report Status PENDING   Incomplete   CULTURE, RESPIRATORY     Status: Normal   Collection Time   08/28/11 11:45 AM      Component Value Range Status Comment   Specimen Description SPUTUM   Final    Special Requests NONE   Final    Gram Stain     Final    Value: NO WBC SEEN     RARE SQUAMOUS EPITHELIAL CELLS PRESENT     NO ORGANISMS SEEN   Culture FEW CANDIDA ALBICANS   Final    Report Status 08/31/2011 FINAL   Final   CLOSTRIDIUM DIFFICILE BY PCR     Status: Normal   Collection Time   08/29/11  7:59 AM      Component Value Range Status Comment   C difficile by pcr NEGATIVE  NEGATIVE  Final     Studies/Results: Dg Abd 1 View  09/03/2011  *RADIOLOGY REPORT*  Clinical Data: Abdominal pain and distention.  ABDOMEN - 1 VIEW  Comparison: 08/28/2011.  Findings: Little change in dilated, gas-filled small bowel and colon.  The cecum is slightly smaller, currently measuring 10.7 cm in maximum diameter.  No gross free peritoneal air.  Stable lower lumbar spine fixation hardware.  Stable thoracolumbar scoliosis and degenerative changes.  Atheromatous arterial calcifications.  IMPRESSION:  1.  Little  change in probable diffuse colon and small bowel ileus. Again, obstruction is less likely. 2.  Mildly improved cecal distention.  Original Report Authenticated By: Darrol Angel, M.D.    Medications: I have reviewed the patient's current medications. Scheduled Meds:    . antiseptic oral rinse  15 mL Mouth Rinse BID  . aspirin EC  81 mg Oral Daily  . citalopram  20 mg Oral  Daily  . famotidine  20 mg Oral Daily  . Flora-Q  1 capsule Oral Daily  . heparin subcutaneous  5,000 Units Subcutaneous Q8H  . levofloxacin  250 mg Oral Daily  . levofloxacin  500 mg Oral Once  . potassium chloride  10 mEq Intravenous Q1 Hr x 4  . potassium chloride  40 mEq Per Tube TID  . potassium chloride  40 mEq Oral Once  . sodium chloride  3 mL Intravenous Q12H  . tiotropium  18 mcg Inhalation Daily  . vitamin A & D      . DISCONTD: calcitRIOL  0.25 mcg Oral Daily  . DISCONTD: cefTAZidime (FORTAZ)  IV  2 g Intravenous Q12H  . DISCONTD: clindamycin (CLEOCIN) IV  600 mg Intravenous Q8H  . DISCONTD: feeding supplement  1 Container Oral QAC supper  . DISCONTD: levofloxacin  500 mg Oral Daily  . DISCONTD: potassium chloride  40 mEq Per Tube BID  . DISCONTD: potassium chloride  40 mEq Oral Daily   Continuous Infusions:   PRN Meds:.acetaminophen, acetaminophen, albuterol, ALPRAZolam, alum & mag hydroxide-simeth, loperamide, morphine, ondansetron (ZOFRAN) IV, ondansetron, traMADol-acetaminophen  Assessment/Plan: Consultants:  Dr. Yancey Flemings, Gastroenterology, signed off. Dental surgery - Dr. Kristin Bruins Dr. Tyson Alias, pulmonary critical care, signed off. Physical therapy Speech therapy Antibiotics:  Rocephin 08/18/11--->08/19/11.  Flagyl 08/18/11--->08/19/11  Zosyn 08/18/11--->08/18/11, Restarted 08/20/11----> 08/25/2011 Cipro 08/18/11--->08/18/11  Vancomycin 08/22/11--->08/25/2011, Restarted 08/28/2011 --->4/25 Augmentin 08/25/2011--->08/28/2011 Ceftazidime 08/28/2011--->4/26 Clindamycin 08/28/2011--->4/26 Levaquin  4/26--->  Acute Respiratory Failure with hypoxia / Presumed healthcare associated pneumonia versus aspiration pneumonia Most recent Xray with improving pneumonia Antibiotics changed to clindamycin, Ceftazidime, and vancomycin on 08/28/2011. Wean off O2 as tolerated. Appreciate palliative care input, for now continue medical management. Define 8 day course of antibiotics from 08/28/2011 to treat for aspiration/healthcare associated pneumonia, DC vancomycin 4/25,DC fortaz/clinda,  transition to levaquin 4/26, continue for 2 more days, cultures negative Decrease oxygen requirements as tolerated. KVO IVF  Abdominal distention / Diarrhea Prolong ileus with distension from acute illness/recent sepsis/hypokalemia Tolerating a diet Kub 4/27 without improvement, if worsens or starts vomiting may need NGT replaced if family agreeable Interms of diarrhea, C. difficile PCR negative on 08/29/2011, 08/24/2011 and 08/19/2011, likely antibiotic associated, add imodium PRN  Dysphagia Evaluated by speech therapy, suspect patient has chronic episodic aspiration. Knowing the patient has aspiration risk, patient started on dysphagia 3 diet with thin liquids.  AKI (acute kidney injury) / CKD (chronic kidney disease) stage 3, GFR 30-59 ml/min  Acute renal failure likely secondary to sepsis and dieuresis.  Renal function worsened on 08/28/2011 likely due to diuresis from Lasix.  Slowly continues to improve. Initially IVF stopped due to concerns for pulmonary edema, resumed 08/22/11.  Patient's saline lock on 08/25/2011.  Continue to hold hyzaar. Stopped IVF  Diarrhea / Sepsis / Hypotension / Leukocytosis / Metabolic acidosis, normal anion gap (NAG) Initially, had Index of suspicion initially high for C. difficile colitis given recent antibiotic use and high white blood cell count, but C. difficile PCR studies were negative. Stool cultures on 08/20/2011 was negative.  Metabolic acidosis likely from sepsis and  bicarbonate losses in the stool which resolved with bicarbonate replacement therapy.  Initially treated empirically with Flagyl until C. difficile studies came back negative and Flagyl was discontinued.  Was also on initially on empiric Rocephin for the possibility of other sources of infection including the lungs and the urinary tract. Unfortunately, urine cultures not checked at the time of admission. Urine cultures sent on 08/20/11, but were negative,  as expected.  CT scan of abdomen showed possible gallbladder inflammation as well as perinephric stranding. Abdominal ultrasound not consistent with cholecystitis so source of infection felt to be pyelonephritis.  Antibiotics broadened to Zosyn on 08/20/11 to cover pneumonia, pyelonephritis and cholecystitis, which since then has been discontinued. Blood cultures negative to date.  GI consult for help with management: HIDA scan ordered - results with no significant findings; GI signed off 08/24/2011  MRI lumbar spine done on 08/22/2011 to rule out diskitis, given h/o back pain and lumbar laminectomy, which was negative.  Orthopantogram performed on 08/23/2011 showed dental abscess and dental surgery consulted. Suspect patient's sepsis may have been from dental abscess.  Suspect patient's diarrhea may have been from antibiotic use.  Dental Abscess  Appreciate input from Dr. Kristin Bruins, Orthopantogram performed on 08/23/2011 showed dental abscess. Patient has been scheduled for scheduled for surgery on 08/29/2011 for extraction of remaining teeth with alveoloplasty and pre-prosthetic surgery.  However the surgery had  to be rescheduled given patient's declining respiratory status and possible ileus. Dental extraction not a good option in his current condition   COPD / Dyspnea / Hypoxia  Wean O2  ANEMIA OF CHRONIC DISEASE  Normocytic anemia, from acute illness and chronic disease  Hemoglobin stable.  On Aranesp as an outpatient.  History of coronary  artery disease/myocardial infarction/Elevated CK-MB  Troponins were negative x 3. Continue aspirin. 2-D echocardiogram on 08/18/2011 showed ejection fraction of 55-60%.   History of Atrial fibrillation / nonsustained ventricular tachycardia  Patient went into A. fib intermittently,  has been severely hypokalemic with sepsis from healthcare associated pneumonia.  May convert to sinus rhythm once acute medical issues are resolved. One episode of nonsustained ventricular tachycardia, likely exacerbated by hypokalemia. Repleting potassium. Patient not a good candidate for anticoagulation at this time  PERIPHERAL VASCULAR DISEASE  Continue aspirin.  SECONDARY HYPERPARATHYROIDISM  Continue calcitriol.  Alzheimer's disease  Appears stable.  Abnormal LFTs  Likely secondary to sepsis, improving.  Transaminases now normal and alkaline phosphatase still abnormal.  Abdominal ultrasound to evaluate gallbladder/biliary tree performed on 08/19/2011. No significant evidence of cholecystitis.  Statin on hold consider resuming statin at discharge.  Elevated brain natriuretic peptide (BNP) level / chronic diastolic CHF  Initially, chest x-ray clear with no evidence of pulmonary edema. Suspect initial elevation of pro-BNP secondary to decreased renal clearance and chronic diastolic dysfunction.  Repeat two-dimensional echocardiogram showed normal LV function, grade I diastolic dysfunction.  Lasix 40 mg IV x1 given on 08/20/2011 for increased chest congestion and was given another dose of IV Lasix on 08/27/2011.   Hyponatremia  Likely secondary to dehydration from GI losses, resolved with gentle IV fluids.  Hypokalemia  Secondary to GI losses, repleting orally and IV. Continue replace as needed.  Left renal mass  Consistent with a complex cyst on ultrasound on 08/19/1998 team.  Will need further evaluation as outpatient, with followup imaging in 3 months.  Enlarged prostate  No complaints of urinary  hesitancy.  Right bundle branch block  Chronic.  Thrombocytopenia Chronic.  Continue to monitor.  Prophylaxis SQ Heparin   Code Status: DNR/DNI, greatly appreciate Dr.Lama's input, comfort priority but continued management of active issues Family Communication: Vincente Liberty [phone]. Disposition Plan:SNF D/w wife and daughter, Barrie Dunker today   LOS: 17 days  Zannie Cove, MD 09/03/2011, 10:21 AM

## 2011-09-03 NOTE — Progress Notes (Signed)
Clinical social worker reviewed patient chart and recommendations for patient to discharge to snf with palliative following. Per chart review, patient has bed at [facility]. Weekday CSW to confirm on Monday with [facility] bed and for palliative services to follow patient.   Catha Gosselin, LCSWA weekend covering csw 09/03/2011 1426pm

## 2011-09-04 LAB — CBC
HCT: 29.3 % — ABNORMAL LOW (ref 39.0–52.0)
Hemoglobin: 9 g/dL — ABNORMAL LOW (ref 13.0–17.0)
MCH: 28.6 pg (ref 26.0–34.0)
MCV: 93 fL (ref 78.0–100.0)
Platelets: 101 10*3/uL — ABNORMAL LOW (ref 150–400)
RBC: 3.15 MIL/uL — ABNORMAL LOW (ref 4.22–5.81)

## 2011-09-04 LAB — BASIC METABOLIC PANEL
BUN: 19 mg/dL (ref 6–23)
CO2: 16 mEq/L — ABNORMAL LOW (ref 19–32)
Calcium: 8.8 mg/dL (ref 8.4–10.5)
Creatinine, Ser: 1.44 mg/dL — ABNORMAL HIGH (ref 0.50–1.35)
Glucose, Bld: 102 mg/dL — ABNORMAL HIGH (ref 70–99)

## 2011-09-04 MED ORDER — SIMETHICONE 80 MG PO CHEW: 80.0000 mg | CHEWABLE_TABLET | Freq: Four times a day (QID) | ORAL | Status: DC

## 2011-09-04 NOTE — Progress Notes (Signed)
ISubjective: No events overnight, still on 3L [place], breathing unchanged, feels tired today, hasnt eaten breakfast yet, still having diarrhea as of yesterday, no diarrhea this am so far, no vomiting  Objective: Vital signs in last 24 hours: Filed Vitals:   09/03/11 1835 09/03/11 2105 09/04/11 0300 09/04/11 0900  BP:  150/63 182/63   Pulse:  88 77   Temp:  98.1 F (36.7 C) 98 F (36.7 C)   TempSrc:  Oral Oral   Resp:  20 18   Height:      Weight:      SpO2: 98% 96% 97% 99%   Weight change:   Intake/Output Summary (Last 24 hours) at 09/04/11 1053 Last data filed at 09/04/11 0600  Gross per 24 hour  Intake    450 ml  Output   1750 ml  Net  -1300 ml    Physical Exam: General: Awake, Oriented, No acute distress. HEENT: EOMI, nasal cannula in place. Neck: Supple CV: S1 and S2 Lungs: Coarse breath sounds bilaterally, conducted upper airway sounds, slightly decreased at bases Abdomen: Soft, Nontender, distended, high pitched bowel sounds. Ext: Good pulses. Trace edema.  Lab Results:  Basename 09/04/11 0432 09/03/11 0503  NA 142 142  K 3.6 2.8*  CL 117* 114*  CO2 16* 17*  GLUCOSE 102* 98  BUN 19 22  CREATININE 1.44* 1.49*  CALCIUM 8.8 8.5  MG -- --  PHOS -- --   No results found for this basename: AST:2,ALT:2,ALKPHOS:2,BILITOT:2,PROT:2,ALBUMIN:2 in the last 72 hours No results found for this basename: LIPASE:2,AMYLASE:2 in the last 72 hours  Basename 09/04/11 0432 09/03/11 0503  WBC 7.9 6.7  NEUTROABS -- --  HGB 9.0* 9.3*  HCT 29.3* 30.2*  MCV 93.0 92.9  PLT 101* 119*   No results found for this basename: CKTOTAL:3,CKMB:3,CKMBINDEX:3,TROPONINI:3 in the last 72 hours No components found with this basename: POCBNP:3 No results found for this basename: DDIMER:2 in the last 72 hours No results found for this basename: HGBA1C:2 in the last 72 hours No results found for this basename: CHOL:2,HDL:2,LDLCALC:2,TRIG:2,CHOLHDL:2,LDLDIRECT:2 in the last 72 hours No results  found for this basename: TSH,T4TOTAL,FREET3,T3FREE,THYROIDAB in the last 72 hours No results found for this basename: VITAMINB12:2,FOLATE:2,FERRITIN:2,TIBC:2,IRON:2,RETICCTPCT:2 in the last 72 hours  Micro Results: Recent Results (from the past 240 hour(s))  CULTURE, BLOOD (ROUTINE X 2)     Status: Normal   Collection Time   08/28/11  8:15 AM      Component Value Range Status Comment   Specimen Description BLOOD RIGHT ARM   Final    Special Requests     Final    Value: BOTTLES DRAWN AEROBIC AND ANAEROBIC 4CC BOTH BOTTLES   Culture  Setup Time [id number]   Final    Culture NO GROWTH 5 DAYS   Final    Report Status 09/03/2011 FINAL   Final   CULTURE, BLOOD (ROUTINE X 2)     Status: Normal   Collection Time   08/28/11  8:30 AM      Component Value Range Status Comment   Specimen Description BLOOD RIGHT HAND   Final    Special Requests     Final    Value: BOTTLES DRAWN AEROBIC AND ANAEROBIC 5CC BOTH BOTTLES   Culture  Setup Time [id number]   Final    Culture NO GROWTH 5 DAYS   Final    Report Status 09/03/2011 FINAL   Final   CULTURE, RESPIRATORY     Status: Normal   Collection Time  08/28/11 11:45 AM      Component Value Range Status Comment   Specimen Description SPUTUM   Final    Special Requests NONE   Final    Gram Stain     Final    Value: NO WBC SEEN     RARE SQUAMOUS EPITHELIAL CELLS PRESENT     NO ORGANISMS SEEN   Culture FEW CANDIDA ALBICANS   Final    Report Status 08/31/2011 FINAL   Final   CLOSTRIDIUM DIFFICILE BY PCR     Status: Normal   Collection Time   08/29/11  7:59 AM      Component Value Range Status Comment   C difficile by pcr NEGATIVE  NEGATIVE  Final     Studies/Results: Dg Abd 1 View  09/03/2011  *RADIOLOGY REPORT*  Clinical Data: Abdominal pain and distention.  ABDOMEN - 1 VIEW  Comparison: 08/28/2011.  Findings: Little change in dilated, gas-filled small bowel and colon.  The cecum is slightly smaller, currently measuring 10.7 cm in maximum  diameter.  No gross free peritoneal air.  Stable lower lumbar spine fixation hardware.  Stable thoracolumbar scoliosis and degenerative changes.  Atheromatous arterial calcifications.  IMPRESSION:  1.  Little change in probable diffuse colon and small bowel ileus. Again, obstruction is less likely. 2.  Mildly improved cecal distention.  Original Report Authenticated By: Darrol Angel, M.D.    Medications: I have reviewed the patient's current medications. Scheduled Meds:    . antiseptic oral rinse  15 mL Mouth Rinse BID  . aspirin EC  81 mg Oral Daily  . citalopram  20 mg Oral Daily  . famotidine  20 mg Oral Daily  . Flora-Q  1 capsule Oral Daily  . heparin subcutaneous  5,000 Units Subcutaneous Q8H  . levofloxacin  250 mg Oral Daily  . potassium chloride  40 mEq Per Tube TID  . sodium chloride  3 mL Intravenous Q12H  . tiotropium  18 mcg Inhalation Daily   Continuous Infusions:   PRN Meds:.acetaminophen, acetaminophen, albuterol, ALPRAZolam, alum & mag hydroxide-simeth, loperamide, morphine, ondansetron (ZOFRAN) IV, ondansetron, traMADol-acetaminophen  Assessment/Plan: Consultants:  Dr. Yancey Flemings, Gastroenterology, signed off. Dental surgery - Dr. Kristin Bruins Dr. Tyson Alias, pulmonary critical care, signed off. Physical therapy Speech therapy Antibiotics:  Rocephin 08/18/11--->08/19/11.  Flagyl 08/18/11--->08/19/11  Zosyn 08/18/11--->08/18/11, Restarted 08/20/11----> 08/25/2011 Cipro 08/18/11--->08/18/11  Vancomycin 08/22/11--->08/25/2011, Restarted 08/28/2011 --->4/25 Augmentin 08/25/2011--->08/28/2011 Ceftazidime 08/28/2011--->4/26 Clindamycin 08/28/2011--->4/26 Levaquin 4/26--->  Acute Respiratory Failure with hypoxia / Presumed healthcare associated pneumonia versus aspiration pneumonia Most recent Xray with improving pneumonia Antibiotics changed to clindamycin, Ceftazidime, and vancomycin on 08/28/2011. Wean off O2 as tolerated. Appreciate palliative care input, for now continue  medical management. Define 8 day course of antibiotics from 08/28/2011 to treat for aspiration/healthcare associated pneumonia, DC vancomycin 4/25,DC fortaz/clinda,  transition to levaquin 4/26, continue for 1 more day, cultures negative Decrease oxygen requirements as tolerated. KVO IVF  Abdominal distention / Diarrhea Prolong ileus with distension from acute illness/recent sepsis/hypokalemia Tolerating a diet Kub 4/27 without improvement, if worsens or starts vomiting may need NGT replaced if family agreeable, KuB in am Interms of diarrhea, C. difficile PCR negative on 08/29/2011, 08/24/2011 and 08/19/2011, likely antibiotic associated, add imodium PRN  Dysphagia Evaluated by speech therapy, suspect patient has chronic episodic aspiration. Started on comfort feeds with known aspiration risk  AKI (acute kidney injury) / CKD (chronic kidney disease) stage 3, GFR 30-59 ml/min  Acute renal failure likely secondary to sepsis and dieuresis.  Renal function  worsened on 08/28/2011 likely due to diuresis from Lasix.  Slowly continues to improve. Initially IVF stopped due to concerns for pulmonary edema, resumed 08/22/11.  Patient's saline lock on 08/25/2011.  Continue to hold hyzaar. Stopped IVF  Diarrhea / Sepsis / Hypotension / Leukocytosis / Metabolic acidosis, normal anion gap (NAG) Initially, had Index of suspicion initially high for C. difficile colitis given recent antibiotic use and high white blood cell count, but C. difficile PCR studies were negative. Stool cultures on 08/20/2011 was negative.  Metabolic acidosis likely from sepsis and bicarbonate losses in the stool which resolved with bicarbonate replacement therapy.  Initially treated empirically with Flagyl until C. difficile studies came back negative and Flagyl was discontinued.  Was also on initially on empiric Rocephin for the possibility of other sources of infection including the lungs and the urinary tract. Unfortunately, urine  cultures not checked at the time of admission. Urine cultures sent on 08/20/11, but were negative, as expected.  CT scan of abdomen showed possible gallbladder inflammation as well as perinephric stranding. Abdominal ultrasound not consistent with cholecystitis so source of infection felt to be pyelonephritis.  Antibiotics broadened to Zosyn on 08/20/11 to cover pneumonia, pyelonephritis and cholecystitis, which since then has been discontinued. Blood cultures negative to date.  GI consult for help with management: HIDA scan ordered - results with no significant findings; GI signed off 08/24/2011  MRI lumbar spine done on 08/22/2011 to rule out diskitis, given h/o back pain and lumbar laminectomy, which was negative.  Orthopantogram performed on 08/23/2011 showed dental abscess and dental surgery consulted. Suspect patient's sepsis may have been from dental abscess.  Suspect patient's diarrhea may have been from antibiotic use.  Dental Abscess  Appreciate input from Dr. Kristin Bruins, Orthopantogram performed on 08/23/2011 showed dental abscess. Patient has been scheduled for scheduled for surgery on 08/29/2011 for extraction of remaining teeth with alveoloplasty and pre-prosthetic surgery.  However the surgery had  to be rescheduled given patient's declining respiratory status and possible ileus. Dental extraction not a good option in his current condition   COPD / Dyspnea / Hypoxia  Wean O2  ANEMIA OF CHRONIC DISEASE  Normocytic anemia, from acute illness and chronic disease  Hemoglobin stable.  On Aranesp as an outpatient.  History of coronary artery disease/myocardial infarction/Elevated CK-MB  Troponins were negative x 3. Continue aspirin. 2-D echocardiogram on 08/18/2011 showed ejection fraction of 55-60%.   History of Atrial fibrillation / nonsustained ventricular tachycardia  Patient went into A. fib intermittently,  has been severely hypokalemic with sepsis from healthcare associated  pneumonia.  May convert to sinus rhythm once acute medical issues are resolved. One episode of nonsustained ventricular tachycardia, likely exacerbated by hypokalemia. Repleting potassium. Patient not a good candidate for anticoagulation at this time  PERIPHERAL VASCULAR DISEASE  Continue aspirin.  SECONDARY HYPERPARATHYROIDISM  Continue calcitriol.  Alzheimer's disease  Appears stable.  Abnormal LFTs  Likely secondary to sepsis, improving.  Transaminases now normal and alkaline phosphatase still abnormal.  Abdominal ultrasound to evaluate gallbladder/biliary tree performed on 08/19/2011. No significant evidence of cholecystitis.  Statin on hold consider resuming statin at discharge.  Elevated brain natriuretic peptide (BNP) level / chronic diastolic CHF  Initially, chest x-ray clear with no evidence of pulmonary edema. Suspect initial elevation of pro-BNP secondary to decreased renal clearance and chronic diastolic dysfunction.  Repeat two-dimensional echocardiogram showed normal LV function, grade I diastolic dysfunction.  Lasix 40 mg IV x1 given on 08/20/2011 for increased chest congestion and was given another  dose of IV Lasix on 08/27/2011.   Hyponatremia  Likely secondary to dehydration from GI losses, resolved with gentle IV fluids.  Hypokalemia  Secondary to GI losses, repleting orally. Continue replace as needed.  Left renal mass  Consistent with a complex cyst on ultrasound on 08/19/1998 team.  Will need further evaluation as outpatient, with followup imaging in 3 months.  Enlarged prostate  No complaints of urinary hesitancy.  Right bundle branch block  Chronic.  Thrombocytopenia Chronic.  Continue to monitor.  Prophylaxis SQ Heparin   Code Status: DNR/DNI, greatly appreciate Dr.Lama's input, comfort priority but continued management of active issues Unfortunately very frail, with prolong ileus, aspiration risk, high risk of quick re-admission unless MOST form  filled Family Communication: Vincente Liberty [phone]. Disposition Plan:SNF   LOS: 18 days  Zannie Cove, MD 09/04/2011, 10:53 AM

## 2011-09-04 NOTE — Progress Notes (Signed)
Subjective: Patient seen and examined, stable, breathing better. Still has diarrhea and abdominal distension.  Objective: Vital signs in last 24 hours: Temp:  [97.4 F (36.3 C)-98.1 F (36.7 C)] 98 F (36.7 C) (04/28 0300) Pulse Rate:  [77-92] 77  (04/28 0300) Resp:  [18-24] 18  (04/28 0300) BP: (140-182)/(42-63) 182/63 mmHg (04/28 0300) SpO2:  [95 %-99 %] 99 % (04/28 0900) Weight change:  Last BM Date: 09/03/11  Intake/Output from previous day: 04/27 0701 - 04/28 0700 In: 450 [P.O.:450] Out: 1750 [Urine:950; Stool:800]     Physical Exam: HEENT: Atraumatic, normocephalic Neck: Supple Chest : Clear to auscultation  Heart : S1 S2 Regular, no murmurs Abdomen: Soft, distended, non tender to palpation Ext : No cyanosis, clubbing, edema   Lab Results: Basic Metabolic Panel:  Basename 09/04/11 0432 09/03/11 0503  NA 142 142  K 3.6 2.8*  CL 117* 114*  CO2 16* 17*  GLUCOSE 102* 98  BUN 19 22  CREATININE 1.44* 1.49*  CALCIUM 8.8 8.5  MG -- --  PHOS -- --   CBC:  Basename 09/04/11 0432 09/03/11 0503  WBC 7.9 6.7  NEUTROABS -- --  HGB 9.0* 9.3*  HCT 29.3* 30.2*  MCV 93.0 92.9  PLT 101* 119*    Recent Results (from the past 240 hour(s))  CULTURE, BLOOD (ROUTINE X 2)     Status: Normal   Collection Time   08/28/11  8:15 AM      Component Value Range Status Comment   Specimen Description BLOOD RIGHT ARM   Final    Special Requests     Final    Value: BOTTLES DRAWN AEROBIC AND ANAEROBIC 4CC BOTH BOTTLES   Culture  Setup Time [id number]   Final    Culture NO GROWTH 5 DAYS   Final    Report Status 09/03/2011 FINAL   Final   CULTURE, BLOOD (ROUTINE X 2)     Status: Normal   Collection Time   08/28/11  8:30 AM      Component Value Range Status Comment   Specimen Description BLOOD RIGHT HAND   Final    Special Requests     Final    Value: BOTTLES DRAWN AEROBIC AND ANAEROBIC 5CC BOTH BOTTLES   Culture  Setup Time [id number]   Final    Culture NO GROWTH 5 DAYS    Final    Report Status 09/03/2011 FINAL   Final   CULTURE, RESPIRATORY     Status: Normal   Collection Time   08/28/11 11:45 AM      Component Value Range Status Comment   Specimen Description SPUTUM   Final    Special Requests NONE   Final    Gram Stain     Final    Value: NO WBC SEEN     RARE SQUAMOUS EPITHELIAL CELLS PRESENT     NO ORGANISMS SEEN   Culture FEW CANDIDA ALBICANS   Final    Report Status 08/31/2011 FINAL   Final   CLOSTRIDIUM DIFFICILE BY PCR     Status: Normal   Collection Time   08/29/11  7:59 AM      Component Value Range Status Comment   C difficile by pcr NEGATIVE  NEGATIVE  Final     Studies/Results: Dg Abd 1 View  09/03/2011  *RADIOLOGY REPORT*  Clinical Data: Abdominal pain and distention.  ABDOMEN - 1 VIEW  Comparison: 08/28/2011.  Findings: Little change in dilated, gas-filled small bowel and colon.  The cecum  is slightly smaller, currently measuring 10.7 cm in maximum diameter.  No gross free peritoneal air.  Stable lower lumbar spine fixation hardware.  Stable thoracolumbar scoliosis and degenerative changes.  Atheromatous arterial calcifications.  IMPRESSION:  1.  Little change in probable diffuse colon and small bowel ileus. Again, obstruction is less likely. 2.  Mildly improved cecal distention.  Original Report Authenticated By: Darrol Angel, M.D.    Medications: Scheduled Meds:   . antiseptic oral rinse  15 mL Mouth Rinse BID  . aspirin EC  81 mg Oral Daily  . citalopram  20 mg Oral Daily  . famotidine  20 mg Oral Daily  . Flora-Q  1 capsule Oral Daily  . heparin subcutaneous  5,000 Units Subcutaneous Q8H  . levofloxacin  250 mg Oral Daily  . potassium chloride  40 mEq Per Tube TID  . sodium chloride  3 mL Intravenous Q12H  . tiotropium  18 mcg Inhalation Daily  . DISCONTD: simethicone  80 mg Oral QID   Continuous Infusions:  PRN Meds:.acetaminophen, acetaminophen, albuterol, ALPRAZolam, alum & mag hydroxide-simeth, loperamide, morphine,  ondansetron (ZOFRAN) IV, ondansetron, traMADol-acetaminophen  Assessment/Plan:  Aspiration Pneumonia  Diarrhea  Dysphagia  Acute on CKD  Dental Abscess  COPD  Anemia of chronic disease  H/o Atrial Fibrillation  Alzheimer's Dementia  Continue comfort measures, Roxanol for dyspnea. MOST form needs to be filled out prior to discharge. Family want to wait for few days before filling out MOST form.    LOS: 18 days   [facility] Pager: [phone] 09/04/2011, 12:27 PM

## 2011-09-05 ENCOUNTER — Encounter: Payer: Medicare Other | Admitting: Endocrinology

## 2011-09-05 LAB — CBC
MCH: 28.9 pg (ref 26.0–34.0)
MCHC: 30.3 g/dL (ref 30.0–36.0)
MCV: 95.1 fL (ref 78.0–100.0)
Platelets: 118 10*3/uL — ABNORMAL LOW (ref 150–400)
RDW: 15.2 % (ref 11.5–15.5)

## 2011-09-05 LAB — BASIC METABOLIC PANEL
BUN: 19 mg/dL (ref 6–23)
Calcium: 9 mg/dL (ref 8.4–10.5)
Creatinine, Ser: 1.45 mg/dL — ABNORMAL HIGH (ref 0.50–1.35)
GFR calc Af Amer: 50 mL/min — ABNORMAL LOW (ref >90)

## 2011-09-05 MED ORDER — MORPHINE SULFATE (CONCENTRATE) 20 MG/ML PO SOLN: 2.5000 mg | ORAL | Status: AC | PRN

## 2011-09-05 MED ORDER — DIPHENOXYLATE-ATROPINE 2.5-0.025 MG PO TABS: 1.0000 | ORAL_TABLET | Freq: Two times a day (BID) | ORAL | Status: AC | PRN

## 2011-09-05 MED ORDER — FLORA-Q PO CAPS: 1.0000 | ORAL_CAPSULE | Freq: Every day | ORAL | Status: AC

## 2011-09-05 MED ORDER — POTASSIUM CHLORIDE 20 MEQ/15ML (10%) PO LIQD: 40.0000 meq | Freq: Two times a day (BID) | ORAL | Status: AC

## 2011-09-05 MED ORDER — FAMOTIDINE 20 MG PO TABS: 20.0000 mg | ORAL_TABLET | Freq: Every day | ORAL | Status: AC

## 2011-09-05 MED ORDER — ACETAMINOPHEN 325 MG PO TABS: 650.0000 mg | ORAL_TABLET | Freq: Four times a day (QID) | ORAL | Status: AC | PRN

## 2011-09-05 MED ORDER — ALPRAZOLAM 0.25 MG PO TABS: 0.2500 mg | ORAL_TABLET | Freq: Three times a day (TID) | ORAL | Status: AC | PRN

## 2011-09-05 NOTE — Progress Notes (Signed)
Physical Therapy Treatment Patient Details Name: Brandon [name] MRN: [id number] DOB: 1926-01-29 Today's Date: 09/05/2011 Time: 5366-4403 PT Time Calculation (min): 34 min  PT Assessment / Plan / Recommendation Comments on Treatment Session  78 y.o. male demonstrating improvement with bed mobility and transfers, would likely be able to ambulate farther but flexiseal leakage limited ambulation.     Follow Up Recommendations  Skilled nursing facility    Equipment Recommendations  Defer to next venue    Frequency Min 3X/week   Plan Discharge plan remains appropriate    Precautions / Restrictions Precautions Precautions: None   Pertinent Vitals/Pain *no c/o pain SaO2 96% on 2L O2 during activity**    Mobility  Bed Mobility Bed Mobility: Supine to Sit Supine to Sit: 3: Mod assist Supine to Sit: Patient Percentage: 60% Details for Bed Mobility Assistance: assist to elevate trunk Transfers Transfers: Sit to Stand;Stand to Sit Sit to Stand: 4: Min assist;With upper extremity assist;From bed Sit to Stand: Patient Percentage: 80% Stand to Sit: 4: Min guard;With upper extremity assist;To chair/3-in-1 Stand to Sit: Patient Percentage: 90% Details for Transfer Assistance: VCs for hand placement Ambulation/Gait Ambulation/Gait Assistance: 4: Min assist Ambulation/Gait: Patient Percentage: 90 Ambulation Distance (Feet): 5 Feet Assistive device: Rolling walker Ambulation/Gait Assistance Details: distance limited due to significant leakage of BM from flexiseal in standing, RN unavailable but left message with Diplomatic Services operational officer to notify RN of leakage Gait Pattern: Step-to pattern;Decreased step length - right;Decreased step length - left    Exercises     PT Goals Acute Rehab PT Goals PT Goal Formulation: With patient/family Time For Goal Achievement: 09/09/11 Potential to Achieve Goals: Good Pt will go Supine/Side to Sit: with min assist PT Goal: Supine/Side to Sit - Progress:  Progressing toward goal Pt will go Sit to Stand: with supervision PT Goal: Sit to Stand - Progress: Progressing toward goal Pt will go Stand to Sit: with supervision PT Goal: Stand to Sit - Progress: Progressing toward goal Pt will Ambulate: 51 - 150 feet PT Goal: Ambulate - Progress: Other (comment) (ambulation limited due to flexiseal leak)  Visit Information  Last PT Received On: 09/05/11 Assistance Needed: +2 (+2 to manage O2, flexiseal, RW)    Subjective Data  Subjective: Pt stated he wanted to walk.  Pt reported needing to have fequent BMs.  Patient Stated Goal: to go to rehab   Cognition  Overall Cognitive Status: Appears within functional limits for tasks assessed/performed Arousal/Alertness: Awake/alert Behavior During Session: [facility] for tasks performed    Balance  Balance Balance Assessed: Yes Static Sitting Balance Static Sitting - Balance Support: Bilateral upper extremity supported;Feet supported Static Sitting - Level of Assistance: 5: Stand by assistance Static Sitting - Comment/# of Minutes: 6  End of Session PT - End of Session Equipment Utilized During Treatment: Gait belt;Oxygen Activity Tolerance: Patient tolerated treatment well Patient left: in chair;with family/visitor present Nurse Communication: Mobility status    Ralene Bathe Kistler 09/05/2011, 2:05 PM [phone]

## 2011-09-05 NOTE — Progress Notes (Signed)
Speech Language Pathology Dysphagia Treatment Patient Details Name: Brandon [name] MRN: [id number] DOB: 13-Apr-1926 Today's Date: 09/05/2011 Time: 5621-3086 SLP Time Calculation (min): 14 min  Assessment / Plan / Recommendation Clinical Impression  Note pt now is full comfort and has a MOST form completed.  Ongoing aspiration present - which pt confirms has been occuring for "years" and is near baseline.  Provided tips to maximize pt's comfort with intake - to which pt verbalized.  Note 100% of lunch meal consumed today.    CXR 424 indicated imroved but persistent right upper lobe pna.  SlP to sign off as all education completed at this time.  SlP contact number given to pt for family use if needed.  Thanks!    Diet Recommendation  Continue with Current Diet: Regular;Thin liquid (FOR COMFORT)    SLP Plan All goals met   Pertinent Vitals/Pain Stable   Swallowing Goals  SLP Swallowing Goals Swallow Study Goal #2 - Progress: Met  General Temperature Spikes Noted: No Respiratory Status: Supplemental O2 delivered via (comment) Oral Cavity - Dentition: Poor condition Patient Positioning: Upright in bed   Dysphagia Treatment Treatment focused on: Patient/family/caregiver education Family/Caregiver Educated: Pt provided verbal information regarding comfort feeds:   as family not present, also gave info to pt in writing Treatment Methods/Modalities: Skilled observation Patient observed directly with PO's: Yes Feeding: Able to feed self (currently able to self feed) Liquids provided via: Straw Pharyngeal Phase Signs & Symptoms: Delayed cough   Donavan Burnet, MS [facility] SLP [phone]

## 2011-09-05 NOTE — Discharge Summary (Addendum)
Physician Discharge Summary  Patient ID: Brandon [name] MRN: [id number] DOB/AGE: 1925/05/11 78 y.o.  Admit date: 08/17/2011 Discharge date: 09/06/2011  Primary Care Physician:  Romero Belling, MD, MD  Discharge Diagnoses:   1. Sepsis 2. Acute hypoxic respiratory failure 3. healthcare associated pneumonia versus aspiration pneumonia 4. dental abscess/Chronic apical periodontitis 5. Dysphagia 6. prolonged ileus with diarrhea 7. Alzheimer's dementia 8. acute renal failure on chronic kidney disease stage III 9. metabolic acidosis secondary to diarrhea 10. Paroxysmal atrial fibrillation 11. Left renal mass 12. Severe hypokalemia presumed from diarrhea and ileus 13. COPD 14. History of CAD 15. Anemia secondary to acute illness and chronic disease 16. Enlarged prostate   Medication List  As of 09/06/2011 10:13 AM   STOP taking these medications         amLODipine 5 MG tablet      atorvastatin 80 MG tablet      calcitRIOL 0.25 MCG capsule      losartan-hydrochlorothiazide 100-25 MG per tablet      multivitamin tablet      omeprazole 20 MG capsule      oxyCODONE-acetaminophen 5-325 MG per tablet      VITAMIN D PO         TAKE these medications         acetaminophen 325 MG tablet   Commonly known as: TYLENOL   Take 2 tablets (650 mg total) by mouth every 6 (six) hours as needed (or Fever >/= 101).      ALPRAZolam 0.25 MG tablet   Commonly known as: XANAX   Take 1 tablet (0.25 mg total) by mouth 3 (three) times daily as needed. Anxiety      aspirin 81 MG tablet   Take 81 mg by mouth daily.      citalopram 20 MG tablet   Commonly known as: CELEXA   Take 1 tablet (20 mg total) by mouth daily.      diphenoxylate-atropine 2.5-0.025 MG per tablet   Commonly known as: LOMOTIL   Take 1 tablet by mouth 2 (two) times daily as needed for diarrhea or loose stools.      famotidine 20 MG tablet   Commonly known as: PEPCID   Take 1 tablet (20 mg total) by mouth daily.        Flora-Q Caps   Take 1 capsule by mouth daily.      morphine 20 MG/ML concentrated solution   Commonly known as: ROXANOL   Take 0.13 mLs (2.6 mg total) by mouth every 2 (two) hours as needed for pain (pain and shortness of breath).      potassium chloride 20 MEQ/15ML (10%) solution   Place 30 mLs (40 mEq total) into feeding tube 2 (two) times daily.      Tamsulosin HCl 0.4 MG Caps   Commonly known as: FLOMAX   Take 1 capsule (0.4 mg total) by mouth daily after breakfast.      tiotropium 18 MCG inhalation capsule   Commonly known as: SPIRIVA   Place 1 capsule (18 mcg total) into inhaler and inhale daily.           Disposition and Follow-up:  Palliative care follow up at SNF  Consults:  1. Dr.Feinstein, pulmonary critical care medicine 2. Dr. Derenda Mis and Dr. Mauro Kaufmann with palliative medicine   Brief H and P: History of Present Illness:  This is an 78 year old gentleman with a past medical history that is extensive including chronic renal failure, hypertension, Alzheimer's disease,  COPD, atrial fibrillation and many other chronic medical problems who despite these has maintained a high functional status at home and lives independently with his wife. He was brought to the emergency department here at [facility] yesterday by his wife after she says he had a 3-4 day history of loose runny stools, watery, decreased oral intake, worsening weakness and fatigue. Workup in the ED was done, given lomotil and patient was discharged with symptomatic care only. He returns today to the emergency department with continued diarrhea bed with new onset shortness of breath dyspnea and hypotension. His wife is not available at bedside and patient is unable to provide complete history. From the records he was recently treated for bronchitis, gets frequent pred paks for COPD flares and baseline CAD.   Hospital Course:  Consultants:  Dr. Yancey Flemings, Gastroenterology, signed off.  Dental surgery -  Dr. Kristin Bruins  Dr. Tyson Alias, pulmonary critical care, signed off.  Physical therapy  Speech therapy Antibiotics:  Rocephin 08/18/11--->08/19/11.  Flagyl 08/18/11--->08/19/11  Zosyn 08/18/11--->08/18/11, Restarted 08/20/11----> 08/25/2011  Cipro 08/18/11--->08/18/11  Vancomycin 08/22/11--->08/25/2011, Restarted 08/28/2011 --->4/25  Augmentin 08/25/2011--->08/28/2011  Ceftazidime 08/28/2011--->4/26  Clindamycin 08/28/2011--->4/26  Levaquin 4/26--->4/29  Acute Respiratory Failure with hypoxia / Presumed healthcare associated pneumonia versus aspiration pneumonia  Had acute hypoxic respiratory failure through this prolonged hospitalization, x-rays revealed bilateral lower lobe pneumonia consistent with healthcare associated versus aspiration. He has been treated with multiple different courses of antibiotics initially for sepsis, dental abscess , his antibiotics were re-escalated on 4/21, he has completed an eight-day course of antibiotics since then originally IV vancomycin, ceftazidime and clindamycin, these were switched over to oral Levaquin on 426, today is the last day of his antibiotics hence will be discontinued after today's dose Most recent Xray with improving pneumonia   Wean off O2 as tolerated.  He has been followed by palliative care medicine through this hospitalization and this point the focus primarily is comfort and to let nature take its course  Decrease oxygen requirements as tolerated, currently on 2 L of oxygen via nasal cannula  Abdominal distention / Diarrhea  Prolong ileus with distension from acute illness/recent sepsis/hypokalemia and antibiotic associated diarrhea Tolerating a diet  Kub 4/27 without improvement, still has ileus on KUB with moderate amount of persistent diarrhea, since comfort is the main focus we decided not to place an NG tube again. He has had an NG tube placed earlier during this hospitalization for ileus which patient pulled out by himself. At this point he is  tolerating comfort feeds by mouth without any vomiting.  Interms of diarrhea, C. difficile PCR negative on 08/29/2011, 08/24/2011 and 08/19/2011, likely antibiotic associated, continue imodium PRN  Dysphagia  Evaluated by speech therapy, suspect patient has chronic episodic aspiration, recommended starting a dysphagia 3 diet which was subsequently switched to comfort feeds with known aspiration risk    AKI (acute kidney injury) / CKD (chronic kidney disease) stage 3, GFR 30-59 ml/min  Acute renal failure likely secondary to sepsis and dieuresis, Improved back to baseline   Renal function worsened on 08/28/2011 likely due to diuresis from Lasix. Slowly continues to improve.  Initially IVF stopped due to concerns for pulmonary edema, resumed 08/22/11. Patient's saline lock on 08/25/2011.     Dental Abscess              Seen by Dr. Kristin Bruins, Orthopantogram performed on 08/23/2011 showed dental abscess.  Patient has been scheduled for scheduled for surgery on 08/29/2011 for extraction of remaining teeth with alveoloplasty and  pre-prosthetic surgery. However the surgery had to be rescheduled given patient's declining respiratory status and possible ileus.  Dental extraction not a good option in his current condition  History of coronary artery disease/myocardial infarction/Elevated CK-MB  Troponins were negative x 3. Continue aspirin.  2-D echocardiogram on 08/18/2011 showed ejection fraction of 55-60%.  History of Atrial fibrillation / nonsustained ventricular tachycardia  Patient went into A. fib intermittently, has been severely hypokalemic with sepsis from healthcare associated pneumonia.  One episode of nonsustained ventricular tachycardia, likely exacerbated by hypokalemia. Repleting potassium.               Patient not a good candidate for anticoagulation Alzheimer's disease  Appears stable. Left renal mass  Consistent with a complex cyst on ultrasound on 08/19/1998   Code Status:  DNR/DNI, Going to SNF, will benefit from physical rehabilitation and with palliative care follow up Also has MOST form filled by palliative services  Family Communication: Vincente Liberty [phone].      Time spent on Discharge:  Signed: Arjen Deringer [facility]  09/06/2011, 10:13 AM

## 2011-09-05 NOTE — Progress Notes (Signed)
Foley catheter removed this morning and patient has only voided 50 ml. Bladder scan performed showing of fluid in his bladder. Patient says that he would like to try to go again on his own. Continuing to monitor. Marisa Sprinkles RN

## 2011-09-06 LAB — CBC
MCH: 28.8 pg (ref 26.0–34.0)
MCHC: 30.4 g/dL (ref 30.0–36.0)
MCV: 94.6 fL (ref 78.0–100.0)
Platelets: 120 10*3/uL — ABNORMAL LOW (ref 150–400)
RDW: 15 % (ref 11.5–15.5)

## 2011-09-06 LAB — BASIC METABOLIC PANEL
CO2: 17 mEq/L — ABNORMAL LOW (ref 19–32)
Calcium: 9.1 mg/dL (ref 8.4–10.5)
Creatinine, Ser: 1.33 mg/dL (ref 0.50–1.35)
GFR calc Af Amer: 55 mL/min — ABNORMAL LOW (ref >90)
GFR calc non Af Amer: 48 mL/min — ABNORMAL LOW (ref >90)
Sodium: 142 mEq/L (ref 135–145)

## 2011-09-06 MED ORDER — TAMSULOSIN HCL 0.4 MG PO CAPS
0.4000 mg | ORAL_CAPSULE | Freq: Every day | ORAL | Status: DC
  Administered 2011-09-06: 0.4 mg via ORAL
  Filled 2011-09-06 (×2): qty 1

## 2011-09-06 MED ORDER — TAMSULOSIN HCL 0.4 MG PO CAPS: 0.4000 mg | ORAL_CAPSULE | Freq: Every day | ORAL | Status: AC

## 2011-09-06 NOTE — Progress Notes (Signed)
Patient is set to discharge to [facility] today. Wife is currently at [facility] completing admission paperwork and awaiting patient's arrival. [facility] called for transport.   Unice Bailey, [place] [phone]   Clinical Social Work Department CLINICAL SOCIAL WORK PLACEMENT NOTE 09/06/2011  Patient:  Brandon [name], Brandon [name]  Account Number:  [id number] Admit date:  08/17/2011  Clinical Social Worker:  Orpah Greek  Date/time:  09/06/2011 11:00 AM  Clinical Social Work is seeking post-discharge placement for this patient at the following level of care:   SKILLED NURSING   (*CSW will update this form in Epic as items are completed)     Patient/family provided with [facility] Department of Clinical Social Work's list of facilities offering this level of care within the geographic area requested by the patient (or if unable, by the patient's family).    Patient/family informed of their freedom to choose among providers that offer the needed level of care, that participate in Medicare, Medicaid or managed care program needed by the patient, have an available bed and are willing to accept the patient.    Patient/family informed of [facility]' ownership interest in [facility], as well as of the fact that they are under no obligation to receive care at this facility.  PASARR submitted to EDS on 09/06/2011 PASARR number received from EDS on 09/06/2011  FL2 transmitted to all facilities in geographic area requested by pt/family on  09/06/2011 FL2 transmitted to all facilities within larger geographic area on   Patient informed that his/her managed care company has contracts with or will negotiate with  certain facilities, including the following:     Patient/family informed of bed offers received:  09/06/2011 Patient chooses bed at [facility] Physician recommends and patient chooses bed at    Patient to be transferred to [facility] on   09/06/2011 Patient to be transferred to facility by [facility]  The following physician request were entered in Epic:   Additional Comments:   Unice Bailey, LCSWA [phone]

## 2011-09-06 NOTE — Progress Notes (Signed)
Subjective: Patient seen and examined, foley catheter out, still unable to urinate. In and out cath done by nursing.  Objective: Vital signs in last 24 hours: Temp:  [97.6 F (36.4 C)-98 F (36.7 C)] 98 F (36.7 C) (04/30 0550) Pulse Rate:  [82-94] 82  (04/30 0550) Resp:  [18] 18  (04/30 0550) BP: (120-176)/(69-81) 176/81 mmHg (04/30 0550) SpO2:  [94 %-98 %] 98 % (04/30 0550) Weight:  [61.6 kg (135 lb 12.9 oz)] 61.6 kg (135 lb 12.9 oz) (04/30 0550) Weight change:  Last BM Date: 09/04/11  Intake/Output from previous day: 04/29 0701 - 04/30 0700 In: 240 [P.O.:240] Out: 1150 [Urine:950; Stool:200]     Physical Exam: HEENT: Atraumatic, normocephalic Neck: Supple Chest : Clear to auscultation  Heart : S1 S2 Regular, no murmurs Abdomen: Soft, distended, non tender to palpation Ext : No cyanosis, clubbing, edema   Lab Results: Basic Metabolic Panel:  Basename 09/06/11 0532 09/05/11 0512  NA 142 144  K 3.2* 3.7  CL 115* 119*  CO2 17* 18*  GLUCOSE 101* 103*  BUN 20 19  CREATININE 1.33 1.45*  CALCIUM 9.1 9.0  MG -- --  PHOS -- --   CBC:  Basename 09/06/11 0532 09/05/11 0512  WBC 5.8 6.7  NEUTROABS -- --  HGB 9.0* 8.8*  HCT 29.6* 29.0*  MCV 94.6 95.1  PLT 120* 118*    Recent Results (from the past 240 hour(s))  CULTURE, BLOOD (ROUTINE X 2)     Status: Normal   Collection Time   08/28/11  8:15 AM      Component Value Range Status Comment   Specimen Description BLOOD RIGHT ARM   Final    Special Requests     Final    Value: BOTTLES DRAWN AEROBIC AND ANAEROBIC 4CC BOTH BOTTLES   Culture  Setup Time [id number]   Final    Culture NO GROWTH 5 DAYS   Final    Report Status 09/03/2011 FINAL   Final   CULTURE, BLOOD (ROUTINE X 2)     Status: Normal   Collection Time   08/28/11  8:30 AM      Component Value Range Status Comment   Specimen Description BLOOD RIGHT HAND   Final    Special Requests     Final    Value: BOTTLES DRAWN AEROBIC AND ANAEROBIC 5CC BOTH  BOTTLES   Culture  Setup Time [id number]   Final    Culture NO GROWTH 5 DAYS   Final    Report Status 09/03/2011 FINAL   Final   CULTURE, RESPIRATORY     Status: Normal   Collection Time   08/28/11 11:45 AM      Component Value Range Status Comment   Specimen Description SPUTUM   Final    Special Requests NONE   Final    Gram Stain     Final    Value: NO WBC SEEN     RARE SQUAMOUS EPITHELIAL CELLS PRESENT     NO ORGANISMS SEEN   Culture FEW CANDIDA ALBICANS   Final    Report Status 08/31/2011 FINAL   Final   CLOSTRIDIUM DIFFICILE BY PCR     Status: Normal   Collection Time   08/29/11  7:59 AM      Component Value Range Status Comment   C difficile by pcr NEGATIVE  NEGATIVE  Final     Studies/Results: No results found.  Medications: Scheduled Meds:    . antiseptic oral rinse  15 mL  Mouth Rinse BID  . aspirin EC  81 mg Oral Daily  . citalopram  20 mg Oral Daily  . famotidine  20 mg Oral Daily  . Flora-Q  1 capsule Oral Daily  . heparin subcutaneous  5,000 Units Subcutaneous Q8H  . levofloxacin  250 mg Oral Daily  . potassium chloride  40 mEq Per Tube TID  . sodium chloride  3 mL Intravenous Q12H  . tiotropium  18 mcg Inhalation Daily   Continuous Infusions:  PRN Meds:.acetaminophen, acetaminophen, albuterol, ALPRAZolam, alum & mag hydroxide-simeth, loperamide, morphine, ondansetron (ZOFRAN) IV, ondansetron, traMADol-acetaminophen  Assessment/Plan: Urinary retention ? BPH Aspiration Pneumonia  Diarrhea  Dysphagia  Acute on CKD  Dental Abscess  COPD  Anemia of chronic disease  H/o Atrial Fibrillation  Alzheimer's Dementia  Will start Flomax for ? BPH, should also help with hypertension. Continue straight  cath for now. Continue comfort measures, Roxanol for dyspnea. MOST form filled. Patient to go to SNF with palliative care follow up    LOS: 20 days   [facility] Pager: [phone] 09/06/2011, 9:16 AM

## 2011-10-08 DEATH — deceased

## 2011-11-16 ENCOUNTER — Encounter: Payer: Medicare Other | Admitting: Physical Medicine and Rehabilitation

## 2013-04-11 IMAGING — CR DG LUMBAR SPINE COMPLETE 4+V
5 series · 5 of 5 positions shown · non-contrast
Comparison: Plain films lumbar spine 04/21/2010.

CLINICAL DATA: Back pain.

LUMBAR SPINE - COMPLETE 4+ VIEW

[t l-spine a.p.]
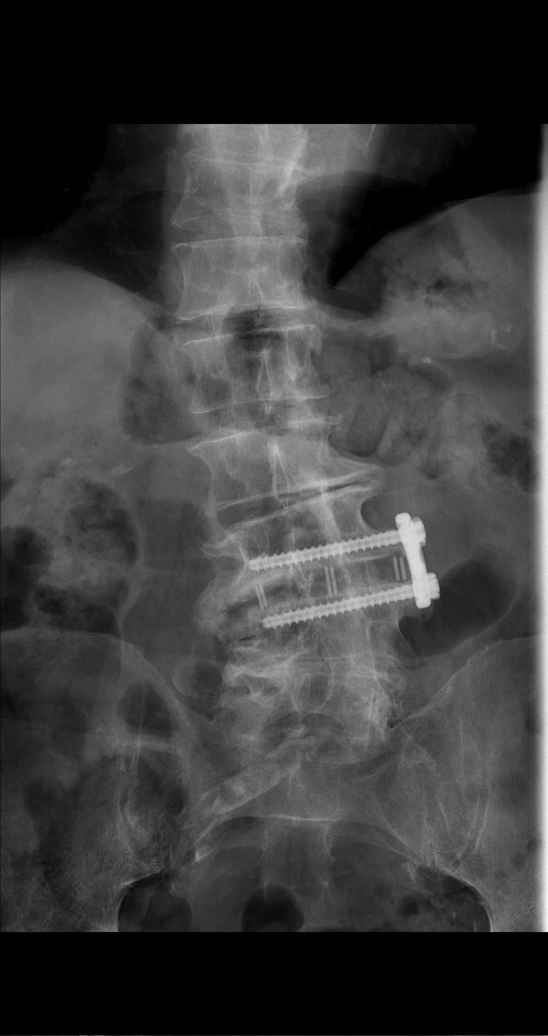

[t l-spine lat]
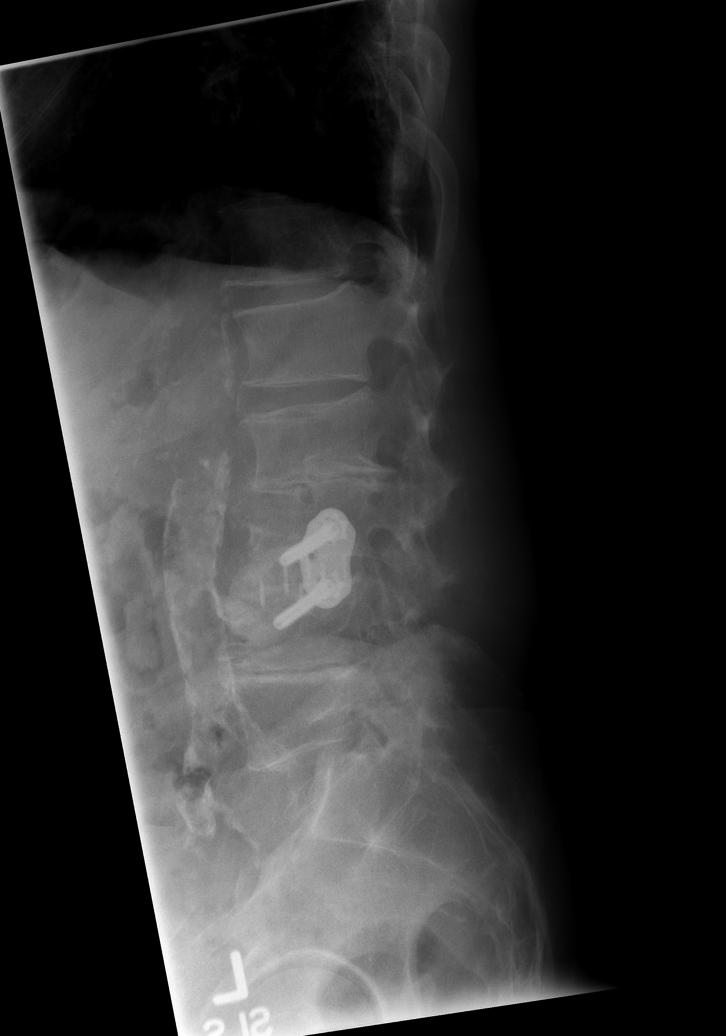

[w l-spine lat (1 of 2)]
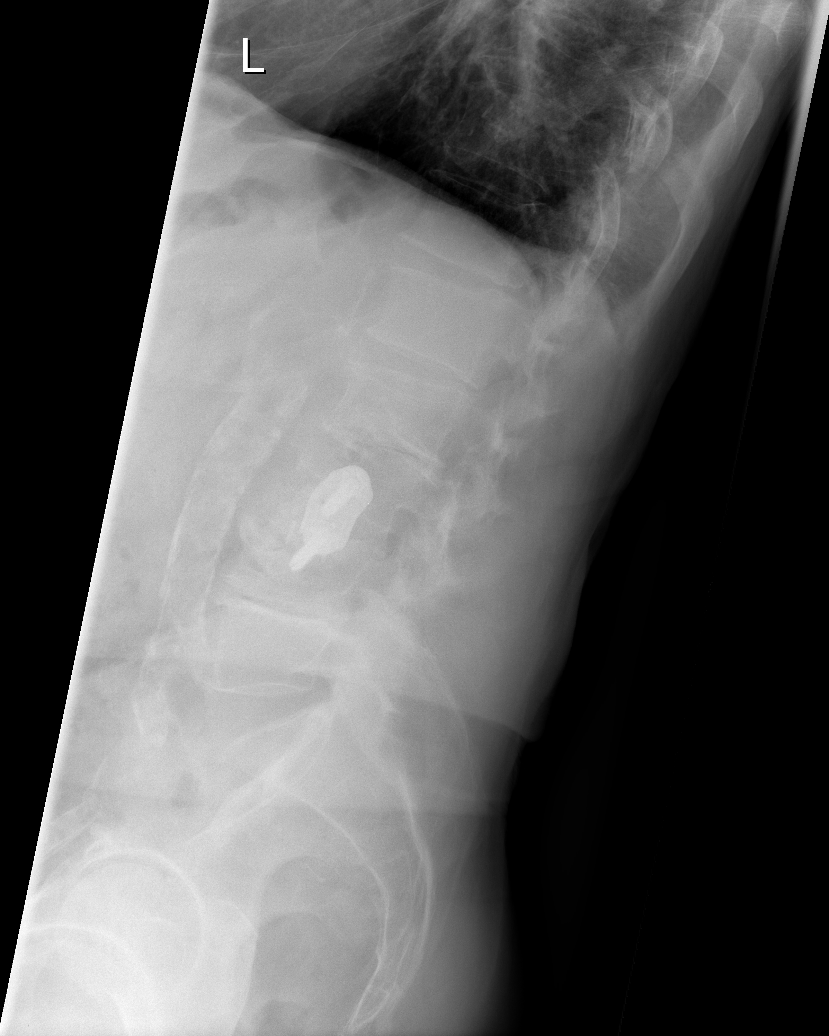

[w l-spine lat *]
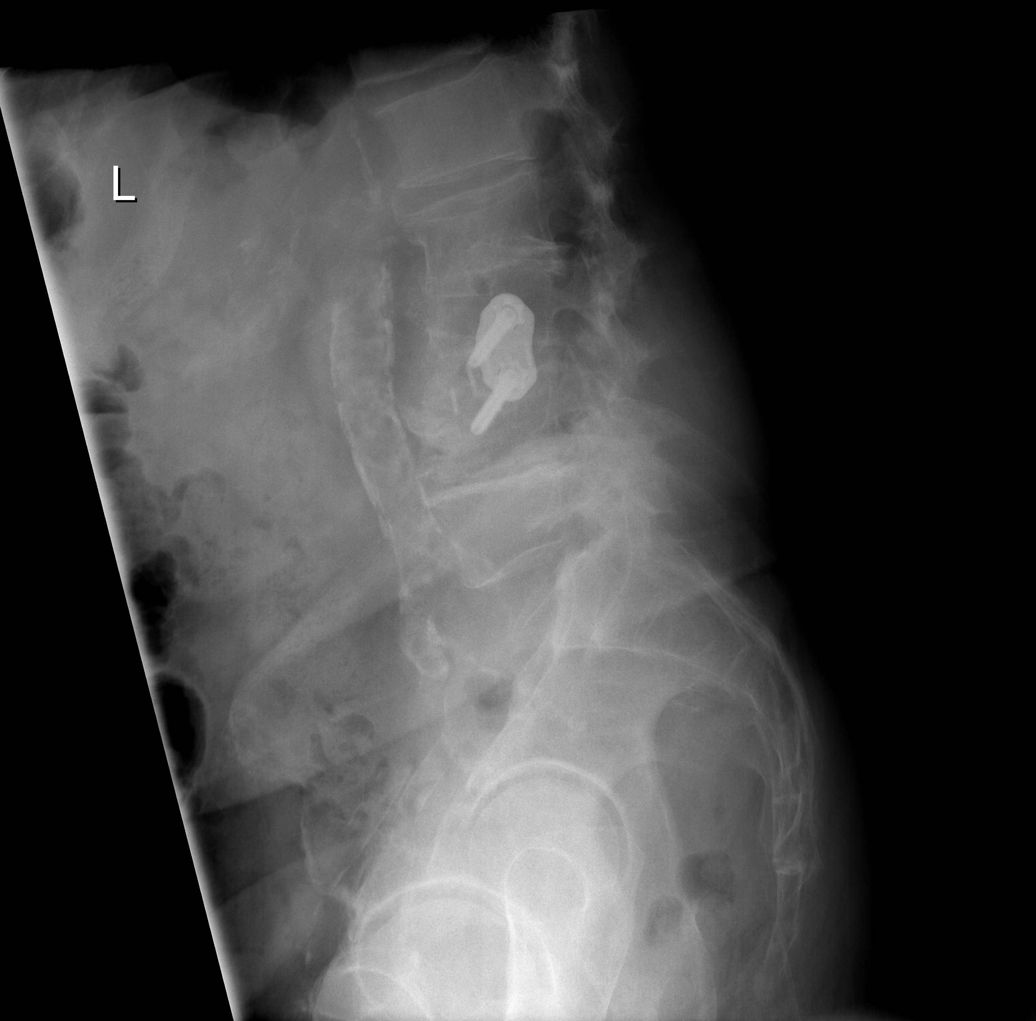

[w l-spine lat (2 of 2)]
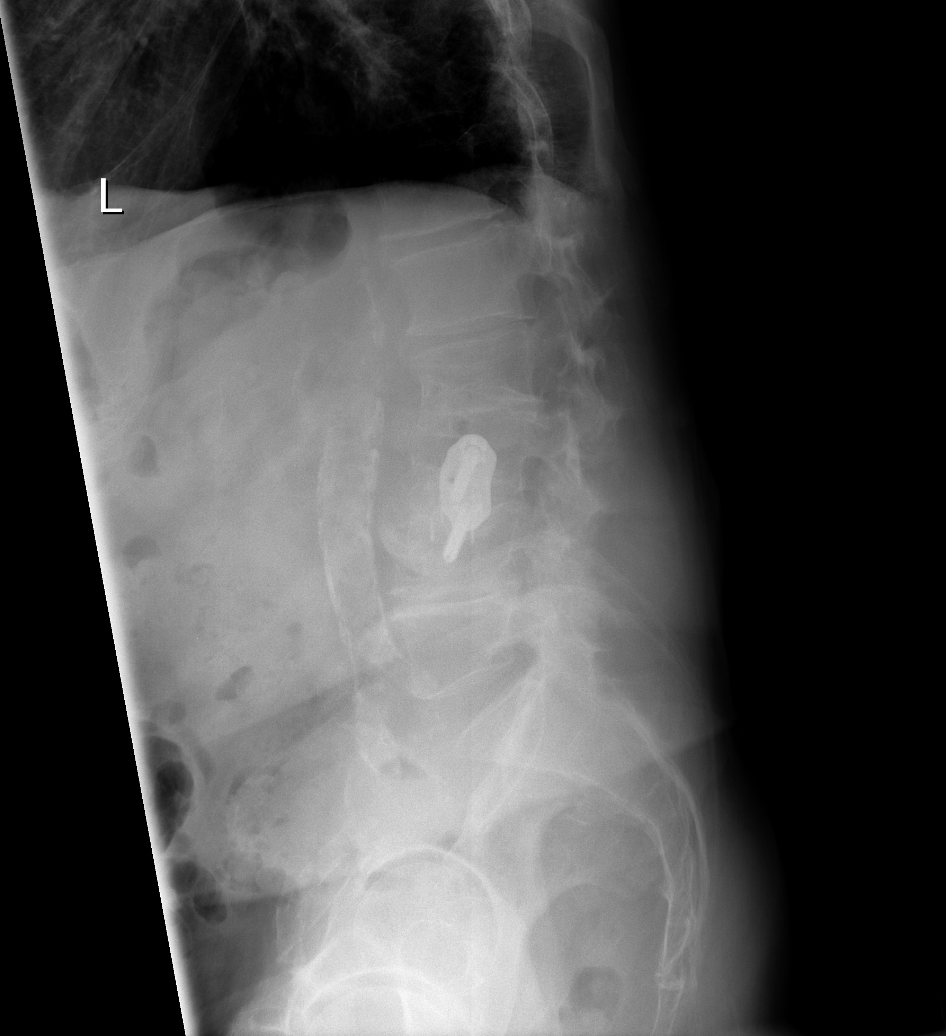

[5 of 5 positions shown; findings below may reference images not displayed]

FINDINGS: Again seen is marked convex left scoliosis.
Postoperative change of L3-4 fusion is also again identified.  The
patient has marked loss of disc space height at L4-5.  There is no
fracture or subluxation.  Advanced facet arthropathy is noted.
IMPRESSION: 1.  No acute finding.
2.  Marked spondylosis.  Status post L3-4 fusion.

## 2015-02-26 ENCOUNTER — Encounter: Payer: Self-pay | Admitting: Internal Medicine

## 2018-02-15 ENCOUNTER — Other Ambulatory Visit: Payer: Self-pay | Admitting: Nurse Practitioner
